# Patient Record
Sex: Male | Born: 1940 | Race: White | Hispanic: No | Marital: Married | Smoking: Former smoker
Health system: Southern US, Community
[De-identification: ages and names within clinical notes are randomized; demographics above are authoritative.]

## PROBLEM LIST (undated history)

## (undated) DIAGNOSIS — N189 Chronic kidney disease, unspecified: Secondary | ICD-10-CM

## (undated) DIAGNOSIS — M1990 Unspecified osteoarthritis, unspecified site: Secondary | ICD-10-CM

## (undated) DIAGNOSIS — Z8719 Personal history of other diseases of the digestive system: Secondary | ICD-10-CM

## (undated) DIAGNOSIS — I2510 Atherosclerotic heart disease of native coronary artery without angina pectoris: Secondary | ICD-10-CM

## (undated) DIAGNOSIS — I509 Heart failure, unspecified: Secondary | ICD-10-CM

## (undated) DIAGNOSIS — I639 Cerebral infarction, unspecified: Secondary | ICD-10-CM

## (undated) DIAGNOSIS — I4819 Other persistent atrial fibrillation: Secondary | ICD-10-CM

## (undated) DIAGNOSIS — C44201 Unspecified malignant neoplasm of skin of unspecified ear and external auricular canal: Secondary | ICD-10-CM

## (undated) DIAGNOSIS — E785 Hyperlipidemia, unspecified: Secondary | ICD-10-CM

## (undated) DIAGNOSIS — Z9989 Dependence on other enabling machines and devices: Secondary | ICD-10-CM

## (undated) DIAGNOSIS — G4733 Obstructive sleep apnea (adult) (pediatric): Secondary | ICD-10-CM

## (undated) DIAGNOSIS — I10 Essential (primary) hypertension: Secondary | ICD-10-CM

## (undated) DIAGNOSIS — I499 Cardiac arrhythmia, unspecified: Secondary | ICD-10-CM

## (undated) HISTORY — DX: Hyperlipidemia, unspecified: E78.5

## (undated) HISTORY — PX: MOHS SURGERY: SHX181

---

## 1996-03-16 HISTORY — PX: SHOULDER OPEN ROTATOR CUFF REPAIR: SHX2407

## 1998-11-01 ENCOUNTER — Ambulatory Visit (HOSPITAL_COMMUNITY): Admission: RE | Admit: 1998-11-01 | Discharge: 1998-11-01 | Payer: Self-pay | Admitting: Gastroenterology

## 2001-02-10 ENCOUNTER — Emergency Department (HOSPITAL_COMMUNITY): Admission: EM | Admit: 2001-02-10 | Discharge: 2001-02-10 | Payer: Self-pay | Admitting: Emergency Medicine

## 2001-02-21 ENCOUNTER — Emergency Department (HOSPITAL_COMMUNITY): Admission: EM | Admit: 2001-02-21 | Discharge: 2001-02-21 | Payer: Self-pay | Admitting: Emergency Medicine

## 2006-09-21 ENCOUNTER — Inpatient Hospital Stay (HOSPITAL_COMMUNITY): Admission: EM | Admit: 2006-09-21 | Discharge: 2006-09-22 | Payer: Self-pay | Admitting: Emergency Medicine

## 2006-09-21 ENCOUNTER — Ambulatory Visit: Payer: Self-pay | Admitting: *Deleted

## 2006-09-21 ENCOUNTER — Encounter (INDEPENDENT_AMBULATORY_CARE_PROVIDER_SITE_OTHER): Payer: Self-pay | Admitting: *Deleted

## 2010-07-29 NOTE — H&P (Signed)
NAME:  Austin Taylor, Austin Taylor NO.:  [id number]   MEDICAL RECORD NO.:  [id number]          PATIENT TYPE:  INP   LOCATION:  3731                         FACILITY:  [facility]   PHYSICIAN:  Elmore Guise., M.D.DATE OF BIRTH:  14-Apr-1940   DATE OF ADMISSION:  09/21/2006  DATE OF DISCHARGE:                              HISTORY & PHYSICAL   INDICATION FOR ADMISSION:  Chest pain.   HISTORY OF PRESENT ILLNESS:  Austin Taylor is a very pleasant 70 year old  white male, past medical history of dyslipidemia and obesity, who  presents with 24-hour history of off-and-on chest pain.  The patient  states his symptoms started yesterday morning as a sharp pain in his  left side with radiation to his shoulder.  He said his symptoms lasted  approximately 5 minutes and then resolved.  He denies any exertional  component to it.  No cough.  He does state that his symptoms actually  improved if he took a deep breath.  He had no shortness of breath or  nausea.  He stated that throughout the day he had a ache at his left  chest area that would just come and go.  He again reported that if he  would take a deep breath his symptoms would improve.  He had been very  active over the weekend, going to [place] and participating in a  [employer] War reenactment, there walking up to a mile at a time without any  significant problems.  On the trip home is when he first noticed a sharp  and stabbing type of chest pain.  He states he had no fever or cough.  He has had no orthopnea, no PND, no palpitations, no lower extremity  edema.  Otherwise, has been doing very, very good.   REVIEW OF SYSTEMS:  Are otherwise negative.   CURRENT MEDICATIONS:  Lipitor 40 mg daily.   ALLERGIES:  CODEINE.   FAMILY HISTORY:  Positive for heart disease with his father, mother and  brother.   SOCIAL HISTORY:  He is married.  He is a retired IT sales professional.  No  tobacco.  Occasional alcohol use.   EXAMINATION:  He is afebrile.   Temperature is 97.5, blood pressure is  140/70, heart rate 60, saturation 96% on room air.  GENERAL:  He is a very pleasant middle-aged white male, alert and  oriented x4.  No acute distress.  He has no JVD and no bruits.  LUNGS:  Clear.  HEART:  Regular with a normal S1, S2.  No murmurs, gallops or rubs.  ABDOMEN:  Obese, soft, nontender, nondistended.  No rebound or guarding.  EXTREMITIES:  Warm with 2+ pulses and no edema.  NEUROLOGIC:  Cranial nerves are intact.  Strength is 5/5 and equal  bilaterally.   Chest x-ray shows large hiatal hernia, otherwise no acute  cardiopulmonary disease.  ECG shows normal sinus rhythm, rate of 64 per  minute with first-degree AV block.  No significant ST or T-wave changes  noted.  His blood work shows a myoglobin of 104 and 77 and MB of 2.2 and  2.3,  and troponin I of 0.14 and less than 0.05 on the second draw.  His  BUN and creatinine are 11 and 1.0 with a potassium of 3.9.  His white  count is 5.8 with a hemoglobin of 14.4 and platelet count of 258.   IMPRESSION:  1. Chest pain (atypical).  2. Dyslipidemia.  3. Large hiatal hernia.  4. Strong family history of heart disease.   PLAN:  1. The patient be admitted for observation.  We will place on      telemetry monitoring.  He will have serial cardiac enzymes      performed.  I would like to check an echocardiogram to evaluate LV      function and see if he has any pericardial disease.  I do think      this is atypical.  However, with an abnormal first troponin I do      think observation is indicated.  I will continue his heparin drip      and nitroglycerin and start a proton pump inhibitor with Protonix      40 mg p.o. daily.  We will continue aspirin, Lopressor and Lipitor.      If his symptoms are totally resolved and further enzymes are      negative, then outpatient stress test would be acceptable.      Otherwise, if symptoms continue would plan for a cardiac      catheterization in a.m.   I discussed this with him and his family      at length.      Elmore Guise., M.D.  Electronically Signed     TWK/MEDQ  D:  09/21/2006  T:  09/21/2006  Job:  [id number]   cc:   Cassell Clement, M.D.

## 2010-07-29 NOTE — Discharge Summary (Signed)
NAME:  Austin Taylor, Austin Taylor NO.:  [id number]   MEDICAL RECORD NO.:  [id number]          PATIENT TYPE:  INP   LOCATION:  3731                         FACILITY:  [facility]   PHYSICIAN:  Elmore Guise., M.D.DATE OF BIRTH:  12/11/1940   DATE OF ADMISSION:  09/21/2006  DATE OF DISCHARGE:  09/22/2006                               DISCHARGE SUMMARY   DISCHARGE DIAGNOSES:  1. Atypical chest pain.  2. History of dyslipidemia.  3. Family history of heart disease.  4. Gastroesophageal reflux with large hiatal hernia.   HISTORY OF PRESENT ILLNESS:  Mr. Handley is a 70 year old, white male with  a past medical history of hiatal hernia and dyslipidemia who presented  with atypical chest pain.   HOSPITAL COURSE:  The patient was admitted for observation.  His  hospital course was uncomplicated.  He had serial cardiac enzymes  performed which were negative.  His EKG was normal.  He also had an echo  done while he was having chest aching that showed normal LV size and  function with an EF of 60% and no wall motion abnormalities.  He was  noted to have mild diastolic dysfunction.  He had no significant  valvular heart disease.  He has now been up and active with no  significant worsening with his symptoms.  He will be discharged later  today to have further outpatient workup.   DISCHARGE MEDICATIONS:  1. Lipitor 40 mg daily.  2. Multivitamin once daily.  3. Nitroglycerin 0.4 mg p.r.n.  4. Aspirin 325 mg daily.  5. Prilosec 20 mg once daily.  I did discuss anginal symptoms with      him.  His symptoms remained sharp with an ache and had no      significant exertional worsening.  I did discuss should he have any      symptoms that are worse with exertion, he is to give [place] a call.      Otherwise, I will see him tomorrow for the first part of his stress      test.  He may have a light breakfast and then n.p.o. after 9:00      a.m. for a stress test at 12:30.  This was explained to  him and his      wife in detail.      Elmore Guise., M.D.  Electronically Signed     TWK/MEDQ  D:  09/22/2006  T:  09/22/2006  Job:  [id number]   cc:   Molly Maduro A. Nicholos Johns, M.D.

## 2010-12-30 LAB — CBC
HCT: 37.4 — ABNORMAL LOW
HCT: 42.3
Hemoglobin: 12.7 — ABNORMAL LOW
MCHC: 33.8
MCHC: 34
MCV: 90.2
MCV: 90.6
RBC: 4.15 — ABNORMAL LOW
RBC: 4.68
WBC: 5.8

## 2010-12-30 LAB — I-STAT 8, (EC8 V) (CONVERTED LAB)
Acid-base deficit: 1
Chloride: 106
HCT: 46
Hemoglobin: 15.6
Operator id: 279831
Potassium: 3.9
Sodium: 142
TCO2: 27
pH, Ven: 7.342 — ABNORMAL HIGH

## 2010-12-30 LAB — COMPREHENSIVE METABOLIC PANEL
Albumin: 3.1 — ABNORMAL LOW
BUN: 11
CO2: 24
Calcium: 8.4
Chloride: 111
Creatinine, Ser: 0.84
GFR calc non Af Amer: >60
Total Bilirubin: 0.6

## 2010-12-30 LAB — CARDIAC PANEL(CRET KIN+CKTOT+MB+TROPI)
CK, MB: 3.1
Relative Index: 1.6
Relative Index: 1.9
Troponin I: 0.01

## 2010-12-30 LAB — PROTIME-INR
INR: 1
Prothrombin Time: 13.6

## 2010-12-30 LAB — POCT CARDIAC MARKERS
CKMB, poc: 1.5
Operator id: 279831
Troponin i, poc: <0.05
Troponin i, poc: <0.05

## 2010-12-30 LAB — DIFFERENTIAL
Basophils Relative: 1
Eosinophils Absolute: 0.2
Eosinophils Relative: 3
Lymphs Abs: 1.4
Monocytes Absolute: 0.4
Monocytes Relative: 7

## 2010-12-30 LAB — TROPONIN I: Troponin I: 0.01

## 2010-12-30 LAB — LIPID PANEL: VLDL: 11

## 2010-12-30 LAB — POCT I-STAT CREATININE
Creatinine, Ser: 1
Operator id: 279831

## 2010-12-30 LAB — CK TOTAL AND CKMB (NOT AT ARMC)
Relative Index: 1.9
Total CK: 198

## 2013-08-28 ENCOUNTER — Ambulatory Visit (INDEPENDENT_AMBULATORY_CARE_PROVIDER_SITE_OTHER): Payer: Medicare Other | Admitting: Podiatry

## 2013-08-28 ENCOUNTER — Encounter: Payer: Self-pay | Admitting: Podiatry

## 2013-08-28 VITALS — BP 131/75 | HR 66 | Resp 16

## 2013-08-28 DIAGNOSIS — M722 Plantar fascial fibromatosis: Secondary | ICD-10-CM

## 2013-08-28 MED ORDER — TRIAMCINOLONE ACETONIDE 10 MG/ML IJ SUSP
10.0000 mg | Freq: Once | INTRAMUSCULAR | Status: AC
  Administered 2013-08-28: 10 mg

## 2013-08-28 NOTE — Progress Notes (Signed)
Subjective:     Patient ID: Austin Taylor, male   DOB: 02/14/1941, 73 y.o.   MRN: [id number]  HPI patient presents stating that his left heel has started to hurt him quite a bit and into the arch with inflammation noted over the last 3 months   Review of Systems     Objective:   Physical Exam Neurovascular status intact with no other health history changes noted with discomfort in the plantar fascia left at the insertion of the tendon the calcaneus    Assessment:     Plantar fasciitis left heel with inflammation and fluid buildup    Plan:     H&P performed injection performed into the left heel 3 mg Kenalog 5 mg like Marcaine mixture and fascially brace dispensed with instructions on usage. Reappoint one week

## 2013-08-28 NOTE — Patient Instructions (Signed)

## 2013-09-06 ENCOUNTER — Encounter: Payer: Self-pay | Admitting: Podiatry

## 2013-09-06 ENCOUNTER — Ambulatory Visit (INDEPENDENT_AMBULATORY_CARE_PROVIDER_SITE_OTHER): Payer: Medicare Other | Admitting: Podiatry

## 2013-09-06 VITALS — BP 121/70 | HR 88 | Resp 16

## 2013-09-06 DIAGNOSIS — M722 Plantar fascial fibromatosis: Secondary | ICD-10-CM

## 2013-09-07 NOTE — Progress Notes (Signed)
Subjective:     Patient ID: Austin Taylor, male   DOB: November 02, 1940, 73 y.o.   MRN: [id number]  HPI patient states that the left heel is improving but still sore in the arch   Review of Systems     Objective:   Physical Exam Neurovascular status intact with no change in health history and noted to have mild discomfort in the plantar arch with the heel feeling very good upon palpation with no pain by patient    Assessment:     Improving plantar fasciitis with mild arch instability    Plan:     Discussed continued physical therapy and supportive shoe and possibility that we may need to do other treatments for this but at this time we're going to let ago and see how it does

## 2013-12-14 DIAGNOSIS — I639 Cerebral infarction, unspecified: Secondary | ICD-10-CM

## 2013-12-14 HISTORY — DX: Cerebral infarction, unspecified: I63.9

## 2013-12-27 ENCOUNTER — Emergency Department (HOSPITAL_COMMUNITY): Payer: Medicare Other

## 2013-12-27 ENCOUNTER — Encounter (HOSPITAL_COMMUNITY): Payer: Self-pay | Admitting: Emergency Medicine

## 2013-12-27 ENCOUNTER — Inpatient Hospital Stay (HOSPITAL_COMMUNITY): Payer: Medicare Other

## 2013-12-27 ENCOUNTER — Inpatient Hospital Stay (HOSPITAL_COMMUNITY)
Admission: EM | Admit: 2013-12-27 | Discharge: 2013-12-30 | DRG: 065 | Disposition: A | Discharge status: Home / Self Care | Payer: Medicare Other | Attending: Internal Medicine | Admitting: Internal Medicine

## 2013-12-27 ENCOUNTER — Observation Stay (HOSPITAL_COMMUNITY): Payer: Medicare Other

## 2013-12-27 DIAGNOSIS — Z5181 Encounter for therapeutic drug level monitoring: Secondary | ICD-10-CM

## 2013-12-27 DIAGNOSIS — I63341 Cerebral infarction due to thrombosis of right cerebellar artery: Secondary | ICD-10-CM | POA: Diagnosis present

## 2013-12-27 DIAGNOSIS — Z79899 Other long term (current) drug therapy: Secondary | ICD-10-CM

## 2013-12-27 DIAGNOSIS — Z7982 Long term (current) use of aspirin: Secondary | ICD-10-CM | POA: Diagnosis not present

## 2013-12-27 DIAGNOSIS — R609 Edema, unspecified: Secondary | ICD-10-CM | POA: Diagnosis present

## 2013-12-27 DIAGNOSIS — N182 Chronic kidney disease, stage 2 (mild): Secondary | ICD-10-CM

## 2013-12-27 DIAGNOSIS — Z7901 Long term (current) use of anticoagulants: Secondary | ICD-10-CM

## 2013-12-27 DIAGNOSIS — I5189 Other ill-defined heart diseases: Secondary | ICD-10-CM

## 2013-12-27 DIAGNOSIS — E669 Obesity, unspecified: Secondary | ICD-10-CM | POA: Diagnosis present

## 2013-12-27 DIAGNOSIS — K219 Gastro-esophageal reflux disease without esophagitis: Secondary | ICD-10-CM | POA: Diagnosis present

## 2013-12-27 DIAGNOSIS — I484 Atypical atrial flutter: Secondary | ICD-10-CM

## 2013-12-27 DIAGNOSIS — R42 Dizziness and giddiness: Secondary | ICD-10-CM | POA: Diagnosis not present

## 2013-12-27 DIAGNOSIS — I639 Cerebral infarction, unspecified: Secondary | ICD-10-CM

## 2013-12-27 DIAGNOSIS — I129 Hypertensive chronic kidney disease with stage 1 through stage 4 chronic kidney disease, or unspecified chronic kidney disease: Secondary | ICD-10-CM | POA: Diagnosis present

## 2013-12-27 DIAGNOSIS — Z87891 Personal history of nicotine dependence: Secondary | ICD-10-CM

## 2013-12-27 DIAGNOSIS — I6330 Cerebral infarction due to thrombosis of unspecified cerebral artery: Secondary | ICD-10-CM

## 2013-12-27 DIAGNOSIS — I4892 Unspecified atrial flutter: Secondary | ICD-10-CM

## 2013-12-27 DIAGNOSIS — I10 Essential (primary) hypertension: Secondary | ICD-10-CM

## 2013-12-27 DIAGNOSIS — E785 Hyperlipidemia, unspecified: Secondary | ICD-10-CM

## 2013-12-27 DIAGNOSIS — Z6830 Body mass index (BMI) 30.0-30.9, adult: Secondary | ICD-10-CM | POA: Diagnosis not present

## 2013-12-27 LAB — PRO B NATRIURETIC PEPTIDE: PRO B NATRI PEPTIDE: 274.9 pg/mL — AB (ref 0–125)

## 2013-12-27 LAB — BASIC METABOLIC PANEL
Anion gap: 10 (ref 5–15)
BUN: 13 mg/dL (ref 6–23)
CHLORIDE: 105 meq/L (ref 96–112)
CO2: 25 mEq/L (ref 19–32)
Calcium: 8.6 mg/dL (ref 8.4–10.5)
Creatinine, Ser: 0.96 mg/dL (ref 0.50–1.35)
GFR calc Af Amer: >90 mL/min (ref >90)
GFR calc non Af Amer: 80 mL/min — ABNORMAL LOW (ref >90)
Glucose, Bld: 106 mg/dL — ABNORMAL HIGH (ref 70–99)
POTASSIUM: 4.2 meq/L (ref 3.7–5.3)
Sodium: 140 mEq/L (ref 137–147)

## 2013-12-27 LAB — RAPID URINE DRUG SCREEN, HOSP PERFORMED
Amphetamines: NOT DETECTED
Barbiturates: NOT DETECTED
Benzodiazepines: NOT DETECTED
Cocaine: NOT DETECTED
Opiates: NOT DETECTED
TETRAHYDROCANNABINOL: NOT DETECTED

## 2013-12-27 LAB — CBC
HCT: 41.4 % (ref 39.0–52.0)
Hemoglobin: 14.2 g/dL (ref 13.0–17.0)
MCH: 30.5 pg (ref 26.0–34.0)
MCHC: 34.3 g/dL (ref 30.0–36.0)
MCV: 89 fL (ref 78.0–100.0)
Platelets: 204 10*3/uL (ref 150–400)
RBC: 4.65 MIL/uL (ref 4.22–5.81)
RDW: 13.5 % (ref 11.5–15.5)
WBC: 6.9 10*3/uL (ref 4.0–10.5)

## 2013-12-27 LAB — CBG MONITORING, ED: Glucose-Capillary: 108 mg/dL — ABNORMAL HIGH (ref 70–99)

## 2013-12-27 LAB — TROPONIN I: Troponin I: <0.3 ng/mL (ref <0.30)

## 2013-12-27 MED ORDER — ASPIRIN 300 MG RE SUPP: 300.0000 mg | Freq: Every day | RECTAL | Status: DC

## 2013-12-27 MED ORDER — ASPIRIN 325 MG PO TABS
325.0000 mg | ORAL_TABLET | Freq: Every day | ORAL | Status: DC
  Administered 2013-12-28: 325 mg via ORAL
  Filled 2013-12-27: qty 1

## 2013-12-27 MED ORDER — SODIUM CHLORIDE 0.9 % IJ SOLN: 3.0000 mL | INTRAMUSCULAR | Status: DC | PRN

## 2013-12-27 MED ORDER — SODIUM CHLORIDE 0.9 % IV SOLN
250.0000 mL | INTRAVENOUS | Status: DC | PRN
  Administered 2013-12-29: 250 mL via INTRAVENOUS

## 2013-12-27 MED ORDER — STROKE: EARLY STAGES OF RECOVERY BOOK
Freq: Once | Status: AC
  Administered 2013-12-27: 22:00:00
  Filled 2013-12-27: qty 1

## 2013-12-27 MED ORDER — SENNOSIDES-DOCUSATE SODIUM 8.6-50 MG PO TABS
1.0000 | ORAL_TABLET | Freq: Every evening | ORAL | Status: DC | PRN
  Administered 2013-12-29: 1 via ORAL
  Filled 2013-12-27: qty 1

## 2013-12-27 MED ORDER — OXYCODONE HCL 5 MG PO TABS
5.0000 mg | ORAL_TABLET | ORAL | Status: DC | PRN
  Administered 2013-12-27 – 2013-12-28 (×2): 5 mg via ORAL
  Filled 2013-12-27 (×2): qty 1

## 2013-12-27 MED ORDER — SODIUM CHLORIDE 0.9 % IJ SOLN
3.0000 mL | Freq: Two times a day (BID) | INTRAMUSCULAR | Status: DC
  Administered 2013-12-28 – 2013-12-30 (×4): 3 mL via INTRAVENOUS

## 2013-12-27 MED ORDER — ACETAMINOPHEN 325 MG PO TABS
650.0000 mg | ORAL_TABLET | ORAL | Status: DC | PRN
  Administered 2013-12-29 (×2): 650 mg via ORAL
  Filled 2013-12-27 (×2): qty 2

## 2013-12-27 MED ORDER — ACETAMINOPHEN 650 MG RE SUPP: 650.0000 mg | RECTAL | Status: DC | PRN

## 2013-12-27 NOTE — Progress Notes (Signed)
Pt arrived to unit room 4N20 alert, verbal oriented x4  with no complaints voiced.  Family at bedside. He denies pain or discomfort.  No noted distress.  Passed swallow screening on Heart Healthy diet. CCMD called pt on cardiac monitoring at this time.  Pt stable, safety measures in place and reviewed with pt and family.  Call bell within reach.  Called attending Md who stated  he would be in to review pt orders in an hour.  No new verbal orders received for  pt at this time.  Will continue to monitor.

## 2013-12-27 NOTE — Consult Note (Signed)
Referring Physician: Dr. Maryland Pink    Chief Complaint: unsteadiness  HPI: Austin Taylor is an 73 y.o. male pmh of HLD and IDA 2/2 hiatal hernia p/w unsteadiness. The patient is accompanied by wife, son, brother and sister in law whom all help provide the history. Pt felt normal when he went to bed 2330 but had complained of some nausea. He woke up at his normal time 0600 but still had some nausea and felt unsteady on his feet. He denied any numbness/tingling, weakness, HA, blurry vision, CP, or SOB. He took all his medications that included an ASA 81 mg on 12/26/13. The patient goes for a daily 2 mile walk with his brother and started to drive and felt he didn't have full control of his feet and still "not normal" and therefore decided to return home. He said he was able to transfer out of the car without assistance and didn't fall but still felt unsteady "like I needed to hold on to someone/something for stability." His wife then saw him and noticed that his speech was slow or slightly slurred she then called her son when the decision was made to come to ED. Pt has never had any similar episodes in the past and has no personal hx of MI, TIA, or stroke. He was a former smoker ~1ppd and quit in 1973 and only occasionally drinks EtOH. Pt has extensive family hx of father CABG and CAE in his 35s and mother with CHF. During this interview pt feels he is nearly back to his baseline.   Time last known well: 12/26/13 @ 2330 tPA Given: No: outside tpa window  Past Medical History  Diagnosis Date  . Hyperlipidemia     History reviewed. No pertinent past surgical history.  Social History:  reports that he has quit smoking. He does not have any smokeless tobacco history on file. He reports that he drinks alcohol. He reports that he does not use illicit drugs.  Allergies:  Allergies  Allergen Reactions  . Codeine     Nausea     Medications: Lasix, FeSO4 (MWF), ASA 81mg , simvastatin  ROS: complete 14  pt ROS completed and pertinent listed in HPI  Physical Examination: Blood pressure 142/87, pulse 55, temperature 97.6 F (36.4 C), temperature source Oral, resp. rate 17, height 6' (1.829 m), weight 230 lb (104.327 kg), SpO2 97.00%.  General: resting in bed, NAD HEENT: PERRL, EOMI, no scleral icterus, MMM Cardiac: RRR, no rubs, murmurs or gallops, no carotid bruits Pulm: clear to auscultation bilaterally, moving normal volumes of air Abd: soft, nontender, nondistended, BS present Ext: warm and well perfused, no pedal edema Neuro:  Mental Status: Alert, oriented, thought content appropriate.  Speech fluent without evidence of aphasia.  Able to follow 3 step commands without difficulty. Cranial Nerves: II: Discs flat bilaterally; Visual fields grossly normal, pupils equal, round, reactive to light and accommodation III,IV, VI: ptosis not present, extra-ocular motions intact bilaterally V,VII: smile symmetric, facial light touch sensation normal bilaterally VIII: hearing normal bilaterally IX,X: gag reflex present XI: bilateral shoulder shrug XII: midline tongue extension without atrophy or fasciculations  Motor: Right : Upper extremity   5/5    Left:     Upper extremity   5/5  Lower extremity   5/5     Lower extremity   5/5 Tone and bulk:normal tone throughout; no atrophy noted  Sensory: Pinprick and light touch intact throughout, bilaterally Deep Tendon Reflexes:  Right: Upper Extremity   Left: Upper extremity  biceps (C-5 to C-6) 2/4   biceps (C-5 to C-6) 2/4 tricep (C7) 2/4    triceps (C7) 2/4 Brachioradialis (C6) 2/4  Brachioradialis (C6) 2/4  Lower Extremity Lower Extremity  quadriceps (L-2 to L-4) 2/4   quadriceps (L-2 to L-4) 2/4 Achilles (S1) 2/4   Achilles (S1) 2/4  Plantars: Right: downgoing   Left: downgoing Cerebellar: normal finger-to-nose,  normal heel-to-shin test, negative romberg, normal rapid alternating movements  Gait: normal   Laboratory Studies:   Basic Metabolic Panel:  Recent Labs Lab 12/27/13 0825  NA 140  K 4.2  CL 105  CO2 25  GLUCOSE 106*  BUN 13  CREATININE 0.96  CALCIUM 8.6   CBC:  Recent Labs Lab 12/27/13 0825  WBC 6.9  HGB 14.2  HCT 41.4  MCV 89.0  PLT 204   CBG:  Recent Labs Lab 12/27/13 0841  GLUCAP 108*   Lipid Panel:    Component Value Date/Time   CHOL  Value: 96        ATP III CLASSIFICATION:  <200     mg/dL   Desirable  200-239  mg/dL   Borderline High  >=240    mg/dL   High 09/21/2006 1139   TRIG 55 09/21/2006 1139   HDL 33* 09/21/2006 1139   CHOLHDL 2.9 09/21/2006 1139   VLDL 11 09/21/2006 1139   LDLCALC  Value: 52        Total Cholesterol/HDL:CHD Risk Coronary Heart Disease Risk Table                     Men   Women  1/2 Average Risk   3.4   3.3 09/21/2006 1139   Other results: EKG: there are no previous tracings available for comparison, normal sinus rhythm, prolonged PR (271).  Imaging: Ct Head Wo Contrast  12/27/2013   CLINICAL DATA:  Unsteady gait.  Dizziness.  EXAM: CT HEAD WITHOUT CONTRAST  TECHNIQUE: Contiguous axial images were obtained from the base of the skull through the vertex without intravenous contrast.  COMPARISON:  None.  FINDINGS: No acute cortical infarct, hemorrhage, or mass lesion is present. The ventricles are of normal size. No significant extra-axial fluid collection is evident. The paranasal sinuses and mastoid air cells are clear. The osseous skull is intact.  IMPRESSION: Negative CT of the head.   Electronically Signed   By: Lawrence Santiago M.D.   On: 12/27/2013 09:00    Assessment: 73 y.o. male pmh of HLD and IDA p/w unsteadiness concern for TIA vs stroke. Pt has had resolution of symptoms at the time of this evaluation.  Stroke Risk Factors - family history and hyperlipidemia  Plan: 1. HgbA1c, fasting lipid panel 2. MRI, MRA  of the brain without contrast 3. PT consult, OT consult, Speech consult 4. Echocardiogram 5. Prophylactic therapy-Antiplatelet med: Aspirin  - dose 325mg   6. Telemetry monitoring 7. Frequent neuro checks  Clinton Gallant, MD  IM PGY-3 Pgr: [phone] 12/27/2013, 10:20 AM   I have seen and evaluated the patient. I have reviewed the above note and agree with the above.    73 yo M with unsteadiness present on awakening. He has a non-focal exam at this point, but does have cerebellar infarct on MRI.   He does not smoke anymore. Father had a stroke.   He is awake, alert, oriented to person, year, place, month.  No papilledema. CN II - XII intact. No ataxia, no focal weakness. DTR 2+ and symmetric. No numbness. Negative romberg.  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 He will need a stroke workup including posterior circulation evaluation.   Recommendations as above with the addition of MRA neck with contrast.   Roland Rack, MD [facility] [phone]  If 7pm- 7am, please page neurology on call as listed in [facility].

## 2013-12-27 NOTE — Progress Notes (Signed)
Attempting to page Maryland Pink or [place] via operator because patient does not have any medications listed. Will continue to monitor closely and accordingly.

## 2013-12-27 NOTE — ED Notes (Signed)
EMS - Pt coming from home with c/o of dizziness upon awaking this morning.  Pt LKN was 11:30pm on 12/26/2013.  Pt states when he woke up he felt really wobble but was proceeding with his day, got in the car and went about 1 mile up the road and turned around due to feeling really dizzy.  Pt states he "never had the room spinning feeling", just felt like he needed to hold on to something to prevent self from falling.  BP 160/80, pulse of 56 and CBG 112.

## 2013-12-27 NOTE — H&P (Signed)
History and Physical  Austin Taylor PPJ:[id number] DOB: 10-28-1940 DOA: 12/27/2013  Referring physician: Evelina Bucy, ER physician PCP: Vena Austria, MD   Chief Complaint: Dizziness  HPI: Austin Taylor is a 73 y.o. male  With past medical history of hyperlipidemia but otherwise quite healthy was in his usual state of health when he woke up in this morning feeling quite nauseated and a little bit dizzy. while walking to the bathroom. He denied any headache, vision changes or shortness of breath. He planned to meet with his brother, but still did not feel right and felt like his legs are not under his control. He felt unsteady. His wife felt like his speech was a little bit slurred, but this resolved by the time he was in the emergency room He called his son who is an EMT who advised him to call the paramedics and patient was brought into the emergency room. In the emergency room, CT scan of the head was unremarkable, but MRI confirmed cerebellar acute infarct. Patient seen by neurology. Hospitalists were called for further evaluation and admission. On arrival to the floor, patient's unsteadiness resolved. He does not complain of a mild headache. He has been taking all of his medicines including daily aspirin 81 mg by mouth daily.   Review of Systems:  Patient seen after arrival to floor. Pt complains of mild headache.  Pt denies any vision changes, dysphagia, chest pain, palpitations, shortness of breath, wheeze, cough, abdominal pain, hematuria, dysuria, constipation, diarrhea, focal extremity numbness or weakness or pain. Review of systems is otherwise negative..  Review of systems are otherwise negative  Past Medical History  Diagnosis Date  . Hyperlipidemia    acid reflux with hiatal hernia  History reviewed. No pertinent past surgical history. Social History:  reports that he has quit smoking. He does not have any smokeless tobacco history on file. He reports that he  drinks alcohol. He reports that he does not use illicit drugs. Patient lives at home & is quite active, he able to participate in activities of daily living with out assistance  Allergies  Allergen Reactions  . Codeine     Nausea      family history: Dad with heart disease, carotid artery stenosis  Prior to Admission medications   Medication Sig Start Date End Date Taking? Authorizing Provider  aspirin 81 MG tablet Take 81 mg by mouth daily.   Yes Historical Provider, MD  ferrous sulfate 325 (65 FE) MG tablet Take 325 mg by mouth daily with breakfast.   Yes Historical Provider, MD  furosemide (LASIX) 20 MG tablet Take 20-40 mg by mouth daily as needed (for sweling).   Yes Historical Provider, MD  simvastatin (ZOCOR) 40 MG tablet Take 40 mg by mouth daily.   Yes Historical Provider, MD    Physical Exam: BP 159/81  Pulse 60  Temp(Src) 98 F (36.7 C) (Oral)  Resp 20  Ht 6' (1.829 m)  Wt 104.327 kg (230 lb)  BMI 31.19 kg/m2  SpO2 98%  General:  Alert and oriented x3, no acute distress Eyes: Sclera nonicteric, extraocular movements are intact ENT: Normocephalic, atraumatic, mucous members are moist Neck: No carotid bruits, no JVD Cardiovascular: Regular rate and rhythm, S1-S2 Respiratory: Clear to auscultation bilaterally Abdomen: Soft, nontender, nondistended, positive bowel sounds Skin: No skin breaks, tears or lesions Musculoskeletal: No clubbing or cyanosis or edema, strength is symmetric for grip, flexion and extension upper and lower Psychiatric: Patient is appropriate, no evidence of psychoses Neurologic:  No focal deficits. Finger to nose is normal. No dysmetria. Patient has steady gait. No abnormalities. Downgoing toes.           Labs on Admission:  Basic Metabolic Panel:  Recent Labs Lab 12/27/13 0825  NA 140  K 4.2  CL 105  CO2 25  GLUCOSE 106*  BUN 13  CREATININE 0.96  CALCIUM 8.6   Liver Function Tests: No results found for this basename: AST, ALT,  ALKPHOS, BILITOT, PROT, ALBUMIN,  in the last 168 hours No results found for this basename: LIPASE, AMYLASE,  in the last 168 hours No results found for this basename: AMMONIA,  in the last 168 hours CBC:  Recent Labs Lab 12/27/13 0825  WBC 6.9  HGB 14.2  HCT 41.4  MCV 89.0  PLT 204   Cardiac Enzymes:  Recent Labs Lab 12/27/13 0825  TROPONINI <0.30    BNP (last 3 results)  Recent Labs  12/27/13 0825  PROBNP 274.9*   CBG:  Recent Labs Lab 12/27/13 0841  GLUCAP 108*    Radiological Exams on Admission: Ct Head Wo Contrast  12/27/2013     IMPRESSION: Negative CT of the head.   Electronically Signed   By: Lawrence Santiago M.D.   On: 12/27/2013 09:00   Mr Virgel Paling ZO Contrast Mr Brain Wo Contrast  12/27/2013    IMPRESSION: Two areas of acute infarction within the right cerebellum, possibly of slightly different age. No evidence of significant swelling, hemorrhage or mass effect. No other acute infarction.  Mild chronic small-vessel ischemia within the cerebral hemispheric white matter.  No posterior circulation abnormality identified at MR angiography.   Electronically Signed   By: Nelson Chimes M.D.   On: 12/27/2013 11:32    EKG: Independently reviewed. Sinus rhythm with mild prolonged PR interval  Assessment/Plan Present on Admission:  . Cerebral thrombosis with cerebellar infarction: Started on aspirin. Stroke workup in progress including MRA of neck, carotid Dopplers and 2-D echocardiogram plus lipid panel and A1c. PT and OT to see. On aspirin 325 daily  . Hyperlipidemia: Continue statin. Check fasting lipid panel.  Marland Kitchen GERD (gastroesophageal reflux disease) . CKD (chronic kidney disease) stage 2, GFR 60-89 ml/min: Minimal, at baseline. Obesity: Patient meets criteria of BMI greater than 30  . ? Diastolic dysfunction: Interestingly, patient had an echocardiogram done in 2008 which does not formally give a diagnosis of diastolic dysfunction, but does, comment on an  abnormal filling pattern. Echocardiogram ordered  Consultants: Neurology  Code Status: Full code  Family Communication: Wife at the bedside   Disposition Plan: Here for several days well. Up to determine. PT and OT to determine disposition.  Time spent: 35 minutes  [facility] Pager [phone]

## 2013-12-27 NOTE — ED Notes (Signed)
Dr. Mingo Amber at [facility] with neuro checks.

## 2013-12-27 NOTE — ED Notes (Signed)
Patient transported to MRI 

## 2013-12-27 NOTE — ED Provider Notes (Signed)
CSN: [id number]     Arrival date & time 12/27/13  0808 History   First MD Initiated Contact with Patient 12/27/13 [phone]     Chief Complaint  Patient presents with  . Dizziness     (Consider location/radiation/quality/duration/timing/severity/associated sxs/prior Treatment) Patient is a 73 y.o. male presenting with dizziness. The history is provided by the patient.  Dizziness Quality:  Imbalance Severity:  Mild Onset quality:  Sudden Timing:  Constant Progression:  Unchanged Chronicity:  New Context: physical activity   Relieved by:  Nothing Worsened by:  Nothing tried Associated symptoms: nausea   Associated symptoms: no chest pain, no headaches, no shortness of breath, no vision changes and no vomiting   Associated symptoms comment:  Wife reported some speech difficulty earlier this morning that has since cleared up   Past Medical History  Diagnosis Date  . Hyperlipidemia    History reviewed. No pertinent past surgical history. No family history on file. History  Substance Use Topics  . Smoking status: Former Research scientist (life sciences)  . Smokeless tobacco: Not on file  . Alcohol Use: Yes     Comment: occassional    Review of Systems  Constitutional: Negative for fever.  Respiratory: Negative for cough and shortness of breath.   Cardiovascular: Negative for chest pain and leg swelling.  Gastrointestinal: Positive for nausea. Negative for vomiting and abdominal pain.  Neurological: Positive for dizziness. Negative for headaches.  All other systems reviewed and are negative.     Allergies  Codeine  Home Medications   Prior to Admission medications   Medication Sig Start Date End Date Taking? Authorizing Provider  aspirin 81 MG tablet Take 81 mg by mouth daily.    Historical Provider, MD  ferrous sulfate 325 (65 FE) MG tablet Take 325 mg by mouth daily with breakfast.    Historical Provider, MD  simvastatin (ZOCOR) 40 MG tablet Take 40 mg by mouth daily.    Historical Provider,  MD   BP 155/84  Pulse 56  Temp(Src) 97.6 F (36.4 C) (Oral)  Resp 16  Ht 6' (1.829 m)  Wt 230 lb (104.327 kg)  BMI 31.19 kg/m2  SpO2 96% Physical Exam  Nursing note and vitals reviewed. Constitutional: He is oriented to person, place, and time. He appears well-developed and well-nourished. No distress.  HENT:  Head: Normocephalic and atraumatic.  Mouth/Throat: No oropharyngeal exudate.  Eyes: EOM are normal. Pupils are equal, round, and reactive to light.  Neck: Normal range of motion. Neck supple.  Cardiovascular: Normal rate and regular rhythm.  Exam reveals no friction rub.   No murmur heard. Pulmonary/Chest: Effort normal and breath sounds normal. No respiratory distress. He has no wheezes. He has no rales.  Abdominal: He exhibits no distension. There is no tenderness. There is no rebound.  Musculoskeletal: Normal range of motion. He exhibits no edema.  Neurological: He is alert and oriented to person, place, and time. No cranial nerve deficit. He exhibits normal muscle tone. Coordination normal. GCS eye subscore is 4. GCS verbal subscore is 5. GCS motor subscore is 6.  Positive Romberg  Skin: He is not diaphoretic.    ED Course  Procedures (including critical care time) Labs Review Labs Reviewed  CBC  BASIC METABOLIC PANEL  CBG MONITORING, ED    Imaging Review Ct Head Wo Contrast  12/27/2013   CLINICAL DATA:  Unsteady gait.  Dizziness.  EXAM: CT HEAD WITHOUT CONTRAST  TECHNIQUE: Contiguous axial images were obtained from the base of the skull through the vertex  without intravenous contrast.  COMPARISON:  None.  FINDINGS: No acute cortical infarct, hemorrhage, or mass lesion is present. The ventricles are of normal size. No significant extra-axial fluid collection is evident. The paranasal sinuses and mastoid air cells are clear. The osseous skull is intact.  IMPRESSION: Negative CT of the head.   Electronically Signed   By: Lawrence Santiago M.D.   On: 12/27/2013 09:00    Mr Jodene Nam Head Wo Contrast  12/27/2013   CLINICAL DATA:  Dizziness.  Unsteady gait.  Symptoms of acute onset.  EXAM: MRI HEAD WITHOUT CONTRAST  MRA HEAD WITHOUT CONTRAST  TECHNIQUE: Multiplanar, multiecho pulse sequences of the brain and surrounding structures were obtained without intravenous contrast. Angiographic images of the head were obtained using MRA technique without contrast.  COMPARISON:  Head CT same day  FINDINGS: MRI HEAD FINDINGS  There are patchy areas of acute infarction affecting the right cerebellar hemisphere. These could possibly be of differing age. No involvement of the brainstem or left cerebellum. No old insults are evident. The cerebral hemispheres show minimal small vessel change of the deep white matter. No cortical or large vessel territory infarction. No mass lesion, hemorrhage, hydrocephalus or extra-axial collection. No pituitary mass. No inflammatory sinus disease. No skull or skullbase lesion.  MRA HEAD FINDINGS  Both internal carotid arteries are widely patent into the brain. The anterior and middle cerebral vessels are patent without proximal stenosis, aneurysm or vascular malformation.  Both vertebral arteries are widely patent to the basilar, approximately equal in size. No basilar stenosis. Both posterior inferior cerebellar arteries show flow. Both superior cerebellar arteries show flow. Both posterior cerebral arteries are patent, with the right having a fetal origin from the anterior circulation.  IMPRESSION: Two areas of acute infarction within the right cerebellum, possibly of slightly different age. No evidence of significant swelling, hemorrhage or mass effect. No other acute infarction.  Mild chronic small-vessel ischemia within the cerebral hemispheric white matter.  No posterior circulation abnormality identified at MR angiography.   Electronically Signed   By: Nelson Chimes M.D.   On: 12/27/2013 11:32   Mr Brain Wo Contrast  12/27/2013   CLINICAL DATA:   Dizziness.  Unsteady gait.  Symptoms of acute onset.  EXAM: MRI HEAD WITHOUT CONTRAST  MRA HEAD WITHOUT CONTRAST  TECHNIQUE: Multiplanar, multiecho pulse sequences of the brain and surrounding structures were obtained without intravenous contrast. Angiographic images of the head were obtained using MRA technique without contrast.  COMPARISON:  Head CT same day  FINDINGS: MRI HEAD FINDINGS  There are patchy areas of acute infarction affecting the right cerebellar hemisphere. These could possibly be of differing age. No involvement of the brainstem or left cerebellum. No old insults are evident. The cerebral hemispheres show minimal small vessel change of the deep white matter. No cortical or large vessel territory infarction. No mass lesion, hemorrhage, hydrocephalus or extra-axial collection. No pituitary mass. No inflammatory sinus disease. No skull or skullbase lesion.  MRA HEAD FINDINGS  Both internal carotid arteries are widely patent into the brain. The anterior and middle cerebral vessels are patent without proximal stenosis, aneurysm or vascular malformation.  Both vertebral arteries are widely patent to the basilar, approximately equal in size. No basilar stenosis. Both posterior inferior cerebellar arteries show flow. Both superior cerebellar arteries show flow. Both posterior cerebral arteries are patent, with the right having a fetal origin from the anterior circulation.  IMPRESSION: Two areas of acute infarction within the right cerebellum, possibly of slightly different  age. No evidence of significant swelling, hemorrhage or mass effect. No other acute infarction.  Mild chronic small-vessel ischemia within the cerebral hemispheric white matter.  No posterior circulation abnormality identified at MR angiography.   Electronically Signed   By: Nelson Chimes M.D.   On: 12/27/2013 11:32     EKG Interpretation   Date/Time:  Wednesday December 27 2013 08:16:34 EDT Ventricular Rate:  57 PR Interval:   271 QRS Duration: 88 QT Interval:  475 QTC Calculation: 462 R Axis:   10 Text Interpretation:  Sinus rhythm Prolonged PR interval Probable  anteroseptal infarct, old Sinus bradycardia, no prior for comparison  Confirmed by [facility]  MD, Tome ([id number]) on 12/27/2013 8:29:35 AM      MDM   Final diagnoses:  Cerebellar stroke  Dizziness    65M here with dizziness. Feels off-balance, woke up with this feeling, went to bed last night, was normal at that time. No falls, no spinning dizziness, no CP or SOB. Here with stable vitals, normal orthostatics. Positive Romberg on Neuro exam, had no problems with gait. Normal strength and sensation. Wife reported some difficulty with speech earlier. With wake up symptoms, no last known normal, does not meet criteria for Code Stroke.  Will start with Head CT, labs. EKG ok.  CT head ok. MR ordered. Dr. Leonel Ramsay with Neuro will evaluate. Plan for admission. MR confirms cerebellar stroke. I have reviewed all labs and imaging and considered them in my medical decision making.   Evelina Bucy, MD 12/27/13 1311

## 2013-12-28 ENCOUNTER — Inpatient Hospital Stay (HOSPITAL_COMMUNITY): Payer: Medicare Other

## 2013-12-28 ENCOUNTER — Other Ambulatory Visit: Payer: Self-pay | Admitting: Neurology

## 2013-12-28 ENCOUNTER — Encounter (HOSPITAL_COMMUNITY): Payer: Self-pay | Admitting: Nurse Practitioner

## 2013-12-28 DIAGNOSIS — N182 Chronic kidney disease, stage 2 (mild): Secondary | ICD-10-CM

## 2013-12-28 DIAGNOSIS — I6330 Cerebral infarction due to thrombosis of unspecified cerebral artery: Secondary | ICD-10-CM

## 2013-12-28 DIAGNOSIS — I059 Rheumatic mitral valve disease, unspecified: Secondary | ICD-10-CM

## 2013-12-28 DIAGNOSIS — E785 Hyperlipidemia, unspecified: Secondary | ICD-10-CM

## 2013-12-28 LAB — LIPID PANEL
Cholesterol: 128 mg/dL (ref 0–200)
HDL: 38 mg/dL — ABNORMAL LOW (ref >39)
LDL Cholesterol: 68 mg/dL (ref 0–99)
Total CHOL/HDL Ratio: 3.4 RATIO
Triglycerides: 109 mg/dL (ref <150)
VLDL: 22 mg/dL (ref 0–40)

## 2013-12-28 LAB — HEMOGLOBIN A1C
Hgb A1c MFr Bld: 6.3 % — ABNORMAL HIGH (ref <5.7)
Mean Plasma Glucose: 134 mg/dL — ABNORMAL HIGH (ref <117)

## 2013-12-28 LAB — VITAMIN B12: Vitamin B-12: 579 pg/mL (ref 211–911)

## 2013-12-28 MED ORDER — GADOBENATE DIMEGLUMINE 529 MG/ML IV SOLN
20.0000 mL | Freq: Once | INTRAVENOUS | Status: AC | PRN
Start: 2013-12-28 — End: 2013-12-28
  Administered 2013-12-28: 20 mL via INTRAVENOUS

## 2013-12-28 MED ORDER — SIMVASTATIN 40 MG PO TABS
40.0000 mg | ORAL_TABLET | Freq: Every day | ORAL | Status: DC
  Administered 2013-12-28: 40 mg via ORAL
  Filled 2013-12-28: qty 1

## 2013-12-28 MED ORDER — CLOPIDOGREL BISULFATE 75 MG PO TABS
75.0000 mg | ORAL_TABLET | Freq: Every day | ORAL | Status: DC
  Administered 2013-12-28 – 2013-12-29 (×2): 75 mg via ORAL
  Filled 2013-12-28 (×2): qty 1

## 2013-12-28 NOTE — Progress Notes (Signed)
PT Cancellation Note  Patient Details Name: CLEVEN JANSMA MRN: [id number] DOB: [birth year]   Cancelled Treatment:    Reason Eval Not Completed: Patient unavailable (with another clinician). Will attempt later today, although appears per OT evaluation that pt is at his baseline.   Kendyl Festa 12/28/2013, 1:50 PM Pager [phone]

## 2013-12-28 NOTE — Progress Notes (Signed)
  Echocardiogram 2D Echocardiogram has been performed.  Austin Taylor 12/28/2013, 1:13 PM

## 2013-12-28 NOTE — Progress Notes (Signed)
Occupational Therapy Evaluation Patient Details Name: BRANCH PACITTI MRN: [id number] DOB: 06/23/1940 Today's Date: 12/28/2013    History of Present Illness 73 yo who presented with balance deficits. MRI + 2 small areas of acute infarct R cerebellum   Clinical Impression   PTA, pt independent with all ADL and mobility. Pt feels he is close to baseline. Pt does not demonstrate any significant balance or coordination deficits affecting ADL and mobility for ADL. Pt safe to D/C home with S of wife when medically stable. Educated on warning signs/symptoms of CVA using FAST. OT signing off. Thanks,    Follow Up Recommendations  No OT follow up;Supervision - Intermittent    Equipment Recommendations  None recommended by OT    Recommendations for Other Services       Precautions / Restrictions Precautions Precautions: None      Mobility Bed Mobility Overal bed mobility: Independent                Transfers Overall transfer level: Independent                    Balance Overall balance assessment: Modified Independent                               Standardized Balance Assessment Standardized Balance Assessment : Dynamic Gait Index   Dynamic Gait Index Level Surface: Normal Change in Gait Speed: Normal Gait with Horizontal Head Turns: Normal Gait with Vertical Head Turns: Normal Gait and Pivot Turn: Normal Step Over Obstacle: Normal Step Around Obstacles: Normal Steps:  (not assessed) - defer to PT      ADL Overall ADL's : At baseline                                       General ADL Comments: Educated on recommendation to use built in shower seat until he feels "100%". discussed home safety. Educated on warning signs/symptoms of CVA     Vision Eye Alignment: Within Functional Limits Alignment/Gaze Preference: Within Defined Limits Ocular Range of Motion: Within Functional Limits Tracking/Visual Pursuits: Able to track  stimulus in all quads without difficulty Saccades: Within functional limits Convergence: Within functional limits         Perception     Praxis Praxis Praxis tested?: Within functional limits    Pertinent Vitals/Pain Pain Assessment: No/denies pain     Hand Dominance Right   Extremity/Trunk Assessment Upper Extremity Assessment Upper Extremity Assessment: Overall WFL for tasks assessed  coordination [facility] Lower Extremity Assessment Lower Extremity Assessment: Defer to PT evaluation   Cervical / Trunk Assessment Cervical / Trunk Assessment: Normal   Communication Communication Communication: No difficulties   Cognition Arousal/Alertness: Awake/alert Behavior During Therapy: WFL for tasks assessed/performed Overall Cognitive Status: Within Functional Limits for tasks assessed                     General Comments       Exercises       Shoulder Instructions      Home Living Family/patient expects to be discharged to:: Private residence Living Arrangements: Spouse/significant other Available Help at Discharge: Family;Available 24 hours/day Type of Home: House Home Access: Level entry     Home Layout: One level     Bathroom Shower/Tub: Tub/shower unit;Walk-in shower   Bathroom Toilet: Standard Bathroom Accessibility:  Yes How Accessible: Accessible via walker Home Equipment: Shower seat - built in;Cane - single point;Walker - 2 wheels          Prior Functioning/Environment Level of Independence: Independent        Comments: very active. Walked daily    OT Diagnosis:     OT Problem List:     OT Treatment/Interventions:      OT Goals(Current goals can be found in the care plan section) Acute Rehab OT Goals Patient Stated Goal: to go home and resume activity OT Goal Formulation:  (eval only)  OT Frequency:     Barriers to D/C:            Co-evaluation              End of Session Equipment Utilized During Treatment: Gait  belt Nurse Communication: Mobility status  Activity Tolerance: Patient tolerated treatment well Patient left: in bed;with call bell/phone within reach   Time: 1140-1201 OT Time Calculation (min): 21 min Charges:  OT General Charges $OT Visit: 1 Procedure OT Evaluation $Initial OT Evaluation Tier I: 1 Procedure OT Treatments $Self Care/Home Management : 8-22 mins G-Codes:    Cathrine Krizan,HILLARY 27-Jan-2014, 1:40 PM   [facility], OTR/L  [phone] 01/27/14

## 2013-12-28 NOTE — Progress Notes (Signed)
CARE MANAGEMENT NOTE 12/28/2013  Patient:  Austin Taylor, Austin Taylor   Account Number:  [id number]  Date Initiated:  12/28/2013  Documentation initiated by:  Olga Coaster  Subjective/Objective Assessment:   ADMITTED FOR STROKE WORKUP     Action/Plan:   CM FOLLOWING FOR DCP   Anticipated DC Date:  12/31/2013   Anticipated DC Plan:  AWAITING FOR PT/OT EVALS FOR DISPOSITION NEEDS     DC Planning Services  CM consult         Status of service:  In process, will continue to follow Medicare Important Message given?   (If response is "NO", the following Medicare IM given date fields will be blank)  Per UR Regulation:  Reviewed for med. necessity/level of care/duration of stay  Comments:  10/15/2015Mindi Slicker RN,BSN,MHA [phone]

## 2013-12-28 NOTE — Progress Notes (Addendum)
[facility] PROGRESS NOTE  Austin Taylor PNT:[id number] DOB: 1940/05/23 DOA: 12/27/2013 PCP: Vena Austria, MD  Assessment/Plan: Acute cerebellum stroke:  now on plavix for secondary stroke prevention, fail aspirin.  Will start lasix 10-16. Continue with statins. LDL at 68. A 1 c pending.  ECHO and doppler pending.   HTN; permissive HTN;  Resume lasix on 10-16.   GERD (gastroesophageal reflux disease)  CKD (chronic kidney disease) stage 2, GFR 60-89 ml/min: Minimal, at baseline.  Code Status: full code.  Family Communication: care discussed with patient and wife who was at bedside.  Disposition Plan: remain inpatient.    Consultants:  neurology  Procedures:  ECHO; pending.   Doppler; pending.   Antibiotics: None  HPI/Subjective: Feeling better. Not unsteady.   Objective: Filed Vitals:   12/28/13 0847  BP: 149/81  Pulse: 97  Temp: 98.4 F (36.9 C)  Resp: 14   No intake or output data in the 24 hours ending 12/28/13 0946 Filed Weights   12/27/13 0826  Weight: 104.327 kg (230 lb)    Exam:   General:  Alert in no distress.   Cardiovascular: S 1, S 2 RRR  Respiratory: CTA  Abdomen: BS present, soft, nt  Musculoskeletal: no edema.   Neuro; non focal.   Data Reviewed: Basic Metabolic Panel:  Recent Labs Lab 12/27/13 0825  NA 140  K 4.2  CL 105  CO2 25  GLUCOSE 106*  BUN 13  CREATININE 0.96  CALCIUM 8.6   Liver Function Tests: No results found for this basename: AST, ALT, ALKPHOS, BILITOT, PROT, ALBUMIN,  in the last 168 hours No results found for this basename: LIPASE, AMYLASE,  in the last 168 hours No results found for this basename: AMMONIA,  in the last 168 hours CBC:  Recent Labs Lab 12/27/13 0825  WBC 6.9  HGB 14.2  HCT 41.4  MCV 89.0  PLT 204   Cardiac Enzymes:  Recent Labs Lab 12/27/13 0825  TROPONINI <0.30   BNP (last 3 results)  Recent Labs  12/27/13 0825  PROBNP 274.9*    CBG:  Recent Labs Lab 12/27/13 0841  GLUCAP 108*    No results found for this or any previous visit (from the past 240 hour(s)).   Studies: Dg Chest 2 View  12/27/2013   CLINICAL DATA:  Stroke  EXAM: CHEST  2 VIEW  COMPARISON:  09/21/2006  FINDINGS: There is no focal parenchymal opacity, pleural effusion, or pneumothorax. The heart and mediastinal contours are unremarkable.  There is a large hiatal hernia.  The osseous structures are unremarkable.  IMPRESSION: No active cardiopulmonary disease.  Large hiatal hernia.   Electronically Signed   By: Kathreen Devoid   On: 12/27/2013 21:48   Ct Head Wo Contrast  12/27/2013   CLINICAL DATA:  Unsteady gait.  Dizziness.  EXAM: CT HEAD WITHOUT CONTRAST  TECHNIQUE: Contiguous axial images were obtained from the base of the skull through the vertex without intravenous contrast.  COMPARISON:  None.  FINDINGS: No acute cortical infarct, hemorrhage, or mass lesion is present. The ventricles are of normal size. No significant extra-axial fluid collection is evident. The paranasal sinuses and mastoid air cells are clear. The osseous skull is intact.  IMPRESSION: Negative CT of the head.   Electronically Signed   By: Lawrence Santiago M.D.   On: 12/27/2013 09:00   Mr Jodene Nam Head Wo Contrast  12/27/2013   CLINICAL DATA:  Dizziness.  Unsteady gait.  Symptoms of acute onset.  EXAM: MRI  HEAD WITHOUT CONTRAST  MRA HEAD WITHOUT CONTRAST  TECHNIQUE: Multiplanar, multiecho pulse sequences of the brain and surrounding structures were obtained without intravenous contrast. Angiographic images of the head were obtained using MRA technique without contrast.  COMPARISON:  Head CT same day  FINDINGS: MRI HEAD FINDINGS  There are patchy areas of acute infarction affecting the right cerebellar hemisphere. These could possibly be of differing age. No involvement of the brainstem or left cerebellum. No old insults are evident. The cerebral hemispheres show minimal small vessel change  of the deep white matter. No cortical or large vessel territory infarction. No mass lesion, hemorrhage, hydrocephalus or extra-axial collection. No pituitary mass. No inflammatory sinus disease. No skull or skullbase lesion.  MRA HEAD FINDINGS  Both internal carotid arteries are widely patent into the brain. The anterior and middle cerebral vessels are patent without proximal stenosis, aneurysm or vascular malformation.  Both vertebral arteries are widely patent to the basilar, approximately equal in size. No basilar stenosis. Both posterior inferior cerebellar arteries show flow. Both superior cerebellar arteries show flow. Both posterior cerebral arteries are patent, with the right having a fetal origin from the anterior circulation.  IMPRESSION: Two areas of acute infarction within the right cerebellum, possibly of slightly different age. No evidence of significant swelling, hemorrhage or mass effect. No other acute infarction.  Mild chronic small-vessel ischemia within the cerebral hemispheric white matter.  No posterior circulation abnormality identified at MR angiography.   Electronically Signed   By: Nelson Chimes M.D.   On: 12/27/2013 11:32   Mr Brain Wo Contrast  12/27/2013   CLINICAL DATA:  Dizziness.  Unsteady gait.  Symptoms of acute onset.  EXAM: MRI HEAD WITHOUT CONTRAST  MRA HEAD WITHOUT CONTRAST  TECHNIQUE: Multiplanar, multiecho pulse sequences of the brain and surrounding structures were obtained without intravenous contrast. Angiographic images of the head were obtained using MRA technique without contrast.  COMPARISON:  Head CT same day  FINDINGS: MRI HEAD FINDINGS  There are patchy areas of acute infarction affecting the right cerebellar hemisphere. These could possibly be of differing age. No involvement of the brainstem or left cerebellum. No old insults are evident. The cerebral hemispheres show minimal small vessel change of the deep white matter. No cortical or large vessel territory  infarction. No mass lesion, hemorrhage, hydrocephalus or extra-axial collection. No pituitary mass. No inflammatory sinus disease. No skull or skullbase lesion.  MRA HEAD FINDINGS  Both internal carotid arteries are widely patent into the brain. The anterior and middle cerebral vessels are patent without proximal stenosis, aneurysm or vascular malformation.  Both vertebral arteries are widely patent to the basilar, approximately equal in size. No basilar stenosis. Both posterior inferior cerebellar arteries show flow. Both superior cerebellar arteries show flow. Both posterior cerebral arteries are patent, with the right having a fetal origin from the anterior circulation.  IMPRESSION: Two areas of acute infarction within the right cerebellum, possibly of slightly different age. No evidence of significant swelling, hemorrhage or mass effect. No other acute infarction.  Mild chronic small-vessel ischemia within the cerebral hemispheric white matter.  No posterior circulation abnormality identified at MR angiography.   Electronically Signed   By: Nelson Chimes M.D.   On: 12/27/2013 11:32    Scheduled Meds: . aspirin  300 mg Rectal Daily   Or  . aspirin  325 mg Oral Daily  . sodium chloride  3 mL Intravenous Q12H   Continuous Infusions:   Principal Problem:   Cerebral thrombosis with  cerebral infarction Active Problems:   Dizziness   Hyperlipidemia   GERD (gastroesophageal reflux disease)   CKD (chronic kidney disease) stage 2, GFR 60-89 ml/min   Cerebellar infarct   Obesity (BMI 30-39.9)    Time spent: 35 minutes.     Niel Hummer A  [facility] Pager [phone]. If 7PM-7AM, please contact night-coverage at [email or web address], password [facility] 12/28/2013, 9:46 AM  LOS: 1 day

## 2013-12-28 NOTE — Progress Notes (Signed)
STROKE TEAM PROGRESS NOTE   HISTORY Austin Taylor is an 73 y.o. male pmh of HLD and IDA 2/2 hiatal hernia p/w unsteadiness. The patient is accompanied by wife, son, brother and sister in law whom all help provide the history. Pt felt normal when he went to bed 2330 but had complained of some nausea. He woke up at his normal time 0600 but still had some nausea and felt unsteady on his feet. He denied any numbness/tingling, weakness, HA, blurry vision, CP, or SOB. He took all his medications that included an ASA 81 mg on 12/26/13. The patient goes for a daily 2 mile walk with his brother and started to drive and felt he didn't have full control of his feet and still "not normal" and therefore decided to return home. He said he was able to transfer out of the car without assistance and didn't fall but still felt unsteady "like I needed to hold on to someone/something for stability." His wife then saw him and noticed that his speech was slow or slightly slurred she then called her son when the decision was made to come to ED. Pt has never had any similar episodes in the past and has no personal hx of MI, TIA, or stroke. He was a former smoker ~1ppd and quit in 1973 and only occasionally drinks EtOH. Pt has extensive family hx of father CABG and CAE in his 78s and mother with CHF. During this interview pt feels he is nearly back to his baseline. last known well 12/26/13 @ 2330. Patient was not administered TPA secondary to delay in arrival. He was admitted for further evaluation and treatment.  SUBJECTIVE (INTERVAL HISTORY) His wife is at the bedside.  Overall he feels his condition is stable. Stated that the unsteadiness and dizziness have been resolved.   OBJECTIVE Temp:  [97.5 F (36.4 C)-98.8 F (37.1 C)] 98 F (36.7 C) (10/15 1022) Pulse Rate:  [55-97] 57 (10/15 1022) Cardiac Rhythm:  [-] Heart block (10/15 0900) Resp:  [13-20] 18 (10/15 1022) BP: (125-159)/(62-84) 146/83 mmHg (10/15 1022) SpO2:   [94 %-99 %] 94 % (10/15 1022)   Recent Labs Lab 12/27/13 0841  GLUCAP 108*    Recent Labs Lab 12/27/13 0825  NA 140  K 4.2  CL 105  CO2 25  GLUCOSE 106*  BUN 13  CREATININE 0.96  CALCIUM 8.6   No results found for this basename: AST, ALT, ALKPHOS, BILITOT, PROT, ALBUMIN,  in the last 168 hours  Recent Labs Lab 12/27/13 0825  WBC 6.9  HGB 14.2  HCT 41.4  MCV 89.0  PLT 204    Recent Labs Lab 12/27/13 0825  TROPONINI <0.30   No results found for this basename: LABPROT, INR,  in the last 72 hours No results found for this basename: COLORURINE, APPERANCEUR, LABSPEC, PHURINE, GLUCOSEU, HGBUR, BILIRUBINUR, KETONESUR, PROTEINUR, UROBILINOGEN, NITRITE, LEUKOCYTESUR,  in the last 72 hours     Component Value Date/Time   CHOL 128 12/28/2013 0603   TRIG 109 12/28/2013 0603   HDL 38* 12/28/2013 0603   CHOLHDL 3.4 12/28/2013 0603   VLDL 22 12/28/2013 0603   LDLCALC 68 12/28/2013 0603   Lab Results  Component Value Date   HGBA1C 6.3* 12/28/2013      Component Value Date/Time   LABOPIA NONE DETECTED 12/27/2013 1023   COCAINSCRNUR NONE DETECTED 12/27/2013 1023   LABBENZ NONE DETECTED 12/27/2013 1023   AMPHETMU NONE DETECTED 12/27/2013 [place] DETECTED 12/27/2013 1023  LABBARB NONE DETECTED 12/27/2013 1023    No results found for this basename: ETH,  in the last 168 hours  Dg Chest 2 View 12/27/2013   No active cardiopulmonary disease.  Large hiatal hernia.     Ct Head Wo Contrast 12/27/2013   Negative CT of the head.     Mr Brain Wo Contrast 12/27/2013    Two areas of acute infarction within the right cerebellum, possibly of slightly different age. No evidence of significant swelling, hemorrhage or mass effect. No other acute infarction.  Mild chronic small-vessel ischemia within the cerebral hemispheric white matter.    Mr Jodene Nam Head Wo Contrast 12/27/2013   No posterior circulation abnormality identified at MR angiography.     MRA neck  1.  Suboptimal visualization of the proximal subclavian arteries and  vertebral artery origins, but normal bilateral vertebral arteries  from the V2 segments to the vertebrobasilar junction.  2. No carotid stenosis in the neck. Tortuous cervical right ICA.  Carotid Doppler  No evidence of hemodynamically significant internal carotid artery stenosis. Vertebral artery flow is antegrade.   2D Echocardiogram  - Normal biventricular size and systolic function. Pseudonormal pattern of diastolic dysfunction with normal filling pressures. No significant valvular abnormality. EF 60-65%  PHYSICAL EXAM  Temp:  [97.5 F (36.4 C)-98.6 F (37 C)] 98.4 F (36.9 C) (10/15 2150) Pulse Rate:  [57-97] 75 (10/15 2150) Resp:  [14-20] 19 (10/15 2150) BP: (121-166)/(66-96) 121/75 mmHg (10/15 2150) SpO2:  [94 %-98 %] 96 % (10/15 2150)  General - Well nourished, well developed, in no apparent distress.  Ophthalmologic - Sharp disc margins OU.  Cardiovascular - Regular rate and rhythm with no murmur.  Mental Status -  Level of arousal and orientation to time, place, and person were intact. Language including expression, naming, repetition, comprehension, reading, and writing was assessed and found intact. Attention span and concentration were normal. Recent and remote memory were intact. Fund of Knowledge was assessed and was intact.  Cranial Nerves II - XII - II - Visual field intact OU. III, IV, VI - Extraocular movements intact. V - Facial sensation intact bilaterally. VII - Facial movement intact bilaterally. VIII - Hearing & vestibular intact bilaterally. X - Palate elevates symmetrically. XI - Chin turning & shoulder shrug intact bilaterally. XII - Tongue protrusion intact.  Motor Strength - The patient's strength was normal in all extremities and pronator drift was absent.  Bulk was normal and fasciculations were absent.   Motor Tone - Muscle tone was assessed at the neck and appendages and  was normal.  Reflexes - The patient's reflexes were normal in all extremities and he had no pathological reflexes.  Sensory - Light touch, temperature/pinprick, vibration and proprioception, and Romberg testing were assessed and were normal.    Coordination - The patient had normal movements in the hands and feet with no ataxia or dysmetria.  Tremor was absent.  Gait and Station - The patient's transfers, posture, gait, station, and turns were observed as normal.   ASSESSMENT/PLAN Austin Taylor is a 73 y.o. male with history of hyperlipidemia and GERD from [facility] presenting with unsteady gait. He did not receive IV t-PA due to delay in arrival.   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  MRI  2 small R cerebellar infarcts  MRA head unremarkable  MRA neck unremarkable   Carotid Doppler  No significant stenosis   2D Echo  unremarkable  HgbA1c 6.3  SCDs for VTE prophylaxis  Cardiac thin liquids  Bedrest, OOB with assistance  aspirin 81 mg orally every day prior to admission, now on aspirin 325 mg orally every day. Will change aspirin to plavix for secondary stroke prevention  Ongoing aggressive risk factor management  Therapy recommendations:  No therapy needs  Disposition:  Return home  Hyperlipidemia  Home meds:  zocor 40 mg daily, resumed in hospital  LDL 68, at the goal of <70  Continue statin at discharge  Other Stroke Risk Factors Advanced age Former smoker, quit in 1976 ETOH use   Obesity, Body mass index is 31.19 kg/(m^2).   Other Active Problems  LE edema, takes lasix almost daily  GERD  Hospital day # 1  [facility] BIBY, MSN, RN, ANVP-BC, ANP-BC, [facility] Pager: [phone] 12/28/2013 10:53 AM   I, the attending vascular neurologist, have personally obtained a history, examined the patient, evaluated laboratory data, individually viewed imaging studies,  and formulated the assessment and plan of care.  I have made any additions or clarifications directly to the above note and agree with the findings and plan as currently documented.   Neurology will sign off. Please call with questions. Pt will follow up with Dr. Erlinda Hong at [facility] in about 2 months. Thanks for the consult.  Rosalin Hawking, MD PhD Stroke Neurology 12/28/2013 10:44 PM   To contact Stroke Continuity provider, please refer to [email or web address] After hours, contact General Neurology

## 2013-12-28 NOTE — Evaluation (Signed)
Physical Therapy Evaluation Patient Details Name: Austin Taylor MRN: [id number] DOB: 05/29/1940 Today's Date: 12/28/2013   History of Present Illness  73 yo who presented with balance deficits. MRI + 2 small areas of acute infarct R cerebellum PMHx- non-contributory  Clinical Impression  Patient evaluated by Physical Therapy with no further acute PT needs identified. All education has been completed and the patient has no further questions.  PT is signing off. Thank you for this referral.     Follow Up Recommendations No PT follow up    Equipment Recommendations  None recommended by PT    Recommendations for Other Services       Precautions / Restrictions Precautions Precautions: None Restrictions Weight Bearing Restrictions: No      Mobility  Bed Mobility Overal bed mobility: Independent                Transfers Overall transfer level: Independent Equipment used: None                Ambulation/Gait Ambulation/Gait assistance: Independent Ambulation Distance (Feet): 250 Feet Assistive device: None Gait Pattern/deviations: WFL(Within Functional Limits) Gait velocity: able to significantly vary up and down Gait velocity interpretation: at or above normal speed for age/gender    Stairs Stairs: Yes Stairs assistance: Independent Stair Management: No rails;Alternating pattern;Forwards Number of Stairs: 10    Wheelchair Mobility    Modified Rankin (Stroke Patients Only) Modified Rankin (Stroke Patients Only) Pre-Morbid Rankin Score: No symptoms Modified Rankin: No symptoms     Balance Overall balance assessment: Independent                               Standardized Balance Assessment Standardized Balance Assessment : Dynamic Gait Index Berg Balance Test Sit to Stand: Able to stand without using hands and stabilize independently Standing Unsupported: Able to stand safely 2 minutes Sitting with Back Unsupported but Feet  Supported on Floor or Stool: Able to sit safely and securely 2 minutes Stand to Sit: Sits safely with minimal use of hands Transfers: Able to transfer safely, minor use of hands Turn 360 Degrees: Able to turn 360 degrees safely in 4 seconds or less Standing Unsupported, Alternately Place Feet on Step/Stool: Able to stand independently and safely and complete 8 steps in 20 seconds Dynamic Gait Index Level Surface: Normal Change in Gait Speed: Normal Gait with Horizontal Head Turns: Normal Gait with Vertical Head Turns: Normal Gait and Pivot Turn: Normal Step Over Obstacle: Normal Step Around Obstacles: Normal Steps: Normal       Pertinent Vitals/Pain Pain Assessment: No/denies pain    Home Living Family/patient expects to be discharged to:: Private residence Living Arrangements: Spouse/significant other Available Help at Discharge: Family;Available 24 hours/day Type of Home: House Home Access: Stairs to enter Entrance Stairs-Rails: None Entrance Stairs-Number of Steps: 2 Home Layout: One level Home Equipment: Shower seat - built in;Cane - single point;Walker - 2 wheels      Prior Function Level of Independence: Independent         Comments: very active. Walked daily     Hand Dominance   Dominant Hand: Right    Extremity/Trunk Assessment   Upper Extremity Assessment: Defer to OT evaluation           Lower Extremity Assessment: Overall WFL for tasks assessed (no coordination deficits noted with testing)      Cervical / Trunk Assessment: Normal  Communication   Communication: No difficulties  Cognition Arousal/Alertness: Awake/alert Behavior During Therapy: WFL for tasks assessed/performed Overall Cognitive Status: Within Functional Limits for tasks assessed                      General Comments      Exercises        Assessment/Plan    PT Assessment Patent does not need any further PT services  PT Diagnosis Difficulty walking   PT  Problem List    PT Treatment Interventions     PT Goals (Current goals can be found in the Care Plan section) Acute Rehab PT Goals Patient Stated Goal: to go home and resume activity PT Goal Formulation: All assessment and education complete, DC therapy    Frequency     Barriers to discharge        Co-evaluation               End of Session Equipment Utilized During Treatment: Gait belt Activity Tolerance: Patient tolerated treatment well Patient left: in chair;with call bell/phone within reach;with family/visitor present           Time: 1356-1405 PT Time Calculation (min): 9 min   Charges:   PT Evaluation $Initial PT Evaluation Tier I: 1 Procedure     PT G Codes:          Austin Taylor Dec 31, 2013, 2:12 PM Pager [phone]

## 2013-12-28 NOTE — Progress Notes (Signed)
VASCULAR LAB PRELIMINARY  PRELIMINARY  PRELIMINARY  PRELIMINARY  Carotid duplex completed.    Preliminary report:  Bilateral:  1-39% ICA stenosis.  Vertebral artery flow is antegrade.    Justis Dupas, RVS 12/28/2013, 1:05 PM

## 2013-12-29 ENCOUNTER — Other Ambulatory Visit: Payer: Self-pay

## 2013-12-29 DIAGNOSIS — I4892 Unspecified atrial flutter: Secondary | ICD-10-CM

## 2013-12-29 DIAGNOSIS — Z7901 Long term (current) use of anticoagulants: Secondary | ICD-10-CM

## 2013-12-29 DIAGNOSIS — I10 Essential (primary) hypertension: Secondary | ICD-10-CM

## 2013-12-29 DIAGNOSIS — Z5181 Encounter for therapeutic drug level monitoring: Secondary | ICD-10-CM

## 2013-12-29 DIAGNOSIS — I519 Heart disease, unspecified: Secondary | ICD-10-CM

## 2013-12-29 DIAGNOSIS — I5189 Other ill-defined heart diseases: Secondary | ICD-10-CM

## 2013-12-29 LAB — BASIC METABOLIC PANEL
Anion gap: 13 (ref 5–15)
BUN: 11 mg/dL (ref 6–23)
CO2: 23 meq/L (ref 19–32)
Calcium: 9.1 mg/dL (ref 8.4–10.5)
Chloride: 102 mEq/L (ref 96–112)
Creatinine, Ser: 0.92 mg/dL (ref 0.50–1.35)
GFR calc Af Amer: >90 mL/min (ref >90)
GFR, EST NON AFRICAN AMERICAN: 81 mL/min — AB (ref >90)
GLUCOSE: 145 mg/dL — AB (ref 70–99)
Potassium: 3.9 mEq/L (ref 3.7–5.3)
SODIUM: 138 meq/L (ref 137–147)

## 2013-12-29 LAB — TROPONIN I: Troponin I: <0.3 ng/mL (ref <0.30)

## 2013-12-29 MED ORDER — FUROSEMIDE 20 MG PO TABS
20.0000 mg | ORAL_TABLET | Freq: Every day | ORAL | Status: DC
  Administered 2013-12-29 – 2013-12-30 (×2): 20 mg via ORAL
  Filled 2013-12-29 (×2): qty 1

## 2013-12-29 MED ORDER — FLUTICASONE PROPIONATE 50 MCG/ACT NA SUSP
1.0000 | Freq: Every day | NASAL | Status: DC
  Administered 2013-12-29 – 2013-12-30 (×2): 1 via NASAL
  Filled 2013-12-29: qty 16

## 2013-12-29 MED ORDER — DILTIAZEM LOAD VIA INFUSION
10.0000 mg | Freq: Once | INTRAVENOUS | Status: DC
  Filled 2013-12-29: qty 10

## 2013-12-29 MED ORDER — ATORVASTATIN CALCIUM 20 MG PO TABS
20.0000 mg | ORAL_TABLET | Freq: Every day | ORAL | Status: DC
  Administered 2013-12-29: 20 mg via ORAL
  Filled 2013-12-29 (×2): qty 1

## 2013-12-29 MED ORDER — DILTIAZEM HCL ER 60 MG PO CP12
60.0000 mg | ORAL_CAPSULE | Freq: Two times a day (BID) | ORAL | Status: DC
  Administered 2013-12-29 – 2013-12-30 (×3): 60 mg via ORAL
  Filled 2013-12-29 (×4): qty 1

## 2013-12-29 MED ORDER — APIXABAN 5 MG PO TABS
5.0000 mg | ORAL_TABLET | Freq: Two times a day (BID) | ORAL | Status: DC
  Administered 2013-12-29 – 2013-12-30 (×3): 5 mg via ORAL
  Filled 2013-12-29 (×4): qty 1

## 2013-12-29 MED ORDER — OFF THE BEAT BOOK
Freq: Once | Status: AC
  Administered 2013-12-29: 08:00:00
  Filled 2013-12-29: qty 1

## 2013-12-29 MED ORDER — DILTIAZEM HCL 100 MG IV SOLR
5.0000 mg/h | INTRAVENOUS | Status: DC
Start: 2013-12-29 — End: 2013-12-29
  Filled 2013-12-29: qty 100

## 2013-12-29 NOTE — Consult Note (Signed)
CARDIOLOGY CONSULT NOTE   Patient ID: Austin Taylor MRN: [id number], DOB/AGE: 1940/07/29   Admit date: 12/27/2013 Date of Consult: 12/29/2013  Primary Physician: Vena Austria, MD Primary Cardiologist: None   Reason for consult:  New dg of a-flutter, stroke  Problem List  Past Medical History  Diagnosis Date  . Hyperlipidemia     History reviewed. No pertinent past surgical history.   Allergies  Allergies  Allergen Reactions  . Codeine     Nausea     HPI   Austin Taylor is an 73 y.o. male h/o hyperlipidemia who presented on 10/14 with unsteady gait and slurred speech and was diagnosed with an acute cerebellar stroke.  He took all his medications that included an ASA 81 mg on 12/26/13. The patient went for a daily 2 mile walk with his brother and started to drive and felt he didn't have full control of his feet and still "not normal" and therefore decided to return home. He has no personal hx of MI, TIA, or stroke. He was a former smoker ~1ppd and quit in 1973 and only occasionally drinks EtOH. Pt has extensive family hx of father CABG and CAE in his 41s and mother with CHF. During this interview pt feels he is nearly back to his baseline.   This morning at 3 am he developed atrial flutter with variable block and ventricular rate 80-120 BPM. He denies palpitations but feels fatigue. His wife and son states that he seemed tired and short of breath on exertion in the last month. No LE edema, no orthopnea or PND. No chest pain.    Inpatient Medications  . clopidogrel  75 mg Oral Daily  . furosemide  20 mg Oral Daily  . simvastatin  40 mg Oral q1800  . sodium chloride  3 mL Intravenous Q12H    Family History No family history on file.   Social History History   Social History  . Marital Status: Married    Spouse Name: N/A    Number of Children: N/A  . Years of Education: N/A   Occupational History  . Not on file.   Social History Main Topics    . Smoking status: Former Smoker    Quit date: 08/02/1974  . Smokeless tobacco: Not on file  . Alcohol Use: Yes     Comment: occassional  . Drug Use: No  . Sexual Activity: Not on file   Other Topics Concern  . Not on file   Social History Narrative  . No narrative on file     Review of Systems  General:  No chills, fever, night sweats or weight changes.  Cardiovascular:  No chest pain, dyspnea on exertion, edema, orthopnea, palpitations, paroxysmal nocturnal dyspnea. Dermatological: No rash, lesions/masses Respiratory: No cough, dyspnea Urologic: No hematuria, dysuria Abdominal:   No nausea, vomiting, diarrhea, bright red blood per rectum, melena, or hematemesis Neurologic:  No visual changes, wkns, changes in mental status. All other systems reviewed and are otherwise negative except as noted above.  Physical Exam  Blood pressure 144/90, pulse 82, temperature 98.6 F (37 C), temperature source Oral, resp. rate 18, height 6' (1.829 m), weight 227 lb (102.967 kg), SpO2 96.00%.  General: Pleasant, NAD Psych: Normal affect. Neuro: Alert and oriented X 3. Moves all extremities spontaneously. HEENT: Normal  Neck: Supple without bruits or JVD. Lungs:  Resp regular and unlabored, CTA. Heart: RRR no s3, s4, or murmurs. Abdomen: Soft, non-tender, non-distended, BS + x 4.  Extremities: No clubbing, cyanosis or edema. DP/PT/Radials 2+ and equal bilaterally.  Labs   Recent Labs  12/27/13 0825  TROPONINI <0.30   Lab Results  Component Value Date   WBC 6.9 12/27/2013   HGB 14.2 12/27/2013   HCT 41.4 12/27/2013   MCV 89.0 12/27/2013   PLT 204 12/27/2013    Recent Labs Lab 12/27/13 0825  NA 140  K 4.2  CL 105  CO2 25  BUN 13  CREATININE 0.96  CALCIUM 8.6  GLUCOSE 106*   Lab Results  Component Value Date   CHOL 128 12/28/2013   HDL 38* 12/28/2013   LDLCALC 68 12/28/2013   TRIG 109 12/28/2013   Lab Results  Component Value Date   DDIMER  Value: 0.42         AT THE INHOUSE ESTABLISHED CUTOFF VALUE OF 0.48 ug/mL FEU, THIS ASSAY HAS BEEN DOCUMENTED IN THE LITERATURE TO HAVE 09/21/2006   No components found with this basename: POCBNP,   Radiology/Studies  Dg Chest 2 View  12/27/2013   CLINICAL DATA:  Stroke  EXAM: CHEST  2 VIEW  COMPARISON:  09/21/2006  FINDINGS: There is no focal parenchymal opacity, pleural effusion, or pneumothorax. The heart and mediastinal contours are unremarkable.  There is a large hiatal hernia.  The osseous structures are unremarkable.  IMPRESSION: No active cardiopulmonary disease.  Large hiatal hernia.   Electronically Signed   By: Kathreen Devoid   On: 12/27/2013 21:48   Ct Head Wo Contrast  12/27/2013   CLINICAL DATA:  Unsteady gait.  Dizziness.  EXAM: CT HEAD WITHOUT CONTRAST  TECHNIQUE: Contiguous axial images were obtained from the base of the skull through the vertex without intravenous contrast.  COMPARISON:  None.  FINDINGS: No acute cortical infarct, hemorrhage, or mass lesion is present. The ventricles are of normal size. No significant extra-axial fluid collection is evident. The paranasal sinuses and mastoid air cells are clear. The osseous skull is intact.  IMPRESSION: Negative CT of the head.   Electronically Signed   By: Lawrence Santiago M.D.   On: 12/27/2013 09:00   Mr Virgel Paling Wo Contrast  12/27/2013     IMPRESSION: Two areas of acute infarction within the right cerebellum, possibly of slightly different age. No evidence of significant swelling, hemorrhage or mass effect. No other acute infarction.  Mild chronic small-vessel ischemia within the cerebral hemispheric white matter.  No posterior circulation abnormality identified at MR angiography.     Echocardiogram - 12/28/2013 - Left ventricle: The cavity size was normal. There was mild concentric hypertrophy. Systolic function was normal. The estimated ejection fraction was in the range of 60% to 65%. Wall motion was normal; there were no regional wall  motion abnormalities. Features are consistent with a pseudonormal left ventricular filling pattern, with concomitant abnormal relaxation and increased filling pressure (grade 2 diastolic dysfunction). There was no evidence of elevated ventricular filling pressure by Doppler parameters. - Aortic valve: There was no regurgitation. - Aortic root: The aortic root was normal in size. - Mitral valve: There was mild regurgitation. - Left atrium: The atrium was mildly dilated. - Right ventricle: Systolic function was normal. - Right atrium: The atrium was normal in size. - Tricuspid valve: There was mild regurgitation. - Pulmonary arteries: Systolic pressure was within the normal range. - Inferior vena cava: The vessel was normal in size. - Pericardium, extracardiac: There was no pericardial effusion.  Impressions:  - Normal biventricular size and systolic function. Pseudonormal pattern of diastolic dysfunction  with normal filling pressures. No significant valvular abnormality.  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 New diagnosis of atrial flutter - variable block, we will order 12 lead ECG to determine the type. Rate controlled after iv Cardizem. We will start Cardizem SR 60 mg PO BID (borderline BP with recent stroke). EP consult for possible ablation. He needs anticoagulation with Eliquis vs Xarelto - depending on insurance coverage.  Continuation of Plavix upon neurology.  2. New dg of diastolic dysfunction - grade 2, normal filling pressures and the patient appears euvolemic  3. Hypertension - careful for now with recent stroke, however, moderate LVH on echocardiogram. He will need tight BP control.   Signed, Dorothy Spark, MD, [facility] 12/29/2013, 11:03 AM

## 2013-12-29 NOTE — Progress Notes (Signed)
ANTICOAGULATION CONSULT NOTE - Initial Consult  Pharmacy Consult for Apixaban Indication: Atrial Flutter   Allergies  Allergen Reactions  . Codeine     Nausea     Patient Measurements: Height: 6' (182.9 cm) Weight: 227 lb (102.967 kg) IBW/kg (Calculated) : 77.6  Vital Signs: Temp: 98.6 F (37 C) (10/16 0830) Temp Source: Oral (10/16 0830) BP: 144/90 mmHg (10/16 0830) Pulse Rate: 82 (10/16 0830)  Labs:  Recent Labs  12/27/13 0825 12/29/13 0936  HGB 14.2  --   HCT 41.4  --   PLT 204  --   CREATININE 0.96 0.92  TROPONINI <0.30 <0.30    Estimated Creatinine Clearance: 87.5 ml/min (by C-G formula based on Cr of 0.92).   Medical History: Past Medical History  Diagnosis Date  . Hyperlipidemia     Medications:  Prescriptions prior to admission  Medication Sig Dispense Refill  . aspirin 81 MG tablet Take 81 mg by mouth daily.      . ferrous sulfate 325 (65 FE) MG tablet Take 325 mg by mouth daily with breakfast.      . furosemide (LASIX) 20 MG tablet Take 20-40 mg by mouth daily as needed (for sweling).      . simvastatin (ZOCOR) 40 MG tablet Take 40 mg by mouth daily.        Assessment: 21 YOM  With acute stroke developed an episode of atrial flutter. Now in sinus rhythm. Cardiology starting patient on Eliquis for secondary stroke prevention. Patient was not on any anticoagulation prior to admission. Wt >60 kg. SCr < 1.5. Age < 49. CBC wnl   Goal of Therapy:  Stroke prevention  Monitor platelets by anticoagulation protocol: Yes   Plan:  -Start Apixaban 5 mg twice daily  -Monitor renal fx and s/s of bleeding -Provide patient education -Pharmacy will sign off since patient will not need any further dosage adjustments. Will follow peripherally.   Albertina Parr, PharmD.  Clinical Pharmacist Pager [phone]

## 2013-12-29 NOTE — Care Management Note (Signed)
    Page 1 of 1   12/30/2013     3:06:08 PM CARE MANAGEMENT NOTE 12/30/2013  Patient:  Austin Taylor, Austin Taylor   Account Number:  [id number]  Date Initiated:  12/28/2013  Documentation initiated by:  Olga Coaster  Subjective/Objective Assessment:   ADMITTED FOR STROKE WORKUP     Action/Plan:   CM FOLLOWING FOR DCP   Anticipated DC Date:  12/31/2013   Anticipated DC Plan:  [place]  CM consult  Medication Assistance      Choice offered to / List presented to:             Status of service:  Completed, signed off Medicare Important Message given?  YES (If response is "NO", the following Medicare IM given date fields will be blank) Date Medicare IM given:  12/29/2013 Medicare IM given by:  GRAVES-BIGELOW,BRENDA Date Additional Medicare IM given:   Additional Medicare IM given by:    Discharge Disposition:    Per UR Regulation:  Reviewed for med. necessity/level of care/duration of stay  If discussed at [place] of Stay Meetings, dates discussed:    Comments:  12/30/13 14:00 CM met with pt in room and gave pt a pre-activated free 30 day trial card for eliquis.  Pt verbalizes understanding the 30 day trial will give him enough time to get the medication preauthorized at his PCP followup visit for refills.  No other CM needs were communicated.  Mariane Masters, BSN, [place].  10/15/2015Mindi Slicker RN,BSN,MHA [phone]

## 2013-12-29 NOTE — Progress Notes (Signed)
[facility] PROGRESS NOTE  Austin Taylor WNI:[id number] DOB: Jul 28, 1940 DOA: 12/27/2013 PCP: Vena Austria, MD  Assessment/Plan: Acute cerebellum stroke:  on plavix for secondary stroke prevention, fail aspirin.  Will start lasix 10-16. Continue with statins. LDL at 68. A 1 c 6 ECHO: Diastolic dysfunction grade 2.  Doppler; 1-39% ICA stenosis. Vertebral artery flow is antegrade.  He will probably need eliquis vs coumadin vs xarelto.   A flutter: Develop episode of A flutter. Cardiology consulted. Now on sinus rhythm.  Rate controlled now.   HTN; permissive HTN;  Resume lasix on 10-16.   GERD (gastroesophageal reflux disease)  CKD (chronic kidney disease) stage 2, GFR 60-89 ml/min: Minimal, at baseline.  Code Status: full code.  Family Communication: care discussed with patient and wife who was at bedside.  Disposition Plan: remain inpatient.    Consultants:  neurology  Procedures:  ECHO;   Doppler;  Antibiotics: None  HPI/Subjective: Feeling well, no complaints. Episode from last night notice.   Objective: Filed Vitals:   12/29/13 0830  BP: 144/90  Pulse: 82  Temp: 98.6 F (37 C)  Resp: 18    Intake/Output Summary (Last 24 hours) at 12/29/13 0947 Last data filed at 12/28/13 1815  Gross per 24 hour  Intake    240 ml  Output      0 ml  Net    240 ml   Filed Weights   12/27/13 0826 12/29/13 0400  Weight: 104.327 kg (230 lb) 102.967 kg (227 lb)    Exam:   General:  Alert in no distress.   Cardiovascular: S 1, S 2 RRR  Respiratory: CTA  Abdomen: BS present, soft, nt  Musculoskeletal: no edema.   Neuro; non focal.   Data Reviewed: Basic Metabolic Panel:  Recent Labs Lab 12/27/13 0825  NA 140  K 4.2  CL 105  CO2 25  GLUCOSE 106*  BUN 13  CREATININE 0.96  CALCIUM 8.6   Liver Function Tests: No results found for this basename: AST, ALT, ALKPHOS, BILITOT, PROT, ALBUMIN,  in the last 168 hours No results found  for this basename: LIPASE, AMYLASE,  in the last 168 hours No results found for this basename: AMMONIA,  in the last 168 hours CBC:  Recent Labs Lab 12/27/13 0825  WBC 6.9  HGB 14.2  HCT 41.4  MCV 89.0  PLT 204   Cardiac Enzymes:  Recent Labs Lab 12/27/13 0825  TROPONINI <0.30   BNP (last 3 results)  Recent Labs  12/27/13 0825  PROBNP 274.9*   CBG:  Recent Labs Lab 12/27/13 0841  GLUCAP 108*    No results found for this or any previous visit (from the past 240 hour(s)).   Studies: Dg Chest 2 View  12/27/2013   CLINICAL DATA:  Stroke  EXAM: CHEST  2 VIEW  COMPARISON:  09/21/2006  FINDINGS: There is no focal parenchymal opacity, pleural effusion, or pneumothorax. The heart and mediastinal contours are unremarkable.  There is a large hiatal hernia.  The osseous structures are unremarkable.  IMPRESSION: No active cardiopulmonary disease.  Large hiatal hernia.   Electronically Signed   By: Kathreen Devoid   On: 12/27/2013 21:48   Mr Jodene Nam Head Wo Contrast  12/27/2013   CLINICAL DATA:  Dizziness.  Unsteady gait.  Symptoms of acute onset.  EXAM: MRI HEAD WITHOUT CONTRAST  MRA HEAD WITHOUT CONTRAST  TECHNIQUE: Multiplanar, multiecho pulse sequences of the brain and surrounding structures were obtained without intravenous contrast. Angiographic images of the  head were obtained using MRA technique without contrast.  COMPARISON:  Head CT same day  FINDINGS: MRI HEAD FINDINGS  There are patchy areas of acute infarction affecting the right cerebellar hemisphere. These could possibly be of differing age. No involvement of the brainstem or left cerebellum. No old insults are evident. The cerebral hemispheres show minimal small vessel change of the deep white matter. No cortical or large vessel territory infarction. No mass lesion, hemorrhage, hydrocephalus or extra-axial collection. No pituitary mass. No inflammatory sinus disease. No skull or skullbase lesion.  MRA HEAD FINDINGS  Both  internal carotid arteries are widely patent into the brain. The anterior and middle cerebral vessels are patent without proximal stenosis, aneurysm or vascular malformation.  Both vertebral arteries are widely patent to the basilar, approximately equal in size. No basilar stenosis. Both posterior inferior cerebellar arteries show flow. Both superior cerebellar arteries show flow. Both posterior cerebral arteries are patent, with the right having a fetal origin from the anterior circulation.  IMPRESSION: Two areas of acute infarction within the right cerebellum, possibly of slightly different age. No evidence of significant swelling, hemorrhage or mass effect. No other acute infarction.  Mild chronic small-vessel ischemia within the cerebral hemispheric white matter.  No posterior circulation abnormality identified at MR angiography.   Electronically Signed   By: Nelson Chimes M.D.   On: 12/27/2013 11:32   Mr Angiogram Neck W Wo Contrast  12/28/2013   CLINICAL DATA:  73 year old male with acute right cerebellar stroke presenting as acute nausea and dizziness. Initial encounter.  EXAM: MRA NECK WITHOUT AND WITH CONTRAST  TECHNIQUE: Multiplanar and multiecho pulse sequences of the neck were obtained without and with intravenous contrast. Angiographic images of the neck were obtained using MRA technique without and with intravenous contrast.  CONTRAST:  20 mL MultiHance.  COMPARISON:  Brain and intracranial MRA 12/27/2013.  FINDINGS: Precontrast time-of-flight images. Antegrade flow in both carotid and vertebral arteries in the neck. Intermittently tortuous right vertebral artery.  Post-contrast MRA imaging. Suboptimal contrast bolus timing, and some motion artifact in the chest.  Subsequently the proximal subclavian arteries and vertebral artery origins are not well visualized. However, just beyond their origins both vertebral arteries appear normal throughout the neck and to the vertebrobasilar junction. Both PICA  are patent.  Bovine type arch configuration suspected. Both common carotid artery origins appear patent. No CCA stenosis. Carotid bifurcations are patent without stenosis. Tortuous distal cervical right ICA. No cervical ICA stenosis.  IMPRESSION: 1. Suboptimal visualization of the proximal subclavian arteries and vertebral artery origins, but normal bilateral vertebral arteries from the V2 segments to the vertebrobasilar junction. 2. No carotid stenosis in the neck.  Tortuous cervical right ICA.   Electronically Signed   By: Lars Pinks M.D.   On: 12/28/2013 11:33   Mr Brain Wo Contrast  12/27/2013   CLINICAL DATA:  Dizziness.  Unsteady gait.  Symptoms of acute onset.  EXAM: MRI HEAD WITHOUT CONTRAST  MRA HEAD WITHOUT CONTRAST  TECHNIQUE: Multiplanar, multiecho pulse sequences of the brain and surrounding structures were obtained without intravenous contrast. Angiographic images of the head were obtained using MRA technique without contrast.  COMPARISON:  Head CT same day  FINDINGS: MRI HEAD FINDINGS  There are patchy areas of acute infarction affecting the right cerebellar hemisphere. These could possibly be of differing age. No involvement of the brainstem or left cerebellum. No old insults are evident. The cerebral hemispheres show minimal small vessel change of the deep white matter. No  cortical or large vessel territory infarction. No mass lesion, hemorrhage, hydrocephalus or extra-axial collection. No pituitary mass. No inflammatory sinus disease. No skull or skullbase lesion.  MRA HEAD FINDINGS  Both internal carotid arteries are widely patent into the brain. The anterior and middle cerebral vessels are patent without proximal stenosis, aneurysm or vascular malformation.  Both vertebral arteries are widely patent to the basilar, approximately equal in size. No basilar stenosis. Both posterior inferior cerebellar arteries show flow. Both superior cerebellar arteries show flow. Both posterior cerebral  arteries are patent, with the right having a fetal origin from the anterior circulation.  IMPRESSION: Two areas of acute infarction within the right cerebellum, possibly of slightly different age. No evidence of significant swelling, hemorrhage or mass effect. No other acute infarction.  Mild chronic small-vessel ischemia within the cerebral hemispheric white matter.  No posterior circulation abnormality identified at MR angiography.   Electronically Signed   By: Nelson Chimes M.D.   On: 12/27/2013 11:32    Scheduled Meds: . clopidogrel  75 mg Oral Daily  . off the beat book   Does not apply Once  . simvastatin  40 mg Oral q1800  . sodium chloride  3 mL Intravenous Q12H   Continuous Infusions:   Principal Problem:   Cerebral thrombosis with cerebral infarction Active Problems:   Dizziness   Hyperlipidemia   GERD (gastroesophageal reflux disease)   CKD (chronic kidney disease) stage 2, GFR 60-89 ml/min   Cerebellar infarct   Obesity (BMI 30-39.9)    Time spent: 35 minutes.     Niel Hummer A  [facility] Pager [phone]. If 7PM-7AM, please contact night-coverage at [email or web address], password [facility] 12/29/2013, 9:47 AM  LOS: 2 days

## 2013-12-29 NOTE — Progress Notes (Signed)
Patient converted to normal sinus rhythm, EKG obtained, NP T.Rogue Bussing made aware and came to unit to see EKG. Orders given to hold Cardizem until cardiology consult. Will continue to monitor patient.

## 2013-12-29 NOTE — Progress Notes (Signed)
RN paged this NP secondary to pt having irreg rhythm on tele with HR ranging 80-140 intermittently. Pt asymptomatic. Ordered EKG and RN unable to get it to cross over into EPIC. Therefore, called by colleague, Kathline Magic NP, to go see pt, read EKG, and start any treatment needed. He is in the house at [facility].  [facility], NP [facility]

## 2013-12-29 NOTE — Progress Notes (Addendum)
[facility] progress note. Chief complaint. Tachycardia. History of present illness. This 73 year old male in hospital with an acute cerebellar stroke. Patient currently on aspirin and Plavix. Has been monitored with telemetry and nursing noted the patient to become tachycardic in the 120-140 range. A 12-lead EKG was obtained and this appears to be atrial flutter with a rate in the 90s at that time. Came to see the patient at bedside to further evaluate. He denies any chest pain or increased dyspnea. Physical exam. Vital signs. Temperature 98.8, pulse 92, respiration 19, blood pressure 160/88. O2 sats 95%. General appearance. Well-developed elderly male who is alert and in no distress. Cardiac. Irregular rhythm with rate in the 90s. Lungs. Breath sounds are clear. Abdomen. Soft with positive bowel sounds. Impression/plan. Problem 1. Atrial flutter with intermittent RVR. We'll administer a 10 mg dose of Cardizem can initiate patient on a Cardizem drip. My hope is that we can convert the patient back to normal sinus rhythm. Will defer decision regarding anticoagulation per rounding physician and neurology. Unfortunately I have to transfer the patient to a telemetry unit to initiate a Cardizem drip.  Addendum: With arrival to telemetry unit the patient appeared to be in sinus rhythm. An EKG was obtained and this in fact confirm normal sinus rhythm. It also shows and a first degree AV block which appears to be new. A repeat EKG not completely sure and AV block is actually represented. Patient did not receive either the Cardizem bolus or the Cardizem drip. I've discontinued these orders and will continue to monitor under telemetry. Repeat EKG in 6 hours to reevaluate. Also added troponins every 6 hours for 3 sets given the patient's abnormal rhythms tonight. Defer decisions regarding routine antiarrhythmic and anticoagulation decisions to the rounding physician and neurology. Patient continues to have  no complaints of chest pain.

## 2013-12-29 NOTE — Progress Notes (Signed)
Nutrition Brief Note  Patient identified on the Malnutrition Screening Tool (MST) Report  Wt Readings from Last 15 Encounters:  12/29/13 227 lb (102.967 kg)    Body mass index is 30.78 kg/(m^2). Patient meets criteria for obesity based on current BMI.   Current diet order is heart healthy, patient is consuming approximately 100% of meals at this time. Labs and medications reviewed.   No nutrition interventions warranted at this time. If nutrition issues arise, please consult RD.   Laurette Schimke RD, LDN

## 2013-12-29 NOTE — Progress Notes (Signed)
Paged Austin Najjar NP to notify that patient has new onset of atrial fibrillation. VSS. Patient has irregular heartbeat auscultation. Patient denies history of a. Fib or bloodclots.. Per Austin Taylor orders, EKG taken

## 2013-12-30 DIAGNOSIS — I484 Atypical atrial flutter: Secondary | ICD-10-CM

## 2013-12-30 MED ORDER — APIXABAN 5 MG PO TABS: 5.0000 mg | ORAL_TABLET | Freq: Two times a day (BID) | ORAL | Status: DC

## 2013-12-30 MED ORDER — FUROSEMIDE 20 MG PO TABS: 20.0000 mg | ORAL_TABLET | Freq: Every day | ORAL | Status: DC

## 2013-12-30 MED ORDER — FLUTICASONE PROPIONATE 50 MCG/ACT NA SUSP: 1.0000 | Freq: Every day | NASAL | Status: DC

## 2013-12-30 MED ORDER — DILTIAZEM HCL ER 60 MG PO CP12: 60.0000 mg | ORAL_CAPSULE | Freq: Two times a day (BID) | ORAL | Status: DC

## 2013-12-30 NOTE — Discharge Summary (Signed)
Physician Discharge Summary  Austin Taylor CXK:[id number] DOB: 11/17/1940 DOA: 12/27/2013  PCP: Vena Austria, MD  Admit date: 12/27/2013 Discharge date: 12/30/2013  Time spent: 35 minutes  Recommendations for Outpatient Follow-up:  1. Follow up with cardio for further care of A flutter.  2. Follow up with PCP for other chronic medical problems.   Discharge Diagnoses:    Cerebral thrombosis with cerebral infarction   A flutter.    Dizziness   Hyperlipidemia   GERD (gastroesophageal reflux disease)   CKD (chronic kidney disease) stage 2, GFR 60-89 ml/min   Cerebellar infarct   Obesity (BMI 30-39.9)   Atrial flutter   Essential hypertension   Diastolic dysfunction without heart failure   Anticoagulation management encounter   Discharge Condition: Stable.   Diet recommendation: Heart Healthy  Filed Weights   12/27/13 0826 12/29/13 0400 12/30/13 0400  Weight: 104.327 kg (230 lb) 102.967 kg (227 lb) 102.755 kg (226 lb 8.5 oz)    History of present illness:  Austin Taylor is a 73 y.o. male  With past medical history of hyperlipidemia but otherwise quite healthy was in his usual state of health when he woke up in this morning feeling quite nauseated and a little bit dizzy. while walking to the bathroom. He denied any headache, vision changes or shortness of breath. He planned to meet with his brother, but still did not feel right and felt like his legs are not under his control. He felt unsteady. His wife felt like his speech was a little bit slurred, but this resolved by the time he was in the emergency room He called his son who is an EMT who advised him to call the paramedics and patient was brought into the emergency room. In the emergency room, CT scan of the head was unremarkable, but MRI confirmed cerebellar acute infarct. Patient seen by neurology. Hospitalists were called for further evaluation and admission. On arrival to the floor, patient's unsteadiness  resolved. He does not complain of a mild headache. He has been taking all of his medicines including daily aspirin 81 mg by mouth daily.   Hospital Course:  Acute cerebellum stroke:  Started on eliquis after he converted on A flutter.   started lasix 10-16.  Continue with statins. LDL at 68.  A 1 c 6 work on diet.  ECHO: Diastolic dysfunction grade 2.  Doppler; 1-39% ICA stenosis. Vertebral artery flow is antegrade.    A flutter: Developed  episode of A flutter. Cardiology consulted. Now on sinus rhythm.  Rate controlled now.  Started on eliquis and Cardizem.   HTN; permissive HTN;  Resume lasix on 10-16.  GERD (gastroesophageal reflux disease)   CKD (chronic kidney disease) stage 2, GFR 60-89 ml/min: Minimal, at baseline.   Procedures: ECHO;  Doppler;   Consultations: Neurolgy  Discharge Exam: Filed Vitals:   12/30/13 0900  BP: 122/57  Pulse: 60  Temp: 98.9 F (37.2 C)  Resp: 18    General: Alert in no distress. Cardiovascular: S 1, S 2 RRR Respiratory: CTA  Discharge Instructions You were cared for by a hospitalist during your hospital stay. If you have any questions about your discharge medications or the care you received while you were in the hospital after you are discharged, you can call the unit and asked to speak with the hospitalist on call if the hospitalist that took care of you is not available. Once you are discharged, your primary care physician will handle any further medical issues. Please  note that NO REFILLS for any discharge medications will be authorized once you are discharged, as it is imperative that you return to your primary care physician (or establish a relationship with a primary care physician if you do not have one) for your aftercare needs so that they can reassess your need for medications and monitor your lab values.  Discharge Instructions   Diet - low sodium heart healthy    Complete by:  As directed      Increase activity slowly     Complete by:  As directed           Current Discharge Medication List    START taking these medications   Details  apixaban (ELIQUIS) 5 MG TABS tablet Take 1 tablet (5 mg total) by mouth 2 (two) times daily. Qty: 60 tablet, Refills: 0    diltiazem (CARDIZEM SR) 60 MG 12 hr capsule Take 1 capsule (60 mg total) by mouth every 12 (twelve) hours. Qty: 60 capsule, Refills: 0    fluticasone (FLONASE) 50 MCG/ACT nasal spray Place 1 spray into both nostrils daily. Qty: 1 g, Refills: 1      CONTINUE these medications which have CHANGED   Details  furosemide (LASIX) 20 MG tablet Take 1 tablet (20 mg total) by mouth daily. Qty: 30 tablet, Refills: 0      CONTINUE these medications which have NOT CHANGED   Details  ferrous sulfate 325 (65 FE) MG tablet Take 325 mg by mouth daily with breakfast.    simvastatin (ZOCOR) 40 MG tablet Take 40 mg by mouth daily.      STOP taking these medications     aspirin 81 MG tablet        Allergies  Allergen Reactions  . Codeine     Nausea    Follow-up Information   Follow up with Xu,Jindong, MD. Schedule an appointment as soon as possible for a visit in 2 months. (stroke clinic)    Specialty:  Neurology   Contact information:   [address] [place] [address] [phone]       Call Cristopher Peru, MD.   Specialty:  Cardiology   Contact information:   [address] [address] [phone]       Follow up with Vena Austria, MD In 1 week.   Specialty:  Family Medicine   Contact information:   [place]  [id number] [phone]        The results of significant diagnostics from this hospitalization (including imaging, microbiology, ancillary and laboratory) are listed below for reference.    Significant Diagnostic Studies: Dg Chest 2 View  12/27/2013   CLINICAL DATA:  Stroke  EXAM: CHEST  2 VIEW  COMPARISON:  09/21/2006  FINDINGS:  There is no focal parenchymal opacity, pleural effusion, or pneumothorax. The heart and mediastinal contours are unremarkable.  There is a large hiatal hernia.  The osseous structures are unremarkable.  IMPRESSION: No active cardiopulmonary disease.  Large hiatal hernia.   Electronically Signed   By: Kathreen Devoid   On: 12/27/2013 21:48   Ct Head Wo Contrast  12/27/2013   CLINICAL DATA:  Unsteady gait.  Dizziness.  EXAM: CT HEAD WITHOUT CONTRAST  TECHNIQUE: Contiguous axial images were obtained from the base of the skull through the vertex without intravenous contrast.  COMPARISON:  None.  FINDINGS: No acute cortical infarct, hemorrhage, or mass lesion is present. The ventricles are of normal size.  No significant extra-axial fluid collection is evident. The paranasal sinuses and mastoid air cells are clear. The osseous skull is intact.  IMPRESSION: Negative CT of the head.   Electronically Signed   By: Lawrence Santiago M.D.   On: 12/27/2013 09:00   Mr Jodene Nam Head Wo Contrast  12/27/2013   CLINICAL DATA:  Dizziness.  Unsteady gait.  Symptoms of acute onset.  EXAM: MRI HEAD WITHOUT CONTRAST  MRA HEAD WITHOUT CONTRAST  TECHNIQUE: Multiplanar, multiecho pulse sequences of the brain and surrounding structures were obtained without intravenous contrast. Angiographic images of the head were obtained using MRA technique without contrast.  COMPARISON:  Head CT same day  FINDINGS: MRI HEAD FINDINGS  There are patchy areas of acute infarction affecting the right cerebellar hemisphere. These could possibly be of differing age. No involvement of the brainstem or left cerebellum. No old insults are evident. The cerebral hemispheres show minimal small vessel change of the deep white matter. No cortical or large vessel territory infarction. No mass lesion, hemorrhage, hydrocephalus or extra-axial collection. No pituitary mass. No inflammatory sinus disease. No skull or skullbase lesion.  MRA HEAD FINDINGS  Both internal carotid  arteries are widely patent into the brain. The anterior and middle cerebral vessels are patent without proximal stenosis, aneurysm or vascular malformation.  Both vertebral arteries are widely patent to the basilar, approximately equal in size. No basilar stenosis. Both posterior inferior cerebellar arteries show flow. Both superior cerebellar arteries show flow. Both posterior cerebral arteries are patent, with the right having a fetal origin from the anterior circulation.  IMPRESSION: Two areas of acute infarction within the right cerebellum, possibly of slightly different age. No evidence of significant swelling, hemorrhage or mass effect. No other acute infarction.  Mild chronic small-vessel ischemia within the cerebral hemispheric white matter.  No posterior circulation abnormality identified at MR angiography.   Electronically Signed   By: Nelson Chimes M.D.   On: 12/27/2013 11:32   Mr Angiogram Neck W Wo Contrast  12/28/2013   CLINICAL DATA:  73 year old male with acute right cerebellar stroke presenting as acute nausea and dizziness. Initial encounter.  EXAM: MRA NECK WITHOUT AND WITH CONTRAST  TECHNIQUE: Multiplanar and multiecho pulse sequences of the neck were obtained without and with intravenous contrast. Angiographic images of the neck were obtained using MRA technique without and with intravenous contrast.  CONTRAST:  20 mL MultiHance.  COMPARISON:  Brain and intracranial MRA 12/27/2013.  FINDINGS: Precontrast time-of-flight images. Antegrade flow in both carotid and vertebral arteries in the neck. Intermittently tortuous right vertebral artery.  Post-contrast MRA imaging. Suboptimal contrast bolus timing, and some motion artifact in the chest.  Subsequently the proximal subclavian arteries and vertebral artery origins are not well visualized. However, just beyond their origins both vertebral arteries appear normal throughout the neck and to the vertebrobasilar junction. Both PICA are patent.   Bovine type arch configuration suspected. Both common carotid artery origins appear patent. No CCA stenosis. Carotid bifurcations are patent without stenosis. Tortuous distal cervical right ICA. No cervical ICA stenosis.  IMPRESSION: 1. Suboptimal visualization of the proximal subclavian arteries and vertebral artery origins, but normal bilateral vertebral arteries from the V2 segments to the vertebrobasilar junction. 2. No carotid stenosis in the neck.  Tortuous cervical right ICA.   Electronically Signed   By: Lars Pinks M.D.   On: 12/28/2013 11:33   Mr Brain Wo Contrast  12/27/2013   CLINICAL DATA:  Dizziness.  Unsteady gait.  Symptoms of acute onset.  EXAM: MRI HEAD WITHOUT CONTRAST  MRA HEAD WITHOUT CONTRAST  TECHNIQUE: Multiplanar, multiecho pulse sequences of the brain and surrounding structures were obtained without intravenous contrast. Angiographic images of the head were obtained using MRA technique without contrast.  COMPARISON:  Head CT same day  FINDINGS: MRI HEAD FINDINGS  There are patchy areas of acute infarction affecting the right cerebellar hemisphere. These could possibly be of differing age. No involvement of the brainstem or left cerebellum. No old insults are evident. The cerebral hemispheres show minimal small vessel change of the deep white matter. No cortical or large vessel territory infarction. No mass lesion, hemorrhage, hydrocephalus or extra-axial collection. No pituitary mass. No inflammatory sinus disease. No skull or skullbase lesion.  MRA HEAD FINDINGS  Both internal carotid arteries are widely patent into the brain. The anterior and middle cerebral vessels are patent without proximal stenosis, aneurysm or vascular malformation.  Both vertebral arteries are widely patent to the basilar, approximately equal in size. No basilar stenosis. Both posterior inferior cerebellar arteries show flow. Both superior cerebellar arteries show flow. Both posterior cerebral arteries are patent,  with the right having a fetal origin from the anterior circulation.  IMPRESSION: Two areas of acute infarction within the right cerebellum, possibly of slightly different age. No evidence of significant swelling, hemorrhage or mass effect. No other acute infarction.  Mild chronic small-vessel ischemia within the cerebral hemispheric white matter.  No posterior circulation abnormality identified at MR angiography.   Electronically Signed   By: Nelson Chimes M.D.   On: 12/27/2013 11:32    Microbiology: No results found for this or any previous visit (from the past 240 hour(s)).   Labs: Basic Metabolic Panel:  Recent Labs Lab 12/27/13 0825 12/29/13 0936  NA 140 138  K 4.2 3.9  CL 105 102  CO2 25 23  GLUCOSE 106* 145*  BUN 13 11  CREATININE 0.96 0.92  CALCIUM 8.6 9.1   Liver Function Tests: No results found for this basename: AST, ALT, ALKPHOS, BILITOT, PROT, ALBUMIN,  in the last 168 hours No results found for this basename: LIPASE, AMYLASE,  in the last 168 hours No results found for this basename: AMMONIA,  in the last 168 hours CBC:  Recent Labs Lab 12/27/13 0825  WBC 6.9  HGB 14.2  HCT 41.4  MCV 89.0  PLT 204   Cardiac Enzymes:  Recent Labs Lab 12/27/13 0825 12/29/13 0936 12/29/13 1612  TROPONINI <0.30 <0.30 <0.30   BNP: BNP (last 3 results)  Recent Labs  12/27/13 0825  PROBNP 274.9*   CBG:  Recent Labs Lab 12/27/13 0841  GLUCAP 108*       Signed:  Regalado, Belkys A  [facility] 12/30/2013, 1:09 PM

## 2013-12-30 NOTE — Discharge Instructions (Signed)

## 2013-12-30 NOTE — Progress Notes (Signed)
SUBJECTIVE:  No complaints  OBJECTIVE:   Vitals:   Filed Vitals:   12/29/13 1700 12/29/13 2000 12/30/13 0000 12/30/13 0400  BP: 139/75 123/66 140/77 125/81  Pulse: 72 90 84 80  Temp: 99.4 F (37.4 C) 97.9 F (36.6 C) 98 F (36.7 C) 97.7 F (36.5 C)  TempSrc: Oral Oral Oral Oral  Resp: 18 20 20 20   Height:      Weight:    226 lb 8.5 oz (102.755 kg)  SpO2: 97% 96% 97% 97%   I&O's:  No intake or output data in the 24 hours ending 12/30/13 0916 TELEMETRY: Reviewed telemetry atrial flutter with CVR     PHYSICAL EXAM General: Well developed, well nourished, in no acute distress Head: Eyes PERRLA, No xanthomas.   Normal cephalic and atramatic  Lungs:   Clear bilaterally to auscultation and percussion. Heart:   HRRR S1 S2 Pulses are 2+ & equal. Abdomen: Bowel sounds are positive, abdomen soft and non-tender without masses Extremities:   No clubbing, cyanosis or edema.  DP +1 Neuro: Alert and oriented X 3. Psych:  Good affect, responds appropriately   LABS: Basic Metabolic Panel:  Recent Labs  12/29/13 0936  NA 138  K 3.9  CL 102  CO2 23  GLUCOSE 145*  BUN 11  CREATININE 0.92  CALCIUM 9.1   Liver Function Tests: No results found for this basename: AST, ALT, ALKPHOS, BILITOT, PROT, ALBUMIN,  in the last 72 hours No results found for this basename: LIPASE, AMYLASE,  in the last 72 hours CBC: No results found for this basename: WBC, NEUTROABS, HGB, HCT, MCV, PLT,  in the last 72 hours Cardiac Enzymes:  Recent Labs  12/29/13 0936 12/29/13 1612  TROPONINI <0.30 <0.30   BNP: No components found with this basename: POCBNP,  D-Dimer: No results found for this basename: DDIMER,  in the last 72 hours Hemoglobin A1C:  Recent Labs  12/28/13 0603  HGBA1C 6.3*   Fasting Lipid Panel:  Recent Labs  12/28/13 0603  CHOL 128  HDL 38*  LDLCALC 68  TRIG 109  CHOLHDL 3.4   Thyroid Function Tests: No results found for this basename: TSH, T4TOTAL, FREET3,  T3FREE, THYROIDAB,  in the last 72 hours Anemia Panel:  Recent Labs  12/28/13 0603  VITAMINB12 579   Coag Panel:   Lab Results  Component Value Date   INR 1.0 09/21/2006    RADIOLOGY: Dg Chest 2 View  12/27/2013   CLINICAL DATA:  Stroke  EXAM: CHEST  2 VIEW  COMPARISON:  09/21/2006  FINDINGS: There is no focal parenchymal opacity, pleural effusion, or pneumothorax. The heart and mediastinal contours are unremarkable.  There is a large hiatal hernia.  The osseous structures are unremarkable.  IMPRESSION: No active cardiopulmonary disease.  Large hiatal hernia.   Electronically Signed   By: Kathreen Devoid   On: 12/27/2013 21:48   Ct Head Wo Contrast  12/27/2013   CLINICAL DATA:  Unsteady gait.  Dizziness.  EXAM: CT HEAD WITHOUT CONTRAST  TECHNIQUE: Contiguous axial images were obtained from the base of the skull through the vertex without intravenous contrast.  COMPARISON:  None.  FINDINGS: No acute cortical infarct, hemorrhage, or mass lesion is present. The ventricles are of normal size. No significant extra-axial fluid collection is evident. The paranasal sinuses and mastoid air cells are clear. The osseous skull is intact.  IMPRESSION: Negative CT of the head.   Electronically Signed   By: Bretta Bang.D.  On: 12/27/2013 09:00   Mr Jodene Nam Head Wo Contrast  12/27/2013   CLINICAL DATA:  Dizziness.  Unsteady gait.  Symptoms of acute onset.  EXAM: MRI HEAD WITHOUT CONTRAST  MRA HEAD WITHOUT CONTRAST  TECHNIQUE: Multiplanar, multiecho pulse sequences of the brain and surrounding structures were obtained without intravenous contrast. Angiographic images of the head were obtained using MRA technique without contrast.  COMPARISON:  Head CT same day  FINDINGS: MRI HEAD FINDINGS  There are patchy areas of acute infarction affecting the right cerebellar hemisphere. These could possibly be of differing age. No involvement of the brainstem or left cerebellum. No old insults are evident. The cerebral  hemispheres show minimal small vessel change of the deep white matter. No cortical or large vessel territory infarction. No mass lesion, hemorrhage, hydrocephalus or extra-axial collection. No pituitary mass. No inflammatory sinus disease. No skull or skullbase lesion.  MRA HEAD FINDINGS  Both internal carotid arteries are widely patent into the brain. The anterior and middle cerebral vessels are patent without proximal stenosis, aneurysm or vascular malformation.  Both vertebral arteries are widely patent to the basilar, approximately equal in size. No basilar stenosis. Both posterior inferior cerebellar arteries show flow. Both superior cerebellar arteries show flow. Both posterior cerebral arteries are patent, with the right having a fetal origin from the anterior circulation.  IMPRESSION: Two areas of acute infarction within the right cerebellum, possibly of slightly different age. No evidence of significant swelling, hemorrhage or mass effect. No other acute infarction.  Mild chronic small-vessel ischemia within the cerebral hemispheric white matter.  No posterior circulation abnormality identified at MR angiography.   Electronically Signed   By: Nelson Chimes M.D.   On: 12/27/2013 11:32   Mr Angiogram Neck W Wo Contrast  12/28/2013   CLINICAL DATA:  73 year old male with acute right cerebellar stroke presenting as acute nausea and dizziness. Initial encounter.  EXAM: MRA NECK WITHOUT AND WITH CONTRAST  TECHNIQUE: Multiplanar and multiecho pulse sequences of the neck were obtained without and with intravenous contrast. Angiographic images of the neck were obtained using MRA technique without and with intravenous contrast.  CONTRAST:  20 mL MultiHance.  COMPARISON:  Brain and intracranial MRA 12/27/2013.  FINDINGS: Precontrast time-of-flight images. Antegrade flow in both carotid and vertebral arteries in the neck. Intermittently tortuous right vertebral artery.  Post-contrast MRA imaging. Suboptimal contrast  bolus timing, and some motion artifact in the chest.  Subsequently the proximal subclavian arteries and vertebral artery origins are not well visualized. However, just beyond their origins both vertebral arteries appear normal throughout the neck and to the vertebrobasilar junction. Both PICA are patent.  Bovine type arch configuration suspected. Both common carotid artery origins appear patent. No CCA stenosis. Carotid bifurcations are patent without stenosis. Tortuous distal cervical right ICA. No cervical ICA stenosis.  IMPRESSION: 1. Suboptimal visualization of the proximal subclavian arteries and vertebral artery origins, but normal bilateral vertebral arteries from the V2 segments to the vertebrobasilar junction. 2. No carotid stenosis in the neck.  Tortuous cervical right ICA.   Electronically Signed   By: Lars Pinks M.D.   On: 12/28/2013 11:33   Mr Brain Wo Contrast  12/27/2013   CLINICAL DATA:  Dizziness.  Unsteady gait.  Symptoms of acute onset.  EXAM: MRI HEAD WITHOUT CONTRAST  MRA HEAD WITHOUT CONTRAST  TECHNIQUE: Multiplanar, multiecho pulse sequences of the brain and surrounding structures were obtained without intravenous contrast. Angiographic images of the head were obtained using MRA technique without contrast.  COMPARISON:  Head CT same day  FINDINGS: MRI HEAD FINDINGS  There are patchy areas of acute infarction affecting the right cerebellar hemisphere. These could possibly be of differing age. No involvement of the brainstem or left cerebellum. No old insults are evident. The cerebral hemispheres show minimal small vessel change of the deep white matter. No cortical or large vessel territory infarction. No mass lesion, hemorrhage, hydrocephalus or extra-axial collection. No pituitary mass. No inflammatory sinus disease. No skull or skullbase lesion.  MRA HEAD FINDINGS  Both internal carotid arteries are widely patent into the brain. The anterior and middle cerebral vessels are patent without  proximal stenosis, aneurysm or vascular malformation.  Both vertebral arteries are widely patent to the basilar, approximately equal in size. No basilar stenosis. Both posterior inferior cerebellar arteries show flow. Both superior cerebellar arteries show flow. Both posterior cerebral arteries are patent, with the right having a fetal origin from the anterior circulation.  IMPRESSION: Two areas of acute infarction within the right cerebellum, possibly of slightly different age. No evidence of significant swelling, hemorrhage or mass effect. No other acute infarction.  Mild chronic small-vessel ischemia within the cerebral hemispheric white matter.  No posterior circulation abnormality identified at MR angiography.   Electronically Signed   By: Nelson Chimes M.D.   On: 12/27/2013 11:32   ASSESSMENT AND PLAN  73 year old male with ischemic cerebellar stroke on 12/27/2013  1. New diagnosis of atrial flutter - variable block.  Rate controlled on Cardizem SR 60 mg PO BID (borderline BP with recent stroke).  Appears to be atypical flutter on EKG 10/16 at 0118 Continue Eliquis  Since he is rate controlled would hold off on flutter ablation in setting of recent CVA - also it looks like it is atypical flutter on EKG so may be better candidate for medical therapy.  Will anticoagulate for 4 weeks and followup with EP as outpt 2. New dg of diastolic dysfunction - grade 2, normal filling pressures and the patient appears euvolemic  3. Hypertension - careful for now with recent stroke, however, moderate LVH on echocardiogram. He will need tight BP control.   OK to d/c home from cardiac standpoint.  Please have patient followup with Dr. Cristopher Peru as outpt.  Sueanne Margarita, MD  12/30/2013  9:16 AM

## 2014-01-12 ENCOUNTER — Encounter: Payer: Self-pay | Admitting: *Deleted

## 2014-01-15 ENCOUNTER — Ambulatory Visit (INDEPENDENT_AMBULATORY_CARE_PROVIDER_SITE_OTHER): Payer: Medicare Other | Admitting: Cardiology

## 2014-01-15 ENCOUNTER — Encounter: Payer: Self-pay | Admitting: Cardiology

## 2014-01-15 DIAGNOSIS — R05 Cough: Secondary | ICD-10-CM | POA: Insufficient documentation

## 2014-01-15 DIAGNOSIS — I484 Atypical atrial flutter: Secondary | ICD-10-CM

## 2014-01-15 DIAGNOSIS — I10 Essential (primary) hypertension: Secondary | ICD-10-CM

## 2014-01-15 DIAGNOSIS — I639 Cerebral infarction, unspecified: Secondary | ICD-10-CM

## 2014-01-15 DIAGNOSIS — J019 Acute sinusitis, unspecified: Secondary | ICD-10-CM | POA: Insufficient documentation

## 2014-01-15 DIAGNOSIS — R059 Cough, unspecified: Secondary | ICD-10-CM | POA: Insufficient documentation

## 2014-01-15 DIAGNOSIS — E785 Hyperlipidemia, unspecified: Secondary | ICD-10-CM

## 2014-01-15 MED ORDER — AZITHROMYCIN 250 MG PO TABS: ORAL_TABLET | ORAL | Status: DC

## 2014-01-15 MED ORDER — DILTIAZEM HCL ER 60 MG PO CP12: 60.0000 mg | ORAL_CAPSULE | Freq: Two times a day (BID) | ORAL | Status: DC

## 2014-01-15 NOTE — Progress Notes (Signed)
Patient ID: Austin Taylor, male   DOB: [birth year], 73 y.o.   MRN: [id number]    Patient Name: Austin Taylor Date of Encounter: 01/15/2014  Primary Care Provider:  Vena Austria, MD Primary Cardiologist:  Dorothy Spark  Problem List   Past Medical History  Diagnosis Date  . Hyperlipidemia    No past surgical history on file.  Allergies  Allergies  Allergen Reactions  . Codeine     Nausea     HPI  Austin Taylor is an 73 y.o. male h/o hyperlipidemia who presented on 10/14 with unsteady gait and slurred speech and was diagnosed with an acute cerebellar stroke.  He took all his medications that included an ASA 81 mg on 12/26/13. The patient went for a daily 2 mile walk with his brother and started to drive and felt he didn't have full control of his feet and still "not normal" and therefore decided to return home. He has no personal hx of MI, TIA, or stroke. He was a former smoker ~1ppd and quit in 1973 and only occasionally drinks EtOH. Pt has extensive family hx of father CABG and CAE in his 5s and mother with CHF. During this interview pt feels he is nearly back to his baseline.   This morning at 3 am he developed atrial flutter with variable block and ventricular rate 80-120 BPM. He denies palpitations but feels fatigue. His wife and son states that he seemed tired and short of breath on exertion in the last month. No LE edema, no orthopnea or PND. No chest pain.He was started on Eliquis and PO Cardizem.  The patient is coming for first post hospitalization visit. He has recovered completely from his stroke. He is very compliant with his medicines and denies any bleeding. No melena or bright red stools.he denies any palpitations or syncope. No lower extremity edema, shortness of breath or chest pain. His only complaint is cough with whitish sputum and he feels that he might be related to postnasal drip.  Home Medications  Prior to Admission medications     Medication Sig Start Date End Date Taking? Authorizing Provider  apixaban (ELIQUIS) 5 MG TABS tablet Take 1 tablet (5 mg total) by mouth 2 (two) times daily. 12/30/13  Yes Belkys A Regalado, MD  diltiazem (CARDIZEM SR) 60 MG 12 hr capsule Take 1 capsule (60 mg total) by mouth every 12 (twelve) hours. 12/30/13  Yes Belkys A Regalado, MD  ferrous sulfate 325 (65 FE) MG tablet Take 325 mg by mouth daily with breakfast.   Yes Historical Provider, MD  furosemide (LASIX) 20 MG tablet Take 1 tablet (20 mg total) by mouth daily. 12/30/13  Yes Belkys A Regalado, MD  simvastatin (ZOCOR) 40 MG tablet Take 40 mg by mouth daily.   Yes Historical Provider, MD    Family History  Family History  Problem Relation Age of Onset  . Heart disease Mother   . Heart disease Father   . Heart disease Brother   . Other Father     carotid artey stenosis    Social History  History   Social History  . Marital Status: Married    Spouse Name: N/A    Number of Children: N/A  . Years of Education: N/A   Occupational History  . Firefighter Other    Retired   Social History Main Topics  . Smoking status: Former Smoker    Quit date: 08/02/1974  . Smokeless tobacco: Not on file  .  Alcohol Use: Yes     Comment: occassional  . Drug Use: No  . Sexual Activity: Not on file   Other Topics Concern  . Not on file   Social History Narrative     Review of Systems, as per HPI, otherwise negative General:  No chills, fever, night sweats or weight changes.  Cardiovascular:  No chest pain, dyspnea on exertion, edema, orthopnea, palpitations, paroxysmal nocturnal dyspnea. Dermatological: No rash, lesions/masses Respiratory: No cough, dyspnea Urologic: No hematuria, dysuria Abdominal:   No nausea, vomiting, diarrhea, bright red blood per rectum, melena, or hematemesis Neurologic:  No visual changes, wkns, changes in mental status. All other systems reviewed and are otherwise negative except as noted  above.  Physical Exam Blood pressure 110/70, heart rate 58 General: Pleasant, NAD Psych: Normal affect. Neuro: Alert and oriented X 3. Moves all extremities spontaneously. HEENT: Normal  Neck: Supple without bruits or JVD. Lungs:  Resp regular and unlabored, CTA. Heart: RRR no s3, s4, or murmurs. Abdomen: Soft, non-tender, non-distended, BS + x 4.  Extremities: No clubbing, cyanosis or edema. DP/PT/Radials 2+ and equal bilaterally.  Labs:  No results for input(s): CKTOTAL, CKMB, TROPONINI in the last 72 hours. Lab Results  Component Value Date   WBC 6.9 12/27/2013   HGB 14.2 12/27/2013   HCT 41.4 12/27/2013   MCV 89.0 12/27/2013   PLT 204 12/27/2013    Lab Results  Component Value Date   DDIMER  09/21/2006    0.42        AT THE INHOUSE ESTABLISHED CUTOFF VALUE OF 0.48 ug/mL FEU, THIS ASSAY HAS BEEN DOCUMENTED IN THE LITERATURE TO HAVE   Invalid input(s): POCBNP    Component Value Date/Time   NA 138 12/29/2013 0936   K 3.9 12/29/2013 0936   CL 102 12/29/2013 0936   CO2 23 12/29/2013 0936   GLUCOSE 145* 12/29/2013 0936   BUN 11 12/29/2013 0936   CREATININE 0.92 12/29/2013 0936   CALCIUM 9.1 12/29/2013 0936   PROT 6.0 09/21/2006 1139   ALBUMIN 3.1* 09/21/2006 1139   AST 29 09/21/2006 1139   ALT 37 09/21/2006 1139   ALKPHOS 62 09/21/2006 1139   BILITOT 0.6 09/21/2006 1139   GFRNONAA 81* 12/29/2013 0936   GFRAA >90 12/29/2013 0936   Lab Results  Component Value Date   CHOL 128 12/28/2013   HDL 38* 12/28/2013   LDLCALC 68 12/28/2013   TRIG 109 12/28/2013    Accessory Clinical Findings  Echocardiogram - 12/28/2013 Left ventricle: The cavity size was normal. There was mild concentric hypertrophy. Systolic function was normal. The estimated ejection fraction was in the range of 60% to 65%. Wall motion was normal; there were no regional wall motion abnormalities. Features are consistent with a pseudonormal left ventricular filling pattern, with  concomitant abnormal relaxation and increased filling pressure (grade 2 diastolic dysfunction). There was no evidence of elevated ventricular filling pressure by Doppler parameters. - Aortic valve: There was no regurgitation. - Aortic root: The aortic root was normal in size. - Mitral valve: There was mild regurgitation. - Left atrium: The atrium was mildly dilated. - Right ventricle: Systolic function was normal. - Right atrium: The atrium was normal in size. - Tricuspid valve: There was mild regurgitation. - Pulmonary arteries: Systolic pressure was within the normal range. - Inferior vena cava: The vessel was normal in size. - Pericardium, extracardiac: There was no pericardial effusion.  Impressions:  - Normal biventricular size and systolic function. Pseudonormal pattern of diastolic dysfunction with  normal filling pressures. No significant valvular abnormality.   ECG - sinus bradycardia, first-degree AV block, otherwise normal.  Doppler; 1-39% ICA stenosis. Vertebral artery flow is antegrade.     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 New diagnosis of atrial flutter - variable block.  Rate controlled on Cardizem SR 60 mg PO BID (borderline BP with recent stroke).  Appears to be atypical flutter on EKG 10/16 at 0118 Continue Eliquis, the patient is given free samples in our clinic as he need preauthorization we will call his pharmacy. Since he is rate controlled would hold off on flutter ablation in setting of recent CVA - also it looks like it is atypical flutter on EKG so may be better candidate for medical therapy.  We will schedule an appointment with Dr. Crissie Sickles.  2. New dg of diastolic dysfunction - grade 2, normal filling pressures and the patient appears euvolemic , Continue Lasix 20 mg by mouth as needed.  3. Hypertension - careful for now with recent stroke, however,  moderate LVH on echocardiogram. He will need tight BP control.   4. Acute sinusitis, postnasal drip and cough - we'll start on Z-Pak.  Follow-up with Dr. Crissie Sickles, I will be happy to follow him afterwards. Dr. Crissie Sickles was to discharge him to general cardiology clinic.   Dorothy Spark, MD, [facility] 01/15/2014, 8:18 AM

## 2014-01-15 NOTE — Patient Instructions (Signed)
You have been referred to [facility] APPT WITH DR Lovena Le  DX  A FLUTTER FOLLOW  UP  WITH DR Meda Coffee AS NEEDED Your physician has recommended you make the following change in your medication:  START  Z PAK AS  DIRECTED

## 2014-01-30 ENCOUNTER — Ambulatory Visit (INDEPENDENT_AMBULATORY_CARE_PROVIDER_SITE_OTHER): Payer: Medicare Other | Admitting: Neurology

## 2014-01-30 ENCOUNTER — Encounter: Payer: Self-pay | Admitting: Neurology

## 2014-01-30 VITALS — BP 116/74 | HR 57 | Ht 72.5 in | Wt 235.4 lb

## 2014-01-30 DIAGNOSIS — I63441 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: Secondary | ICD-10-CM

## 2014-01-30 DIAGNOSIS — I484 Atypical atrial flutter: Secondary | ICD-10-CM

## 2014-01-30 DIAGNOSIS — E785 Hyperlipidemia, unspecified: Secondary | ICD-10-CM | POA: Insufficient documentation

## 2014-01-30 NOTE — Progress Notes (Signed)
STROKE NEUROLOGY FOLLOW UP NOTE  NAME: Austin Taylor DOB: 11-May-1940  REASON FOR VISIT: stroke follow up HISTORY FROM: pt and chart  Today we had the pleasure of seeing Austin Taylor in follow-up at our Neurology Clinic. Pt was accompanied by wife.   History Summary 73 yo M with PMH of HLD and hiatal hernia was admitted on 12/27/13 due to walking difficulty and unsteadiness. He is on ASA 81mg  daily prior to admission. CT negative and MRI showed 2 small infarct at right cerebellum. MRA and CUS negative. He was continued on ASA 325mg  and considered to change to plavix. However, he was found to have A. Flutter during hospitalization and cardiology consulted. He was put on cardiazem and eliquis and was discharged after stabilization. His neuro symptoms resolved fully.   Interval History During the interval time, the patient has been doing well. No recurrent stroke symptoms. He has seen Dr. Meda Coffee as outpt for Aflutter and continued on cardiazem and eliquis. He was also referred to see Dr. Lovena Le for ablation evaluation. He takes eliquis and zocor without side effects.      REVIEW OF SYSTEMS: Full 14 system review of systems performed and notable only for those listed below and in HPI above, all others are negative:  Constitutional: N/A  Cardiovascular: N/A  Ear/Nose/Throat: N/A  Skin: N/A  Eyes: N/A  Respiratory: cough  Gastroitestinal: constipation  Genitourinary: N/A Hematology/Lymphatic: N/A  Endocrine: N/A  Musculoskeletal: N/A  Allergy/Immunology: N/A  Neurological: N/A  Psychiatric: N/A  The following represents the patient's updated allergies and side effects list: Allergies  Allergen Reactions  . Codeine     Nausea     Labs since last visit of relevance include the following: Results for orders placed or performed during the hospital encounter of 62/26/33  Basic metabolic panel  (at [facility])  Result Value Ref Range   Sodium 140 137 - 147 mEq/L   Potassium 4.2 3.7 - 5.3 mEq/L   Chloride 105 96 - 112 mEq/L   CO2 25 19 - 32 mEq/L   Glucose, Bld 106 (H) 70 - 99 mg/dL   BUN 13 6 - 23 mg/dL   Creatinine, Ser 0.96 0.50 - 1.35 mg/dL   Calcium 8.6 8.4 - 10.5 mg/dL   GFR calc non Af Amer 80 (L) >90 mL/min   GFR calc Af Amer >90 >90 mL/min   Anion gap 10 5 - 15  CBC  (at [facility])  Result Value Ref Range   WBC 6.9 4.0 - 10.5 K/uL   RBC 4.65 4.22 - 5.81 MIL/uL   Hemoglobin 14.2 13.0 - 17.0 g/dL   HCT 41.4 39.0 - 52.0 %   MCV 89.0 78.0 - 100.0 fL   MCH 30.5 26.0 - 34.0 pg   MCHC 34.3 30.0 - 36.0 g/dL   RDW 13.5 11.5 - 15.5 %   Platelets 204 150 - 400 K/uL  Drug screen panel, emergency  Result Value Ref Range   Opiates NONE DETECTED NONE DETECTED   Cocaine NONE DETECTED NONE DETECTED   Benzodiazepines NONE DETECTED NONE DETECTED   Amphetamines NONE DETECTED NONE DETECTED   Tetrahydrocannabinol NONE DETECTED NONE DETECTED   Barbiturates NONE DETECTED NONE DETECTED  Troponin I  Result Value Ref Range   Troponin I <0.30 <0.30 ng/mL  Pro b natriuretic peptide (BNP)  Result Value Ref Range   Pro B Natriuretic peptide (BNP) 274.9 (H) 0 - 125 pg/mL  Vitamin B12  Result  Value Ref Range   Vitamin B-12 579 211 - 911 pg/mL  Hemoglobin A1c  Result Value Ref Range   Hgb A1c MFr Bld 6.3 (H) <5.7 %   Mean Plasma Glucose 134 (H) <117 mg/dL  Lipid panel  Result Value Ref Range   Cholesterol 128 0 - 200 mg/dL   Triglycerides 109 <150 mg/dL   HDL 38 (L) >39 mg/dL   Total CHOL/HDL Ratio 3.4 RATIO   VLDL 22 0 - 40 mg/dL   LDL Cholesterol 68 0 - 99 mg/dL  Basic metabolic panel  Result Value Ref Range   Sodium 138 137 - 147 mEq/L   Potassium 3.9 3.7 - 5.3 mEq/L   Chloride 102 96 - 112 mEq/L   CO2 23 19 - 32 mEq/L   Glucose, Bld 145 (H) 70 - 99 mg/dL   BUN 11 6 - 23 mg/dL   Creatinine, Ser 0.92 0.50 - 1.35 mg/dL   Calcium 9.1 8.4 - 10.5 mg/dL   GFR calc non Af Amer 81 (L) >90 mL/min   GFR calc Af Amer >90 >90 mL/min    Anion gap 13 5 - 15  Troponin I (q 6hr x 3)  Result Value Ref Range   Troponin I <0.30 <0.30 ng/mL  Troponin I (q 6hr x 3)  Result Value Ref Range   Troponin I <0.30 <0.30 ng/mL  CBG, ED  Result Value Ref Range   Glucose-Capillary 108 (H) 70 - 99 mg/dL    The neurologically relevant items on the patient's problem list were reviewed on today's visit.  Neurologic Examination  A problem focused neurological exam (12 or more points of the single system neurologic examination, vital signs counts as 1 point, cranial nerves count for 8 points) was performed.  Blood pressure 116/74, pulse 57, height 6' 0.5" (1.842 m), weight 235 lb 6.4 oz (106.777 kg).  General - Well nourished, well developed, in no apparent distress.  Ophthalmologic - not able to see through.  Cardiovascular - Regular rate and rhythm with no murmur.  Mental Status -  Level of arousal and orientation to time, place, and person were intact. Language including expression, naming, repetition, comprehension was assessed and found intact. Attention span and concentration were normal. Fund of Knowledge was assessed and was intact, know 4/5 presidents.  Cranial Nerves II - XII - II - Visual field intact OU. III, IV, VI - Extraocular movements intact. V - Facial sensation intact bilaterally. VII - Facial movement intact bilaterally. VIII - Hearing & vestibular intact bilaterally. X - Palate elevates symmetrically. XI - Chin turning & shoulder shrug intact bilaterally. XII - Tongue protrusion intact.  Motor Strength - The patient's strength was normal in all extremities and pronator drift was absent.  Bulk was normal and fasciculations were absent.   Motor Tone - Muscle tone was assessed at the neck and appendages and was normal.  Reflexes - The patient's reflexes were normal in all extremities and he had no pathological reflexes.  Sensory - Light touch, temperature/pinprick, vibration and proprioception, and Romberg  testing were assessed and were normal.    Coordination - The patient had normal movements in the hands and feet with no ataxia or dysmetria.  Tremor was absent.  Gait and Station - The patient's transfers, posture, gait, station, and turns were observed as normal.  Data reviewed: I personally reviewed the images and agree with the radiology interpretations.  Dg Chest 2 View 12/27/2013 No active cardiopulmonary disease. Large hiatal hernia.   Ct  Head Wo Contrast 12/27/2013 Negative CT of the head.   Mr Brain Wo Contrast 12/27/2013 Two areas of acute infarction within the right cerebellum, possibly of slightly different age. No evidence of significant swelling, hemorrhage or mass effect. No other acute infarction. Mild chronic small-vessel ischemia within the cerebral hemispheric white matter.   Mra Head Wo Contrast 12/27/2013 No posterior circulation abnormality identified at MR angiography.   MRA neck  1. Suboptimal visualization of the proximal subclavian arteries and  vertebral artery origins, but normal bilateral vertebral arteries  from the V2 segments to the vertebrobasilar junction.  2. No carotid stenosis in the neck. Tortuous cervical right ICA.  Carotid Doppler No evidence of hemodynamically significant internal carotid artery stenosis. Vertebral artery flow is antegrade.   2D Echocardiogram - Normal biventricular size and systolic function. Pseudonormal pattern of diastolic dysfunction with normal filling pressures. No significant valvular abnormality. EF 60-65%  Component     Latest Ref Rng 12/28/2013  Cholesterol     0 - 200 mg/dL 128  Triglycerides     <150 mg/dL 109  HDL     >39 mg/dL 38 (L)  Total CHOL/HDL Ratio      3.4  VLDL     0 - 40 mg/dL 22  LDL (calc)     0 - 99 mg/dL 68  Hgb A1c MFr Bld     <5.7 % 6.3 (H)  Mean Plasma Glucose     <117 mg/dL 134 (H)  Vitamin B-12     211 - 911 pg/mL 579    Assessment: As you may  recall, he is a 73 y.o. Caucasian male with PMH of HLD and hiatal hernia was admitted 12/27/13 for two small right cerebellar stroke. Initially thought small vessel disease but he was found to have a flutter during admission, put on eliquis. He is on eliquis and simvastatin for stroke prevention now and neuro fully recovered.  Plan:  - continue eliquis and zocor for stroke prevention - check BP at home avoid hypertension to increase bleeding risk - Follow up with your primary care physician for stroke risk factor modification. Recommend maintain blood pressure goal <130/80, diabetes with hemoglobin A1c goal below 6.5% and lipids with LDL cholesterol goal below 70 mg/dL.  - follow up with cardiologists as scheduled - repeat CUS next visit - RTC in 6 months.  No orders of the defined types were placed in this encounter.    Meds ordered this encounter  Medications  . DISCONTD: diltiazem (CARDIZEM) 60 MG tablet    Sig:     Patient Instructions  - continue eliquis and zocor for stroke prevention - check BP at home - Follow up with your primary care physician for stroke risk factor modification. Recommend maintain blood pressure goal <130/80, diabetes with hemoglobin A1c goal below 6.5% and lipids with LDL cholesterol goal below 70 mg/dL.  - follow up with cardiologist as scheduled - repeat CUS next visit - follow up in 6 months.   Rosalin Hawking, MD PhD [facility] [address], [place], [place] [address] [phone]

## 2014-01-30 NOTE — Patient Instructions (Addendum)
-   continue eliquis and zocor for stroke prevention - check BP at home - Follow up with your primary care physician for stroke risk factor modification. Recommend maintain blood pressure goal <130/80, diabetes with hemoglobin A1c goal below 6.5% and lipids with LDL cholesterol goal below 70 mg/dL.  - follow up with cardiologist as scheduled - repeat CUS next visit - follow up in 6 months.

## 2014-02-12 ENCOUNTER — Encounter: Payer: Self-pay | Admitting: Internal Medicine

## 2014-02-12 ENCOUNTER — Ambulatory Visit (INDEPENDENT_AMBULATORY_CARE_PROVIDER_SITE_OTHER): Payer: Medicare Other | Admitting: Internal Medicine

## 2014-02-12 VITALS — BP 110/84 | HR 65 | Ht 72.0 in | Wt 233.0 lb

## 2014-02-12 DIAGNOSIS — I10 Essential (primary) hypertension: Secondary | ICD-10-CM

## 2014-02-12 DIAGNOSIS — I639 Cerebral infarction, unspecified: Secondary | ICD-10-CM

## 2014-02-12 DIAGNOSIS — I484 Atypical atrial flutter: Secondary | ICD-10-CM

## 2014-02-12 DIAGNOSIS — I480 Paroxysmal atrial fibrillation: Secondary | ICD-10-CM

## 2014-02-12 MED ORDER — APIXABAN 5 MG PO TABS: 5.0000 mg | ORAL_TABLET | Freq: Two times a day (BID) | ORAL | Status: DC

## 2014-02-12 NOTE — Assessment & Plan Note (Signed)
His blood pressure is very well controlled. He will continue his current medical therapy. He is encouraged to maintain a low-sodium diet.

## 2014-02-12 NOTE — Assessment & Plan Note (Signed)
The patient has almost had a complete recovery from his stroke. Hopefully, his systemic anticoagulation will prevent a recurrent stroke. His stroke risk is quite high.

## 2014-02-12 NOTE — Progress Notes (Signed)
HPI Austin Taylor is referred today by Dr. Meda Taylor for evaluation of atrial flutter. He is a very pleasant 73 year old man who sustained a stroke several weeks ago, with little if any residual deficit. While he was in the hospital, he was found to have atrial flutter, which resolved spontaneously. Review of his electrocardiogram demonstrates a left atrial flutter, and probable atrial fibrillation. My experience has been that patients who have left atrial flutter aced on the ECG morphology almost always have atrial fibrillation as well. He is fairly asymptomatic. He does not have palpitations. He denies syncope. His wife notes that at times prior to his hospitalization for stroke, he would have episodes where he felt short of breath but no palpitations. He denies peripheral edema. Allergies  Allergen Reactions  . Codeine     Nausea      Current Outpatient Prescriptions  Medication Sig Dispense Refill  . apixaban (ELIQUIS) 5 MG TABS tablet Take 1 tablet (5 mg total) by mouth 2 (two) times daily. 60 tablet 0  . diltiazem (CARDIZEM SR) 60 MG 12 hr capsule Take 1 capsule (60 mg total) by mouth every 12 (twelve) hours. 60 capsule 6  . ferrous sulfate 325 (65 FE) MG tablet Take 325 mg by mouth as directed. 3 TIMES A WEEK    . furosemide (LASIX) 20 MG tablet Take 1 tablet (20 mg total) by mouth daily. 30 tablet 0  . simvastatin (ZOCOR) 40 MG tablet Take 40 mg by mouth daily.     No current facility-administered medications for this visit.     Past Medical History  Diagnosis Date  . Hyperlipidemia     ROS:   All systems reviewed and negative except as noted in the HPI.   Past Surgical History  Procedure Laterality Date  . Rotator cuff repair Left 1998     Family History  Problem Relation Age of Onset  . Heart disease Mother   . Heart disease Father   . Heart disease Brother   . Other Father     carotid artey stenosis     History   Social History  . Marital Status:  Married    Spouse Name: N/A    Number of Children: 2  . 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 Smoking status: Former Smoker    Quit date: 08/02/1974  . Smokeless tobacco: Never Used  . Alcohol Use: Yes     Comment: occassional  . Drug Use: No  . Sexual Activity: Not on file   Other Topics Concern  . Not on file   Social History Narrative   Patient is married with 2 children.   Patient is right handed.   Patient has 14 yrs of education.   Patient does not drink caffeine.     BP 110/84 mmHg  Pulse 65  Ht 6' (1.829 m)  Wt 233 lb (105.688 kg)  BMI 31.59 kg/m2  Physical Exam:  Well appearing 73 year old man, NAD HEENT: Unremarkable Neck:  No JVD, no thyromegally Lymphatics:  No adenopathy Back:  No CVA tenderness Lungs:  Clear with no wheezes, rales, or rhonchi. HEART:  Regular rate rhythm, no murmurs, no rubs, no clicks Abd:  soft, positive bowel sounds, no organomegally, no rebound, no guarding Ext:  2 plus pulses, no edema, no cyanosis, no clubbing Skin:  No rashes no nodules Neuro:  CN II through XII  intact, motor grossly intact  EKG - normal sinus rhythm   Assess/Plan:

## 2014-02-12 NOTE — Assessment & Plan Note (Signed)
Review of the patient's electrocardiogram when he was in the hospital demonstrates left atrial flutter, based on positive flutter waves in lead V1 and negative flutter waves in lead aVL. At times on the ECG, his flutter appears to transition into atrial fibrillation. He is currently asymptomatic. I would recommend watchful waiting. He would not be a candidate for catheter ablation at this point.

## 2014-02-12 NOTE — Assessment & Plan Note (Signed)
Review of the patient's electrocardiogram demonstrates probable atrial fibrillation as well as left atrial flutter. My experience has been that patients who have left atrial flutter, will always have atrial fibrillation. He will continue systemic anticoagulation.

## 2014-02-12 NOTE — Patient Instructions (Signed)
Your physician recommends that you schedule a follow-up appointment as needed with Dr Lovena Le and as scheduled with Dr. Meda Coffee

## 2014-02-15 ENCOUNTER — Telehealth: Payer: Self-pay | Admitting: *Deleted

## 2014-02-15 NOTE — Telephone Encounter (Signed)
[employer] Patient  PA approved for [employer] number [id number] per Sharyn Lull at [place] to Pharmacist Legrand Como at [employer]

## 2014-02-15 NOTE — Telephone Encounter (Signed)
Need prior auth for eliquis

## 2014-03-28 ENCOUNTER — Encounter: Payer: Self-pay | Admitting: Cardiology

## 2014-03-28 ENCOUNTER — Ambulatory Visit (INDEPENDENT_AMBULATORY_CARE_PROVIDER_SITE_OTHER): Payer: Medicare Other | Admitting: Cardiology

## 2014-03-28 VITALS — BP 110/60 | HR 63 | Ht 72.0 in | Wt 233.0 lb

## 2014-03-28 DIAGNOSIS — I480 Paroxysmal atrial fibrillation: Secondary | ICD-10-CM

## 2014-03-28 DIAGNOSIS — I484 Atypical atrial flutter: Secondary | ICD-10-CM

## 2014-03-28 DIAGNOSIS — I10 Essential (primary) hypertension: Secondary | ICD-10-CM

## 2014-03-28 DIAGNOSIS — I5032 Chronic diastolic (congestive) heart failure: Secondary | ICD-10-CM

## 2014-03-28 LAB — COMPREHENSIVE METABOLIC PANEL
ALT: 17 U/L (ref 0–53)
AST: 18 U/L (ref 0–37)
Albumin: 4 g/dL (ref 3.5–5.2)
Alkaline Phosphatase: 75 U/L (ref 39–117)
BUN: 16 mg/dL (ref 6–23)
CO2: 28 mEq/L (ref 19–32)
Calcium: 9.3 mg/dL (ref 8.4–10.5)
Chloride: 106 mEq/L (ref 96–112)
Creatinine, Ser: 1 mg/dL (ref 0.40–1.50)
GFR: 77.4 mL/min (ref >60.00)
Glucose, Bld: 97 mg/dL (ref 70–99)
Potassium: 4.2 mEq/L (ref 3.5–5.1)
Sodium: 139 mEq/L (ref 135–145)
Total Bilirubin: 0.5 mg/dL (ref 0.2–1.2)
Total Protein: 7.4 g/dL (ref 6.0–8.3)

## 2014-03-28 MED ORDER — APIXABAN 5 MG PO TABS: 5.0000 mg | ORAL_TABLET | Freq: Two times a day (BID) | ORAL | Status: DC

## 2014-03-28 MED ORDER — FUROSEMIDE 20 MG PO TABS: 20.0000 mg | ORAL_TABLET | Freq: Every day | ORAL | Status: DC

## 2014-03-28 MED ORDER — SIMVASTATIN 40 MG PO TABS: 40.0000 mg | ORAL_TABLET | Freq: Every day | ORAL | Status: DC

## 2014-03-28 MED ORDER — DILTIAZEM HCL ER 60 MG PO CP12: 60.0000 mg | ORAL_CAPSULE | Freq: Two times a day (BID) | ORAL | Status: DC

## 2014-03-28 NOTE — Progress Notes (Signed)
Patient ID: KALIL WOESSNER, male   DOB: Jun 26, 1940, 74 y.o.   MRN: [id number] Patient ID: MAYFIELD SCHOENE, male   DOB: 06-27-1940, 74 y.o.   MRN: [id number]    Patient Name: LOUKAS ANTONSON Date of Encounter: 03/28/2014  Primary Care Provider:  Vena Austria, MD Primary Cardiologist:  Dorothy Spark  Problem List   Past Medical History  Diagnosis Date  . Hyperlipidemia    Past Surgical History  Procedure Laterality Date  . Rotator cuff repair Left 1998    Allergies  Allergies  Allergen Reactions  . Codeine     Nausea     HPI  Austin Taylor is an 74 y.o. male h/o hyperlipidemia who presented on 10/14 with unsteady gait and slurred speech and was diagnosed with an acute cerebellar stroke.  He took all his medications that included an ASA 81 mg on 12/26/13. The patient went for a daily 2 mile walk with his brother and started to drive and felt he didn't have full control of his feet and still "not normal" and therefore decided to return home. He has no personal hx of MI, TIA, or stroke. He was a former smoker ~1ppd and quit in 1973 and only occasionally drinks EtOH. Pt has extensive family hx of father CABG and CAE in his 51s and mother with CHF. During this interview pt feels he is nearly back to his baseline.   This morning at 3 am he developed atrial flutter with variable block and ventricular rate 80-120 BPM. He denies palpitations but feels fatigue. His wife and son states that he seemed tired and short of breath on exertion in the last month. No LE edema, no orthopnea or PND. No chest pain.He was started on Eliquis and PO Cardizem.  The patient is coming for first post hospitalization visit. He has recovered completely from his stroke. He is very compliant with his medicines and denies any bleeding. No melena or bright red stools.he denies any palpitations or syncope. No lower extremity edema, shortness of breath or chest pain. His only complaint is cough with  whitish sputum and he feels that he might be related to postnasal drip.  03/28/2014 - the patient is coming after 2 months. He is very compliant with his meds. He saw Dr. Lovena Le in his clinic for possible ablation of atrial flutter. Dr. Lovena Le describes that since his father is atypical and switches between flutter and fibrillation he is not a candidate for ablation at this point. He has no bleeding completion patient was. Needed denies any blood or black tarry stools. He feels somewhat more sleepy.  Home Medications  Prior to Admission medications   Medication Sig Start Date End Date Taking? Authorizing Provider  apixaban (ELIQUIS) 5 MG TABS tablet Take 1 tablet (5 mg total) by mouth 2 (two) times daily. 12/30/13  Yes Belkys A Regalado, MD  diltiazem (CARDIZEM SR) 60 MG 12 hr capsule Take 1 capsule (60 mg total) by mouth every 12 (twelve) hours. 12/30/13  Yes Belkys A Regalado, MD  ferrous sulfate 325 (65 FE) MG tablet Take 325 mg by mouth daily with breakfast.   Yes Historical Provider, MD  furosemide (LASIX) 20 MG tablet Take 1 tablet (20 mg total) by mouth daily. 12/30/13  Yes Belkys A Regalado, MD  simvastatin (ZOCOR) 40 MG tablet Take 40 mg by mouth daily.   Yes Historical Provider, MD    Family History  Family History  Problem Relation Age of Onset  .  Heart disease Mother   . Heart disease Father   . Heart disease Brother   . Other Father     carotid artey stenosis    Social History  History   Social History  . Marital Status: Married    Spouse Name: N/A    Number of Children: 2  . 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 Smoking status: Former Smoker    Quit date: 08/02/1974  . Smokeless tobacco: Never Used  . Alcohol Use: Yes     Comment: occassional  . Drug Use: No  . Sexual Activity: Not on file   Other Topics Concern  . Not on file   Social History Narrative   Patient is married with 2  children.   Patient is right handed.   Patient has 14 yrs of education.   Patient does not drink caffeine.     Review of Systems, as per HPI, otherwise negative General:  No chills, fever, night sweats or weight changes.  Cardiovascular:  No chest pain, dyspnea on exertion, edema, orthopnea, palpitations, paroxysmal nocturnal dyspnea. Dermatological: No rash, lesions/masses Respiratory: No cough, dyspnea Urologic: No hematuria, dysuria Abdominal:   No nausea, vomiting, diarrhea, bright red blood per rectum, melena, or hematemesis Neurologic:  No visual changes, wkns, changes in mental status. All other systems reviewed and are otherwise negative except as noted above.  Physical Exam Blood pressure 110/60, heart rate 63 General: Pleasant, NAD Psych: Normal affect. Neuro: Alert and oriented X 3. Moves all extremities spontaneously. HEENT: Normal  Neck: Supple without bruits or JVD. Lungs:  Resp regular and unlabored, CTA. Heart: RRR no s3, s4, or murmurs. Abdomen: Soft, non-tender, non-distended, BS + x 4.  Extremities: No clubbing, cyanosis or edema. DP/PT/Radials 2+ and equal bilaterally.  Labs:  No results for input(s): CKTOTAL, CKMB, TROPONINI in the last 72 hours. Lab Results  Component Value Date   WBC 6.9 12/27/2013   HGB 14.2 12/27/2013   HCT 41.4 12/27/2013   MCV 89.0 12/27/2013   PLT 204 12/27/2013    Lab Results  Component Value Date   DDIMER  09/21/2006    0.42        AT THE INHOUSE ESTABLISHED CUTOFF VALUE OF 0.48 ug/mL FEU, THIS ASSAY HAS BEEN DOCUMENTED IN THE LITERATURE TO HAVE   Invalid input(s): POCBNP    Component Value Date/Time   NA 138 12/29/2013 0936   K 3.9 12/29/2013 0936   CL 102 12/29/2013 0936   CO2 23 12/29/2013 0936   GLUCOSE 145* 12/29/2013 0936   BUN 11 12/29/2013 0936   CREATININE 0.92 12/29/2013 0936   CALCIUM 9.1 12/29/2013 0936   PROT 6.0 09/21/2006 1139   ALBUMIN 3.1* 09/21/2006 1139   AST 29 09/21/2006 1139   ALT 37  09/21/2006 1139   ALKPHOS 62 09/21/2006 1139   BILITOT 0.6 09/21/2006 1139   GFRNONAA 81* 12/29/2013 0936   GFRAA >90 12/29/2013 0936   Lab Results  Component Value Date   CHOL 128 12/28/2013   HDL 38* 12/28/2013   LDLCALC 68 12/28/2013   TRIG 109 12/28/2013    Accessory Clinical Findings  Echocardiogram - 12/28/2013 Left ventricle: The cavity size was normal. There was mild concentric hypertrophy. Systolic function was normal. The estimated ejection fraction was in the range of 60% to 65%. Wall motion was normal; there were no regional wall motion abnormalities. Features are consistent with  a pseudonormal left ventricular filling pattern, with concomitant abnormal relaxation and increased filling pressure (grade 2 diastolic dysfunction). There was no evidence of elevated ventricular filling pressure by Doppler parameters. - Aortic valve: There was no regurgitation. - Aortic root: The aortic root was normal in size. - Mitral valve: There was mild regurgitation. - Left atrium: The atrium was mildly dilated. - Right ventricle: Systolic function was normal. - Right atrium: The atrium was normal in size. - Tricuspid valve: There was mild regurgitation. - Pulmonary arteries: Systolic pressure was within the normal range. - Inferior vena cava: The vessel was normal in size. - Pericardium, extracardiac: There was no pericardial effusion.  Impressions:  - Normal biventricular size and systolic function. Pseudonormal pattern of diastolic dysfunction with normal filling pressures. No significant valvular abnormality.   ECG - sinus bradycardia, first-degree AV block, otherwise normal.  Doppler; 1-39% ICA stenosis. Vertebral artery flow is antegrade.     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 New diagnosis of atrial flutter - variable block.  Rate controlled on Cardizem SR 60 mg  PO BID (borderline BP with recent stroke).  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 Lovena Le for apical consultation for possible ablation that didn't find him an appropriate candidate for ablation at this time as he goes between flutter and fibrillation. Continue Eliquis.  2. New dg of diastolic dysfunction - grade 2, normal filling pressures and the patient appears euvolemic , Continue Lasix 20 mg by mouth daily. Check CMP today..  3. Hypertension - careful for now with recent stroke, however, moderate LVH on echocardiogram. He will need tight BP control.   Follow up in 1 year.   Dorothy Spark, MD, [facility] 03/28/2014, 8:54 AM

## 2014-03-28 NOTE — Patient Instructions (Addendum)
Your physician recommends that you continue on your current medications as directed. Please refer to the Current Medication list given to you today.   WE REFILLED ALL YOUR CARDIAC MEDICATIONS   Your physician recommends that you return for lab work in: [place] ([place])    Your physician wants you to follow-up in: [place] will receive a reminder letter in the mail two months in advance. If you don't receive a letter, please call our office to schedule the follow-up appointment.

## 2014-07-02 ENCOUNTER — Telehealth: Payer: Self-pay | Admitting: *Deleted

## 2014-07-02 NOTE — Telephone Encounter (Signed)
Was able to speak with his wife and get his follow up appt rescheduled. She thanked me

## 2014-07-25 ENCOUNTER — Other Ambulatory Visit: Payer: Self-pay | Admitting: Cardiology

## 2014-07-30 ENCOUNTER — Other Ambulatory Visit: Payer: Self-pay | Admitting: Cardiology

## 2014-07-31 ENCOUNTER — Ambulatory Visit: Payer: Medicare Other | Admitting: Neurology

## 2014-09-26 ENCOUNTER — Encounter: Payer: Self-pay | Admitting: Neurology

## 2014-09-26 ENCOUNTER — Ambulatory Visit (INDEPENDENT_AMBULATORY_CARE_PROVIDER_SITE_OTHER): Payer: Medicare Other | Admitting: Neurology

## 2014-09-26 VITALS — BP 120/77 | HR 63 | Ht 72.0 in | Wt 240.6 lb

## 2014-09-26 DIAGNOSIS — I480 Paroxysmal atrial fibrillation: Secondary | ICD-10-CM

## 2014-09-26 DIAGNOSIS — E785 Hyperlipidemia, unspecified: Secondary | ICD-10-CM

## 2014-09-26 DIAGNOSIS — I63441 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: Secondary | ICD-10-CM

## 2014-09-26 DIAGNOSIS — I484 Atypical atrial flutter: Secondary | ICD-10-CM

## 2014-09-26 NOTE — Progress Notes (Signed)
STROKE NEUROLOGY FOLLOW UP NOTE  NAME: Austin Taylor DOB: 1940-07-28  REASON FOR VISIT: stroke follow up HISTORY FROM: pt and chart  Today we had the pleasure of seeing Austin Taylor in follow-up at our Neurology Clinic. Pt was accompanied by wife.   History Summary 74 yo M with PMH of HLD and hiatal hernia was admitted on 12/27/13 due to walking difficulty and unsteadiness. He is on ASA 74m daily prior to admission. CT negative and MRI showed 2 small infarct at right cerebellum. MRA and CUS negative. He was continued on ASA 3274mand considered to change to plavix. However, he was found to have A. Flutter during hospitalization and cardiology consulted. He was put on cardiazem and eliquis and was discharged after stabilization. His neuro symptoms resolved fully.   01/30/14 follow up - the patient has been doing well. No recurrent stroke symptoms. He has seen Dr. NeMeda Coffees outpt for Aflutter and continued on cardiazem and eliquis. He was also referred to see Dr. TaLovena Leor ablation evaluation. He takes eliquis and zocor without side effects.  Interval History During the interval time, pt has been doing well. Continued on eliquis. Followed up with cardiology for afib and aflutter and not candidate for ablation. No recurrent stroke and BP today 120/77. Will see PCP tomorrow.  REVIEW OF SYSTEMS: Full 14 system review of systems performed and notable only for those listed below and in HPI above, all others are negative:  Constitutional: N/A  Cardiovascular: N/A  Ear/Nose/Throat: N/A  Skin: N/A  Eyes: N/A  Respiratory:  Gastroitestinal:  Genitourinary: N/A Hematology/Lymphatic: N/A  Endocrine: N/A  Musculoskeletal: aching muscles, muscle cramps  Allergy/Immunology: N/A  Neurological: N/A  Psychiatric: N/A  The following represents the patient's updated allergies and side effects list: Allergies  Allergen Reactions  . Codeine     Nausea     Labs since last visit of  relevance include the following: Results for orders placed or performed in visit on 03/28/14  Comp Met (CMET)  Result Value Ref Range   Sodium 139 135 - 145 mEq/L   Potassium 4.2 3.5 - 5.1 mEq/L   Chloride 106 96 - 112 mEq/L   CO2 28 19 - 32 mEq/L   Glucose, Bld 97 70 - 99 mg/dL   BUN 16 6 - 23 mg/dL   Creatinine, Ser 1.00 0.40 - 1.50 mg/dL   Total Bilirubin 0.5 0.2 - 1.2 mg/dL   Alkaline Phosphatase 75 39 - 117 U/L   AST 18 0 - 37 U/L   ALT 17 0 - 53 U/L   Total Protein 7.4 6.0 - 8.3 g/dL   Albumin 4.0 3.5 - 5.2 g/dL   Calcium 9.3 8.4 - 10.5 mg/dL   GFR 77.40 >60.00 mL/min    The neurologically relevant items on the patient's problem list were reviewed on today's visit.  Neurologic Examination  A problem focused neurological exam (12 or more points of the single system neurologic examination, vital signs counts as 1 point, cranial nerves count for 8 points) was performed.  Blood pressure 120/77, pulse 63, height 6' (1.829 m), weight 240 lb 9.6 oz (109.135 kg).  General - Well nourished, well developed, in no apparent distress.  Ophthalmologic - not able to see through.  Cardiovascular - Regular rate and rhythm with no murmur.  Mental Status -  Level of arousal and orientation to time, place, and person were intact. Language including expression, naming, repetition, comprehension was assessed and found intact. Attention span  and concentration were normal. Fund of Knowledge was assessed and was intact, know 4/5 presidents.  Cranial Nerves II - XII - II - Visual field intact OU. III, IV, VI - Extraocular movements intact. V - Facial sensation intact bilaterally. VII - Facial movement intact bilaterally. VIII - Hearing & vestibular intact bilaterally. X - Palate elevates symmetrically. XI - Chin turning & shoulder shrug intact bilaterally. XII - Tongue protrusion intact.  Motor Strength - The patient's strength was normal in all extremities and pronator drift was  absent.  Bulk was normal and fasciculations were absent.   Motor Tone - Muscle tone was assessed at the neck and appendages and was normal.  Reflexes - The patient's reflexes were normal in all extremities and he had no pathological reflexes.  Sensory - Light touch, temperature/pinprick, vibration and proprioception, and Romberg testing were assessed and were normal.    Coordination - The patient had normal movements in the hands and feet with no ataxia or dysmetria.  Tremor was absent.  Gait and Station - The patient's transfers, posture, gait, station, and turns were observed as normal.  Data reviewed: I personally reviewed the images and agree with the radiology interpretations.  Dg Chest 2 View 12/27/2013 No active cardiopulmonary disease. Large hiatal hernia.   Ct Head Wo Contrast 12/27/2013 Negative CT of the head.   Mr Brain Wo Contrast 12/27/2013 Two areas of acute infarction within the right cerebellum, possibly of slightly different age. No evidence of significant swelling, hemorrhage or mass effect. No other acute infarction. Mild chronic small-vessel ischemia within the cerebral hemispheric white matter.   Mra Head Wo Contrast 12/27/2013 No posterior circulation abnormality identified at MR angiography.   MRA neck  1. Suboptimal visualization of the proximal subclavian arteries and  vertebral artery origins, but normal bilateral vertebral arteries  from the V2 segments to the vertebrobasilar junction.  2. No carotid stenosis in the neck. Tortuous cervical right ICA.  Carotid Doppler No evidence of hemodynamically significant internal carotid artery stenosis. Vertebral artery flow is antegrade.   2D Echocardiogram - Normal biventricular size and systolic function. Pseudonormal pattern of diastolic dysfunction with normal filling pressures. No significant valvular abnormality. EF 60-65%  Component     Latest Ref Rng 12/28/2013  Cholesterol      0 - 200 mg/dL 128  Triglycerides     <150 mg/dL 109  HDL     >39 mg/dL 38 (L)  Total CHOL/HDL Ratio      3.4  VLDL     0 - 40 mg/dL 22  LDL (calc)     0 - 99 mg/dL 68  Hgb A1c MFr Bld     <5.7 % 6.3 (H)  Mean Plasma Glucose     <117 mg/dL 134 (H)  Vitamin B-12     211 - 911 pg/mL 579    Assessment: As you may recall, he is a 74 y.o. Caucasian male with PMH of HLD and hiatal hernia was admitted 12/27/13 for two small right cerebellar stroke. Initially thought small vessel disease but he was found to have a flutter during admission, put on eliquis. He is on eliquis and simvastatin for stroke prevention now and neuro fully recovered. Follow up with cardiology for afib and aflutter, not candidate for ablation.   Plan:  - continue eliquis and zocor for stroke prevention - check BP at home  - Follow up with your primary care physician for stroke risk factor modification. Recommend maintain blood pressure goal <130/80, diabetes  with hemoglobin A1c goal below 6.5% and lipids with LDL cholesterol goal below 70 mg/dL.  - follow up with cardiology - consider to repeat CUS next visit - RTC in one year.  No orders of the defined types were placed in this encounter.    No orders of the defined types were placed in this encounter.    Patient Instructions  - continue eliquis and zocor for stroke prevention - check BP at home - Follow up with your primary care physician for stroke risk factor modification. Recommend maintain blood pressure goal <130/80, diabetes with hemoglobin A1c goal below 6.5% and lipids with LDL cholesterol goal below 70 mg/dL.  - follow up with cardiology - follow up in one year.    Rosalin Hawking, MD PhD [facility] [address], [place], [place] [address] [phone]

## 2014-09-26 NOTE — Patient Instructions (Signed)
-   continue eliquis and zocor for stroke prevention - check BP at home - Follow up with your primary care physician for stroke risk factor modification. Recommend maintain blood pressure goal <130/80, diabetes with hemoglobin A1c goal below 6.5% and lipids with LDL cholesterol goal below 70 mg/dL.  - follow up with cardiology - follow up in one year.

## 2014-10-03 ENCOUNTER — Other Ambulatory Visit: Payer: Self-pay | Admitting: Family Medicine

## 2014-10-03 DIAGNOSIS — Z87891 Personal history of nicotine dependence: Secondary | ICD-10-CM

## 2014-10-15 ENCOUNTER — Ambulatory Visit
Admission: RE | Admit: 2014-10-15 | Discharge: 2014-10-15 | Disposition: A | Discharge status: Home / Self Care | Payer: Medicare Other | Source: Ambulatory Visit | Attending: Family Medicine | Admitting: Family Medicine

## 2014-10-15 DIAGNOSIS — Z87891 Personal history of nicotine dependence: Secondary | ICD-10-CM

## 2014-10-31 ENCOUNTER — Ambulatory Visit: Payer: Medicare Other

## 2015-01-23 ENCOUNTER — Telehealth: Payer: Self-pay | Admitting: Cardiology

## 2015-01-23 NOTE — Telephone Encounter (Signed)
Pt calling requesting samples of Eliquis 5 mg tablet. I informed the pt the we did not have any samples of Eliquis 5 mg tablets at this time and if he would like to call later in the week to see if we get some samples of this medication in. I also gave him the medicare low-income subsidy program number [phone]. I advised the pt that if he has any other problems, questions or concerns to call our office. Pt verbalized understanding.

## 2015-02-01 ENCOUNTER — Telehealth: Payer: Self-pay | Admitting: Cardiology

## 2015-02-01 NOTE — Telephone Encounter (Signed)
Pt calling requesting samples of Eliquis 5 mg tablet. I informed the pt that we did not have any samples of Eliquis 5 mg tablets and that he could call back next Tuesday afternoon to see if we have gotten samples in. I advised the pt that if he has any other problems, questions or concerns to call the office. Pt verbalized understanding.

## 2015-03-11 ENCOUNTER — Other Ambulatory Visit: Payer: Self-pay | Admitting: Cardiology

## 2015-03-28 ENCOUNTER — Encounter: Payer: Self-pay | Admitting: Cardiology

## 2015-03-28 ENCOUNTER — Ambulatory Visit (INDEPENDENT_AMBULATORY_CARE_PROVIDER_SITE_OTHER): Payer: Medicare Other | Admitting: Cardiology

## 2015-03-28 VITALS — BP 126/62 | HR 65 | Ht 72.0 in | Wt 245.0 lb

## 2015-03-28 DIAGNOSIS — I10 Essential (primary) hypertension: Secondary | ICD-10-CM

## 2015-03-28 DIAGNOSIS — I5033 Acute on chronic diastolic (congestive) heart failure: Secondary | ICD-10-CM

## 2015-03-28 DIAGNOSIS — I480 Paroxysmal atrial fibrillation: Secondary | ICD-10-CM | POA: Diagnosis not present

## 2015-03-28 DIAGNOSIS — I4892 Unspecified atrial flutter: Secondary | ICD-10-CM

## 2015-03-28 DIAGNOSIS — E785 Hyperlipidemia, unspecified: Secondary | ICD-10-CM | POA: Diagnosis not present

## 2015-03-28 DIAGNOSIS — I5032 Chronic diastolic (congestive) heart failure: Secondary | ICD-10-CM | POA: Insufficient documentation

## 2015-03-28 MED ORDER — SIMVASTATIN 40 MG PO TABS: 40.0000 mg | ORAL_TABLET | Freq: Every day | ORAL | Status: DC

## 2015-03-28 MED ORDER — APIXABAN 5 MG PO TABS: 5.0000 mg | ORAL_TABLET | Freq: Two times a day (BID) | ORAL | Status: DC

## 2015-03-28 MED ORDER — FUROSEMIDE 40 MG PO TABS: 40.0000 mg | ORAL_TABLET | Freq: Every day | ORAL | Status: DC

## 2015-03-28 MED ORDER — DILTIAZEM HCL 60 MG PO TABS: 60.0000 mg | ORAL_TABLET | Freq: Two times a day (BID) | ORAL | Status: DC

## 2015-03-28 NOTE — Patient Instructions (Signed)
Medication Instructions:   INCREASE YOUR LASIX TO 40 MG ONCE DAILY   Labwork:  IN AROUND 2 MONTHS, PRIOR TO YOUR 2 MONTH FOLLOW-UP APPOINTMENT WITH DR NELSON---WILL CHECK--CMET AND LIPIDS---PLEASE COME FASTING TO THIS LAB APPOINTMENT    Follow-Up:  2 MONTHS WITH DR NELSON---PLEASE HAVE YOUR LABS DONE PRIOR TO THIS OFFICE VISIT      If you need a refill on your cardiac medications before your next appointment, please call your pharmacy.

## 2015-03-28 NOTE — Progress Notes (Signed)
Patient ID: Austin Taylor, male   DOB: 1940/11/26, 75 y.o.   MRN: [id number]    Patient Name: Austin Taylor Date of Encounter: 03/28/2015  Primary Care Provider:  Vena Austria, MD Primary Cardiologist:  Dorothy Spark  Problem List   Past Medical History  Diagnosis Date  . Hyperlipidemia    Past Surgical History  Procedure Laterality Date  . Rotator cuff repair Left 1998   Allergies  Allergies  Allergen Reactions  . Codeine     Nausea    Chief complain: DOE, LE edema  HPI  Austin Taylor is an 75 y.o. male h/o hyperlipidemia who presented on 10/14 with unsteady gait and slurred speech and was diagnosed with an acute cerebellar stroke.  He took all his medications that included an ASA 81 mg on 12/26/13. The patient went for a daily 2 mile walk with his brother and started to drive and felt he didn't have full control of his feet and still "not normal" and therefore decided to return home. He has no personal hx of MI, TIA, or stroke. He was a former smoker ~1ppd and quit in 1973 and only occasionally drinks EtOH. Pt has extensive family hx of father CABG and CAE in his 16s and mother with CHF. During this interview pt feels he is nearly back to his baseline.   This morning at 3 am he developed atrial flutter with variable block and ventricular rate 80-120 BPM. He denies palpitations but feels fatigue. His wife and son states that he seemed tired and short of breath on exertion in the last month. No LE edema, no orthopnea or PND. No chest pain.He was started on Eliquis and PO Cardizem.  The patient is coming for first post hospitalization visit. He has recovered completely from his stroke. He is very compliant with his medicines and denies any bleeding. No melena or bright red stools.he denies any palpitations or syncope. No lower extremity edema, shortness of breath or chest pain. His only complaint is cough with whitish sputum and he feels that he might be  related to postnasal drip.  03/28/2015 - the patient is coming after 1 year. He denies chest pain or palpitations. He hasn't been exercising the entire winter and gained some weight. Mild LE edema B/L, no orthopnea or PND. He has no bleeding completion patient was. Needed denies any blood or black tarry stools.   Home Medications  Prior to Admission medications   Medication Sig Start Date End Date Taking? Authorizing Provider  apixaban (ELIQUIS) 5 MG TABS tablet Take 1 tablet (5 mg total) by mouth 2 (two) times daily. 12/30/13  Yes Belkys A Regalado, MD  diltiazem (CARDIZEM SR) 60 MG 12 hr capsule Take 1 capsule (60 mg total) by mouth every 12 (twelve) hours. 12/30/13  Yes Belkys A Regalado, MD  ferrous sulfate 325 (65 FE) MG tablet Take 325 mg by mouth daily with breakfast.   Yes Historical Provider, MD  furosemide (LASIX) 20 MG tablet Take 1 tablet (20 mg total) by mouth daily. 12/30/13  Yes Belkys A Regalado, MD  simvastatin (ZOCOR) 40 MG tablet Take 40 mg by mouth daily.   Yes Historical Provider, MD    Family History  Family History  Problem Relation Age of Onset  . Heart disease Mother   . Heart disease Father   . Heart disease Brother   . Other Father     carotid artey stenosis    Social History  Social History  Social History  . Marital Status: Married    Spouse Name: N/A  . Number of Children: 2  . 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 Smoking status: Former Smoker    Quit date: 08/02/1974  . Smokeless tobacco: Never Used  . Alcohol Use: Yes     Comment: occassional  . Drug Use: No  . Sexual Activity: Not on file   Other Topics Concern  . Not on file   Social History Narrative   Patient is married with 2 children.   Patient is right handed.   Patient has 14 yrs of education.   Patient does not drink caffeine.     Review of Systems, as per HPI, otherwise negative General:  No  chills, fever, night sweats or weight changes.  Cardiovascular:  No chest pain, dyspnea on exertion, edema, orthopnea, palpitations, paroxysmal nocturnal dyspnea. Dermatological: No rash, lesions/masses Respiratory: No cough, dyspnea Urologic: No hematuria, dysuria Abdominal:   No nausea, vomiting, diarrhea, bright red blood per rectum, melena, or hematemesis Neurologic:  No visual changes, wkns, changes in mental status. All other systems reviewed and are otherwise negative except as noted above.  Physical Exam Blood pressure 110/60, heart rate 63 General: Pleasant, NAD Psych: Normal affect. Neuro: Alert and oriented X 3. Moves all extremities spontaneously. HEENT: Normal  Neck: Supple without bruits or JVD. Lungs:  Resp regular and unlabored, minimal crackles at both basis. Heart: iRRR no s3, s4, or murmurs. Abdomen: Soft, non-tender, non-distended, BS + x 4.  Extremities: No clubbing, cyanosis, mils B/L edema. DP/PT/Radials 2+ and equal bilaterally.  Labs:  No results for input(s): CKTOTAL, CKMB, TROPONINI in the last 72 hours. Lab Results  Component Value Date   WBC 6.9 12/27/2013   HGB 14.2 12/27/2013   HCT 41.4 12/27/2013   MCV 89.0 12/27/2013   PLT 204 12/27/2013    Lab Results  Component Value Date   DDIMER  09/21/2006    0.42        AT THE INHOUSE ESTABLISHED CUTOFF VALUE OF 0.48 ug/mL FEU, THIS ASSAY HAS BEEN DOCUMENTED IN THE LITERATURE TO HAVE   Invalid input(s): POCBNP    Component Value Date/Time   NA 139 03/28/2014 0919   K 4.2 03/28/2014 0919   CL 106 03/28/2014 0919   CO2 28 03/28/2014 0919   GLUCOSE 97 03/28/2014 0919   BUN 16 03/28/2014 0919   CREATININE 1.00 03/28/2014 0919   CALCIUM 9.3 03/28/2014 0919   PROT 7.4 03/28/2014 0919   ALBUMIN 4.0 03/28/2014 0919   AST 18 03/28/2014 0919   ALT 17 03/28/2014 0919   ALKPHOS 75 03/28/2014 0919   BILITOT 0.5 03/28/2014 0919   GFRNONAA 81* 12/29/2013 0936   GFRAA >90 12/29/2013 0936   Lab  Results  Component Value Date   CHOL 128 12/28/2013   HDL 38* 12/28/2013   LDLCALC 68 12/28/2013   TRIG 109 12/28/2013    Accessory Clinical Findings  Echocardiogram - 12/28/2013 Left ventricle: The cavity size was normal. There was mild concentric hypertrophy. Systolic function was normal. The estimated ejection fraction was in the range of 60% to 65%. Wall motion was normal; there were no regional wall motion abnormalities. Features are consistent with a pseudonormal left ventricular filling pattern, with concomitant abnormal relaxation and increased filling pressure (grade 2 diastolic dysfunction). There was no evidence of elevated ventricular filling pressure by Doppler parameters. - Aortic  valve: There was no regurgitation. - Aortic root: The aortic root was normal in size. - Mitral valve: There was mild regurgitation. - Left atrium: The atrium was mildly dilated. - Right ventricle: Systolic function was normal. - Right atrium: The atrium was normal in size. - Tricuspid valve: There was mild regurgitation. - Pulmonary arteries: Systolic pressure was within the normal range. - Inferior vena cava: The vessel was normal in size. - Pericardium, extracardiac: There was no pericardial effusion.  Impressions:  - Normal biventricular size and systolic function. Pseudonormal pattern of diastolic dysfunction with normal filling pressures. No significant valvular abnormality.  ECG - Atrial fibrillation with rate 65 BPM< nonspecific ST-T wave abnormalities., previously sinus bradycardia, first-degree AV block, otherwise normal.  Doppler; 1-39% ICA stenosis. Vertebral artery flow is antegrade.     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 Paroxysmal atrial fibrillation - the patient is already on Eliquis and rate controlled, we will continue the same regimen with diltiazem.   2.   Acute on chronic diastolic CHF - possibly sec to atrial fibrillation, we will increase lasix to 40 mg po daily, follow up in 2 months, if still in acute CHF, we will consider a DCCV, the patient has mildly dilated left atrium on echocardiogram in 2015.   3. H/o Paroxysmal atrial flutter - variable block.  Rate controlled on Cardizem SR 60 mg PO BID (borderline BP with recent stroke).  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 Lovena Le for apical consultation for possible ablation that didn't find him an appropriate candidate for ablation at this time as he goes between flutter and fibrillation. Continue Eliquis.  4. Hypertension - careful for now with recent stroke, however, moderate LVH on echocardiogram. He will need tight BP control.   Follow up in 2 months with CMP and lipids prior to the appointment. Dorothy Spark, MD, [facility] 03/28/2015, 9:21 AM

## 2015-05-16 ENCOUNTER — Other Ambulatory Visit: Payer: Self-pay | Admitting: Cardiology

## 2015-05-17 ENCOUNTER — Other Ambulatory Visit: Payer: Self-pay | Admitting: Cardiology

## 2015-05-28 ENCOUNTER — Other Ambulatory Visit (INDEPENDENT_AMBULATORY_CARE_PROVIDER_SITE_OTHER): Payer: Medicare Other | Admitting: *Deleted

## 2015-05-28 DIAGNOSIS — I5033 Acute on chronic diastolic (congestive) heart failure: Secondary | ICD-10-CM | POA: Diagnosis not present

## 2015-05-28 DIAGNOSIS — I480 Paroxysmal atrial fibrillation: Secondary | ICD-10-CM

## 2015-05-28 DIAGNOSIS — E785 Hyperlipidemia, unspecified: Secondary | ICD-10-CM

## 2015-05-28 LAB — COMPREHENSIVE METABOLIC PANEL
ALT: 19 U/L (ref 9–46)
AST: 13 U/L (ref 10–35)
Albumin: 3.9 g/dL (ref 3.6–5.1)
Alkaline Phosphatase: 68 U/L (ref 40–115)
BUN: 20 mg/dL (ref 7–25)
CO2: 27 mmol/L (ref 20–31)
Calcium: 8.9 mg/dL (ref 8.6–10.3)
Chloride: 102 mmol/L (ref 98–110)
Creat: 0.96 mg/dL (ref 0.70–1.18)
Glucose, Bld: 90 mg/dL (ref 65–99)
Potassium: 4.1 mmol/L (ref 3.5–5.3)
Sodium: 138 mmol/L (ref 135–146)
Total Bilirubin: 0.6 mg/dL (ref 0.2–1.2)
Total Protein: 6.8 g/dL (ref 6.1–8.1)

## 2015-05-28 LAB — LIPID PANEL
Cholesterol: 140 mg/dL (ref 125–200)
HDL: 44 mg/dL (ref >=40)
LDL Cholesterol: 77 mg/dL (ref <130)
Total CHOL/HDL Ratio: 3.2 Ratio (ref <=5.0)
Triglycerides: 95 mg/dL (ref <150)
VLDL: 19 mg/dL (ref <30)

## 2015-05-28 NOTE — Addendum Note (Signed)
Addended by: Eulis Foster on: 05/28/2015 07:54 AM   Modules accepted: Orders

## 2015-06-03 ENCOUNTER — Encounter: Payer: Self-pay | Admitting: Cardiology

## 2015-06-03 ENCOUNTER — Ambulatory Visit (INDEPENDENT_AMBULATORY_CARE_PROVIDER_SITE_OTHER): Payer: Medicare Other | Admitting: Cardiology

## 2015-06-03 VITALS — BP 120/62 | HR 68 | Ht 72.0 in | Wt 236.0 lb

## 2015-06-03 DIAGNOSIS — I110 Hypertensive heart disease with heart failure: Secondary | ICD-10-CM | POA: Diagnosis not present

## 2015-06-03 DIAGNOSIS — I5033 Acute on chronic diastolic (congestive) heart failure: Secondary | ICD-10-CM

## 2015-06-03 DIAGNOSIS — I4819 Other persistent atrial fibrillation: Secondary | ICD-10-CM

## 2015-06-03 DIAGNOSIS — I481 Persistent atrial fibrillation: Secondary | ICD-10-CM

## 2015-06-03 DIAGNOSIS — I4892 Unspecified atrial flutter: Secondary | ICD-10-CM | POA: Diagnosis not present

## 2015-06-03 NOTE — Patient Instructions (Signed)
Medication Instructions:   Your physician recommends that you continue on your current medications as directed. Please refer to the Current Medication list given to you today.     Follow-Up:  3 MONTHS WITH DR NELSON       If you need a refill on your cardiac medications before your next appointment, please call your pharmacy.   

## 2015-06-03 NOTE — Progress Notes (Signed)
Patient ID: TAIT LEGARDA, male   DOB: 10-15-1940, 76 y.o.   MRN: [id number]     Patient Name: Austin Taylor Date of Encounter: 06/03/2015  Primary Care Provider:  Vena Austria, MD Primary Cardiologist:  Dorothy Spark  Problem List   Past Medical History  Diagnosis Date  . Hyperlipidemia    Past Surgical History  Procedure Laterality Date  . Rotator cuff repair Left 1998   Allergies  Allergies  Allergen Reactions  . Codeine     Nausea    Chief complain: DOE, LE edema  HPI  UNIQUE DARTY is an 75 y.o. male h/o hyperlipidemia who presented on 10/14 with unsteady gait and slurred speech and was diagnosed with an acute cerebellar stroke.  He took all his medications that included an ASA 81 mg on 12/26/13. The patient went for a daily 2 mile walk with his brother and started to drive and felt he didn't have full control of his feet and still "not normal" and therefore decided to return home. He has no personal hx of MI, TIA, or stroke. He was a former smoker ~1ppd and quit in 1973 and only occasionally drinks EtOH. Pt has extensive family hx of father CABG and CAE in his 46s and mother with CHF. During this interview pt feels he is nearly back to his baseline.   This morning at 3 am he developed atrial flutter with variable block and ventricular rate 80-120 BPM. He denies palpitations but feels fatigue. His wife and son states that he seemed tired and short of breath on exertion in the last month. No LE edema, no orthopnea or PND. No chest pain.He was started on Eliquis and PO Cardizem.  The patient is coming for first post hospitalization visit. He has recovered completely from his stroke. He is very compliant with his medicines and denies any bleeding. No melena or bright red stools.he denies any palpitations or syncope. No lower extremity edema, shortness of breath or chest pain. His only complaint is cough with whitish sputum and he feels that he might be  related to postnasal drip.  06/03/2015 - the patient is coming after  2 months,  At the last visit he was found to be fluid overloaded and his Lasix was increased to 40 mg daily, today he is -9 pounds. He denies any lower extremity edema, orthopnea, paroxysmal nocturnal dyspnea, however he states that ever since stroke he stopped exercising, he used to walk 2 miles a day. Right now he is limited by leg pain and fatigue. He gets short of breath with moderate to severe exertion. He is able to accomplish housework with some breaks.  He denies any palpitations or syncope and denies any bleeding problems with Eliquis.  Home Medications  Prior to Admission medications   Medication Sig Start Date End Date Taking? Authorizing Provider  apixaban (ELIQUIS) 5 MG TABS tablet Take 1 tablet (5 mg total) by mouth 2 (two) times daily. 12/30/13  Yes Belkys A Regalado, MD  diltiazem (CARDIZEM SR) 60 MG 12 hr capsule Take 1 capsule (60 mg total) by mouth every 12 (twelve) hours. 12/30/13  Yes Belkys A Regalado, MD  ferrous sulfate 325 (65 FE) MG tablet Take 325 mg by mouth daily with breakfast.   Yes Historical Provider, MD  furosemide (LASIX) 20 MG tablet Take 1 tablet (20 mg total) by mouth daily. 12/30/13  Yes Belkys A Regalado, MD  simvastatin (ZOCOR) 40 MG tablet Take 40 mg by mouth daily.  Yes Historical Provider, MD    Family History  Family History  Problem Relation Age of Onset  . Heart disease Mother   . Heart disease Father   . Heart disease Brother   . Other Father     carotid artey stenosis    Social History  Social History   Social History  . Marital Status: Married    Spouse Name: N/A  . Number of Children: 2  . 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 Smoking status: Former Smoker    Quit date: 08/02/1974  . Smokeless tobacco: Never Used  . Alcohol Use: Yes     Comment: occassional  . Drug Use: No  .  Sexual Activity: Not on file   Other Topics Concern  . Not on file   Social History Narrative   Patient is married with 2 children.   Patient is right handed.   Patient has 14 yrs of education.   Patient does not drink caffeine.     Review of Systems, as per HPI, otherwise negative General:  No chills, fever, night sweats or weight changes.  Cardiovascular:  No chest pain, dyspnea on exertion, edema, orthopnea, palpitations, paroxysmal nocturnal dyspnea. Dermatological: No rash, lesions/masses Respiratory: No cough, dyspnea Urologic: No hematuria, dysuria Abdominal:   No nausea, vomiting, diarrhea, bright red blood per rectum, melena, or hematemesis Neurologic:  No visual changes, wkns, changes in mental status. All other systems reviewed and are otherwise negative except as noted above.  Physical Exam Blood pressure 110/60, heart rate 63 General: Pleasant, NAD Psych: Normal affect. Neuro: Alert and oriented X 3. Moves all extremities spontaneously. HEENT: Normal  Neck: Supple without bruits or JVD. Lungs:  Resp regular and unlabored, minimal crackles at both basis. Heart: iRRR no s3, s4, or murmurs. Abdomen: Soft, non-tender, non-distended, BS + x 4.  Extremities: No clubbing, cyanosis, mils B/L edema. DP/PT/Radials 2+ and equal bilaterally.  Labs:  No results for input(s): CKTOTAL, CKMB, TROPONINI in the last 72 hours. Lab Results  Component Value Date   WBC 6.9 12/27/2013   HGB 14.2 12/27/2013   HCT 41.4 12/27/2013   MCV 89.0 12/27/2013   PLT 204 12/27/2013    Lab Results  Component Value Date   DDIMER  09/21/2006    0.42        AT THE INHOUSE ESTABLISHED CUTOFF VALUE OF 0.48 ug/mL FEU, THIS ASSAY HAS BEEN DOCUMENTED IN THE LITERATURE TO HAVE   Invalid input(s): POCBNP    Component Value Date/Time   NA 138 05/28/2015 0754   K 4.1 05/28/2015 0754   CL 102 05/28/2015 0754   CO2 27 05/28/2015 0754   GLUCOSE 90 05/28/2015 0754   BUN 20 05/28/2015 0754    CREATININE 0.96 05/28/2015 0754   CREATININE 1.00 03/28/2014 0919   CALCIUM 8.9 05/28/2015 0754   PROT 6.8 05/28/2015 0754   ALBUMIN 3.9 05/28/2015 0754   AST 13 05/28/2015 0754   ALT 19 05/28/2015 0754   ALKPHOS 68 05/28/2015 0754   BILITOT 0.6 05/28/2015 0754   GFRNONAA 81* 12/29/2013 0936   GFRAA >90 12/29/2013 0936   Lab Results  Component Value Date   CHOL 140 05/28/2015   HDL 44 05/28/2015   LDLCALC 77 05/28/2015   TRIG 95 05/28/2015    Accessory Clinical Findings  Echocardiogram - 12/28/2013 Left ventricle: The cavity size was normal. There was mild concentric hypertrophy.  Systolic function was normal. The estimated ejection fraction was in the range of 60% to 65%. Wall motion was normal; there were no regional wall motion abnormalities. Features are consistent with a pseudonormal left ventricular filling pattern, with concomitant abnormal relaxation and increased filling pressure (grade 2 diastolic dysfunction). There was no evidence of elevated ventricular filling pressure by Doppler parameters. - Aortic valve: There was no regurgitation. - Aortic root: The aortic root was normal in size. - Mitral valve: There was mild regurgitation. - Left atrium: The atrium was mildly dilated. - Right ventricle: Systolic function was normal. - Right atrium: The atrium was normal in size. - Tricuspid valve: There was mild regurgitation. - Pulmonary arteries: Systolic pressure was within the normal range. - Inferior vena cava: The vessel was normal in size. - Pericardium, extracardiac: There was no pericardial effusion.  Impressions:  - Normal biventricular size and systolic function. Pseudonormal pattern of diastolic dysfunction with normal filling pressures. No significant valvular abnormality.  ECG - Atrial fibrillation with rate 65 BPM< nonspecific ST-T wave abnormalities., previously sinus bradycardia, first-degree AV block, otherwise  normal.  Doppler; 1-39% ICA stenosis. Vertebral artery flow is antegrade.     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 Paroxysmal atrial fibrillation - the patient is already on Eliquis and rate controlled, we will continue the same regimen with diltiazem.   The patient has baseline dyspnea on exertion, he is encouraged to start exercising and walking again, he is currently rate controlled and has no problems with Eliquis.   His long-term goal will be rate control as he seemed to tolerate it well, if he is dyspnea on exertion doesn't improve in the future we might consider cardioversion.  2.  Acute on chronic diastolic CHF - possibly sec to atrial fibrillation,  He has lost 9 pounds with decreased lasix of 40 mg po daily,  He is electrolytes and creatinine was normal to go. We'll continue the same regimen.  3. H/o Paroxysmal atrial flutter - variable block.  Rate controlled on Cardizem SR 60 mg PO BID (borderline BP with recent stroke).  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 Lovena Le for apical consultation for possible ablation that didn't find him an appropriate candidate for ablation at this time as he goes between flutter and fibrillation. Continue Eliquis.  4. Hypertension -  Well-controlled.  Follow up in  3 months.   Dorothy Spark, MD, [facility] 06/03/2015, 9:40 AM

## 2015-06-04 ENCOUNTER — Ambulatory Visit: Payer: Medicare Other | Admitting: Cardiology

## 2015-09-04 ENCOUNTER — Ambulatory Visit (INDEPENDENT_AMBULATORY_CARE_PROVIDER_SITE_OTHER): Payer: Medicare Other | Admitting: Cardiology

## 2015-09-04 ENCOUNTER — Encounter: Payer: Self-pay | Admitting: Cardiology

## 2015-09-04 ENCOUNTER — Encounter: Payer: Self-pay | Admitting: *Deleted

## 2015-09-04 VITALS — BP 112/60 | HR 73 | Ht 72.0 in | Wt 240.0 lb

## 2015-09-04 DIAGNOSIS — I481 Persistent atrial fibrillation: Secondary | ICD-10-CM

## 2015-09-04 DIAGNOSIS — I480 Paroxysmal atrial fibrillation: Secondary | ICD-10-CM | POA: Diagnosis not present

## 2015-09-04 DIAGNOSIS — I5033 Acute on chronic diastolic (congestive) heart failure: Secondary | ICD-10-CM

## 2015-09-04 DIAGNOSIS — I110 Hypertensive heart disease with heart failure: Secondary | ICD-10-CM

## 2015-09-04 DIAGNOSIS — E785 Hyperlipidemia, unspecified: Secondary | ICD-10-CM | POA: Diagnosis not present

## 2015-09-04 DIAGNOSIS — Z01812 Encounter for preprocedural laboratory examination: Secondary | ICD-10-CM | POA: Insufficient documentation

## 2015-09-04 DIAGNOSIS — I4819 Other persistent atrial fibrillation: Secondary | ICD-10-CM

## 2015-09-04 LAB — BASIC METABOLIC PANEL
BUN: 17 mg/dL (ref 7–25)
CO2: 25 mmol/L (ref 20–31)
Calcium: 8.8 mg/dL (ref 8.6–10.3)
Chloride: 103 mmol/L (ref 98–110)
Creat: 1.06 mg/dL (ref 0.70–1.18)
Glucose, Bld: 100 mg/dL — ABNORMAL HIGH (ref 65–99)
Potassium: 4.2 mmol/L (ref 3.5–5.3)
Sodium: 141 mmol/L (ref 135–146)

## 2015-09-04 NOTE — Patient Instructions (Signed)
Medication Instructions:   Your physician recommends that you continue on your current medications as directed. Please refer to the Current Medication list given to you today.   Labwork:  TODAY FOR PRE-PROCEDURE LAB EXAM FOR YOUR SCHEDULED CARDIOVERSION SET FOR 09/09/15.  [place] A BMET.    Testing/Procedures:  Your physician has requested that you have a Cardioversion.  Electrical Cardioversion uses a jolt of electricity to your heart either through paddles or wired patches attached to your chest. This is a controlled, usually prescheduled, procedure. This procedure is done at the hospital and you are not awake during the procedure. You usually go home the day of the procedure. Please see the instruction sheet given to you today for more information.  YOUR CARDIOVERSION IS SCHEDULED FOR Monday 09/09/15 AT 8 AM WITH DR ROSS AT [facility].  YOU MUST ARRIVE AT [place] (SHORT STAY) AT 6:30 AM ON THE DAY OF YOUR SCHEDULED CARDIOVERSION FOR 09/09/15.      Follow-Up:  DR Meda Coffee WOULD LIKE FOR YOU TO BE SEEN EITHER ON 09/25/15 AT 9:30 AM OR ON 09/30/15 AT 9:30 AM--THIS IS OKAYED BY DR NELSON IF THIS IS A DOUBLE BOOK.       If you need a refill on your cardiac medications before your next appointment, please call your pharmacy.

## 2015-09-04 NOTE — Progress Notes (Signed)
Patient ID: Austin Taylor, male   DOB: [birth year], 75 y.o.   MRN: [id number]    Patient Name: Austin Taylor Date of Encounter: 09/04/2015  Primary Care Provider:  Vena Austria, MD Primary Cardiologist:  Ena Dawley  Problem List   Past Medical History  Diagnosis Date  . Hyperlipidemia    Past Surgical History  Procedure Laterality Date  . Rotator cuff repair Left 1998   Allergies  Allergies  Allergen Reactions  . Codeine     Nausea    Chief complain: DOE, LE edema  HPI  Austin Taylor is an 75 y.o. male h/o hyperlipidemia who presented on 10/14 with unsteady gait and slurred speech and was diagnosed with an acute cerebellar stroke.  He took all his medications that included an ASA 81 mg on 12/26/13. The patient went for a daily 2 mile walk with his brother and started to drive and felt he didn't have full control of his feet and still "not normal" and therefore decided to return home. He has no personal hx of MI, TIA, or stroke. He was a former smoker ~1ppd and quit in 1973 and only occasionally drinks EtOH. Pt has extensive family hx of father CABG and CAE in his 64s and mother with CHF. During this interview pt feels he is nearly back to his baseline.   This morning at 3 am he developed atrial flutter with variable block and ventricular rate 80-120 BPM. He denies palpitations but feels fatigue. His wife and son states that he seemed tired and short of breath on exertion in the last month. No LE edema, no orthopnea or PND. No chest pain.He was started on Eliquis and PO Cardizem.  The patient is coming for first post hospitalization visit. He has recovered completely from his stroke. He is very compliant with his medicines and denies any bleeding. No melena or bright red stools.he denies any palpitations or syncope. No lower extremity edema, shortness of breath or chest pain. His only complaint is cough with whitish sputum and he feels that he might be related  to postnasal drip.  06/03/2015 - the patient is coming after  2 months,  At the last visit he was found to be fluid overloaded and his Lasix was increased to 40 mg daily, today he is -9 pounds. He denies any lower extremity edema, orthopnea, paroxysmal nocturnal dyspnea, however he states that ever since stroke he stopped exercising, he used to walk 2 miles a day. Right now he is limited by leg pain and fatigue. He gets short of breath with moderate to severe exertion. He is able to accomplish housework with some breaks.  He denies any palpitations or syncope and denies any bleeding problems with Eliquis.  09/04/2015, the patient is coming after 3 months, he states that he feels okay while at rest but with minimal physical activity he gets overly tired and short of breath. He denies any orthopnea or paroxysmal nocturnal dyspnea. He has been taking higher dose of Lasix 40 mg daily however hasn't lost any weight. He continues to walk. He states that year ago when he was not in atrial fibrillation he felt like he had significantly more energy. He has no side effects from his medicines and no bleeding with Eliquis.  Home Medications  Prior to Admission medications   Medication Sig Start Date End Date Taking? Authorizing Provider  apixaban (ELIQUIS) 5 MG TABS tablet Take 1 tablet (5 mg total) by mouth 2 (two) times daily. 12/30/13  Yes Belkys A Regalado, MD  diltiazem (CARDIZEM SR) 60 MG 12 hr capsule Take 1 capsule (60 mg total) by mouth every 12 (twelve) hours. 12/30/13  Yes Belkys A Regalado, MD  ferrous sulfate 325 (65 FE) MG tablet Take 325 mg by mouth daily with breakfast.   Yes Historical Provider, MD  furosemide (LASIX) 20 MG tablet Take 1 tablet (20 mg total) by mouth daily. 12/30/13  Yes Belkys A Regalado, MD  simvastatin (ZOCOR) 40 MG tablet Take 40 mg by mouth daily.   Yes Historical Provider, MD    Family History  Family History  Problem Relation Age of Onset  . Heart disease Mother   .  Heart disease Father   . Heart disease Brother   . Other Father     carotid artey stenosis    Social History  Social History   Social History  . Marital Status: Married    Spouse Name: N/A  . Number of Children: 2  . 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 Smoking status: Former Smoker    Quit date: 08/02/1974  . Smokeless tobacco: Never Used  . Alcohol Use: Yes     Comment: occassional  . Drug Use: No  . Sexual Activity: Not on file   Other Topics Concern  . Not on file   Social History Narrative   Patient is married with 2 children.   Patient is right handed.   Patient has 14 yrs of education.   Patient does not drink caffeine.     Review of Systems, as per HPI, otherwise negative General:  No chills, fever, night sweats or weight changes.  Cardiovascular:  No chest pain, dyspnea on exertion, edema, orthopnea, palpitations, paroxysmal nocturnal dyspnea. Dermatological: No rash, lesions/masses Respiratory: No cough, dyspnea Urologic: No hematuria, dysuria Abdominal:   No nausea, vomiting, diarrhea, bright red blood per rectum, melena, or hematemesis Neurologic:  No visual changes, wkns, changes in mental status. All other systems reviewed and are otherwise negative except as noted above.  Physical Exam Blood pressure 110/60, heart rate 63 General: Pleasant, NAD Psych: Normal affect. Neuro: Alert and oriented X 3. Moves all extremities spontaneously. HEENT: Normal  Neck: Supple without bruits or JVD. Lungs:  Resp regular and unlabored, minimal crackles at both basis. Heart: iRRR no s3, s4, or murmurs. Abdomen: Soft, non-tender, non-distended, BS + x 4.  Extremities: No clubbing, cyanosis, mils B/L edema. DP/PT/Radials 2+ and equal bilaterally.  Labs:  No results for input(s): CKTOTAL, CKMB, TROPONINI in the last 72 hours. Lab Results  Component Value Date   WBC 6.9 12/27/2013   HGB  14.2 12/27/2013   HCT 41.4 12/27/2013   MCV 89.0 12/27/2013   PLT 204 12/27/2013    Lab Results  Component Value Date   DDIMER  09/21/2006    0.42        AT THE INHOUSE ESTABLISHED CUTOFF VALUE OF 0.48 ug/mL FEU, THIS ASSAY HAS BEEN DOCUMENTED IN THE LITERATURE TO HAVE   Invalid input(s): POCBNP    Component Value Date/Time   NA 138 05/28/2015 0754   K 4.1 05/28/2015 0754   CL 102 05/28/2015 0754   CO2 27 05/28/2015 0754   GLUCOSE 90 05/28/2015 0754   BUN 20 05/28/2015 0754   CREATININE 0.96 05/28/2015 0754   CREATININE 1.00 03/28/2014 0919   CALCIUM 8.9 05/28/2015 0754   PROT 6.8 05/28/2015 0754  ALBUMIN 3.9 05/28/2015 0754   AST 13 05/28/2015 0754   ALT 19 05/28/2015 0754   ALKPHOS 68 05/28/2015 0754   BILITOT 0.6 05/28/2015 0754   GFRNONAA 81* 12/29/2013 0936   GFRAA >90 12/29/2013 0936   Lab Results  Component Value Date   CHOL 140 05/28/2015   HDL 44 05/28/2015   LDLCALC 77 05/28/2015   TRIG 95 05/28/2015    Accessory Clinical Findings  Echocardiogram - 12/28/2013 Left ventricle: The cavity size was normal. There was mild concentric hypertrophy. Systolic function was normal. The estimated ejection fraction was in the range of 60% to 65%. Wall motion was normal; there were no regional wall motion abnormalities. Features are consistent with a pseudonormal left ventricular filling pattern, with concomitant abnormal relaxation and increased filling pressure (grade 2 diastolic dysfunction). There was no evidence of elevated ventricular filling pressure by Doppler parameters. - Aortic valve: There was no regurgitation. - Aortic root: The aortic root was normal in size. - Mitral valve: There was mild regurgitation. - Left atrium: The atrium was mildly dilated. - Right ventricle: Systolic function was normal. - Right atrium: The atrium was normal in size. - Tricuspid valve: There was mild regurgitation. - Pulmonary arteries: Systolic  pressure was within the normal range. - Inferior vena cava: The vessel was normal in size. - Pericardium, extracardiac: There was no pericardial effusion.  Impressions:  - Normal biventricular size and systolic function. Pseudonormal pattern of diastolic dysfunction with normal filling pressures. No significant valvular abnormality.  ECG - Atrial fibrillation with rate 65 BPM< nonspecific ST-T wave abnormalities., previously sinus bradycardia, first-degree AV block, otherwise normal.  Doppler; 1-39% ICA stenosis. Vertebral artery flow is antegrade.     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 Paroxysmal atrial fibrillation - the patient is already on Eliquis and rate controlled, However with grade 2 diastolic dysfunction he's not tolerating atrial fibrillation very well. He has never had a cardioversion performed, we will give it a try and reevaluate him in few weeks to see if symptoms have improved. Also if he is able to hold sinus rhythm for at least few weeks we will refer him again to the A. fib clinic for possible ablation.  2.  Acute on chronic diastolic CHF - possibly sec to atrial fibrillation,  he appears euvolemic we'll continue the same dose of Lasix.   3. H/o Paroxysmal atrial flutter - variable block.  Rate controlled on Cardizem SR 60 mg PO BID (borderline BP with recent stroke).  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 Lovena Le for apical consultation for possible ablation that didn't find him an appropriate candidate for ablation at this time as he goes between flutter and fibrillation. We will reconsider if he's able to hold sinus rhythm after cardioversion. Continue Eliquis.  4. Hypertension -  Well-controlled.  Follow up in  3 -4 weeks.   Ena Dawley, MD, [facility] 09/04/2015, 8:48 AM

## 2015-09-09 ENCOUNTER — Encounter (HOSPITAL_COMMUNITY): Payer: Self-pay | Admitting: *Deleted

## 2015-09-09 ENCOUNTER — Other Ambulatory Visit: Payer: Self-pay

## 2015-09-09 ENCOUNTER — Ambulatory Visit (HOSPITAL_COMMUNITY): Payer: Medicare Other | Admitting: Anesthesiology

## 2015-09-09 ENCOUNTER — Encounter (HOSPITAL_COMMUNITY)
Admission: RE | Disposition: A | Discharge status: Home / Self Care | Payer: Self-pay | Source: Ambulatory Visit | Attending: Internal Medicine

## 2015-09-09 ENCOUNTER — Ambulatory Visit (HOSPITAL_COMMUNITY)
Admission: RE | Admit: 2015-09-09 | Discharge: 2015-09-09 | Disposition: A | Discharge status: Home / Self Care | Payer: Medicare Other | Source: Ambulatory Visit | Attending: Internal Medicine | Admitting: Internal Medicine

## 2015-09-09 DIAGNOSIS — I4892 Unspecified atrial flutter: Secondary | ICD-10-CM | POA: Diagnosis not present

## 2015-09-09 DIAGNOSIS — I4819 Other persistent atrial fibrillation: Secondary | ICD-10-CM | POA: Insufficient documentation

## 2015-09-09 DIAGNOSIS — I5032 Chronic diastolic (congestive) heart failure: Secondary | ICD-10-CM | POA: Insufficient documentation

## 2015-09-09 DIAGNOSIS — I4891 Unspecified atrial fibrillation: Secondary | ICD-10-CM | POA: Diagnosis present

## 2015-09-09 DIAGNOSIS — Z7901 Long term (current) use of anticoagulants: Secondary | ICD-10-CM | POA: Insufficient documentation

## 2015-09-09 DIAGNOSIS — I110 Hypertensive heart disease with heart failure: Secondary | ICD-10-CM | POA: Insufficient documentation

## 2015-09-09 DIAGNOSIS — Z8673 Personal history of transient ischemic attack (TIA), and cerebral infarction without residual deficits: Secondary | ICD-10-CM | POA: Insufficient documentation

## 2015-09-09 DIAGNOSIS — I481 Persistent atrial fibrillation: Secondary | ICD-10-CM

## 2015-09-09 DIAGNOSIS — Z87891 Personal history of nicotine dependence: Secondary | ICD-10-CM | POA: Insufficient documentation

## 2015-09-09 DIAGNOSIS — I480 Paroxysmal atrial fibrillation: Secondary | ICD-10-CM | POA: Diagnosis not present

## 2015-09-09 DIAGNOSIS — Z79899 Other long term (current) drug therapy: Secondary | ICD-10-CM | POA: Diagnosis not present

## 2015-09-09 HISTORY — PX: CARDIOVERSION: SHX1299

## 2015-09-09 SURGERY — CARDIOVERSION
Anesthesia: General

## 2015-09-09 MED ORDER — PROPOFOL 10 MG/ML IV BOLUS
INTRAVENOUS | Status: DC | PRN
  Administered 2015-09-09: 100 mg via INTRAVENOUS

## 2015-09-09 MED ORDER — SODIUM CHLORIDE 0.9 % IV SOLN
INTRAVENOUS | Status: DC
  Administered 2015-09-09 (×2): via INTRAVENOUS

## 2015-09-09 MED ORDER — LIDOCAINE HCL (CARDIAC) 20 MG/ML IV SOLN
INTRAVENOUS | Status: DC | PRN
  Administered 2015-09-09: 50 mg via INTRATRACHEAL

## 2015-09-09 NOTE — Anesthesia Postprocedure Evaluation (Signed)
Anesthesia Post Note  Patient: RITHVIK WALWORTH  Procedure(s) Performed: Procedure(s) (LRB): CARDIOVERSION (N/A)  Patient location during evaluation: PACU Anesthesia Type: General Level of consciousness: awake and alert Pain management: pain level controlled Vital Signs Assessment: post-procedure vital signs reviewed and stable Respiratory status: spontaneous breathing, nonlabored ventilation, respiratory function stable and patient connected to nasal cannula oxygen Cardiovascular status: blood pressure returned to baseline and stable Postop Assessment: no signs of nausea or vomiting Anesthetic complications: no    Last Vitals:  Filed Vitals:   09/09/15 0850 09/09/15 0900  BP: 131/105 132/78  Pulse: 60 58  Temp:    Resp: 18 22    Last Pain: There were no vitals filed for this visit.               [place]

## 2015-09-09 NOTE — Discharge Instructions (Signed)
Electrical Cardioversion, Care After °Refer to this sheet in the next few weeks. These instructions provide you with information on caring for yourself after your procedure. Your health care provider may also give you more specific instructions. Your treatment has been planned according to current medical practices, but problems sometimes occur. Call your health care provider if you have any problems or questions after your procedure. °WHAT TO EXPECT AFTER THE PROCEDURE °After your procedure, it is typical to have the following sensations: °· Some redness on the skin where the shocks were delivered. If this is tender, a sunburn lotion or hydrocortisone cream may help. °· Possible return of an abnormal heart rhythm within hours or days after the procedure. °HOME CARE INSTRUCTIONS °· Take medicines only as directed by your health care provider. Be sure you understand how and when to take your medicine. °· Learn how to feel your pulse and check it often. °· Limit your activity for 48 hours after the procedure or as directed by your health care provider. °· Avoid or minimize caffeine and other stimulants as directed by your health care provider. °SEEK MEDICAL CARE IF: °· You feel like your heart is beating too fast or your pulse is not regular. °· You have any questions about your medicines. °· You have bleeding that will not stop. °SEEK IMMEDIATE MEDICAL CARE IF: °· You are dizzy or feel faint. °· It is hard to breathe or you feel short of breath. °· There is a change in discomfort in your chest. °· Your speech is slurred or you have trouble moving an arm or leg on one side of your body. °· You get a serious muscle cramp that does not go away. °· Your fingers or toes turn cold or blue. °  °This information is not intended to replace advice given to you by your health care provider. Make sure you discuss any questions you have with your health care provider. °  °Document Released: 12/21/2012 Document Revised: 03/23/2014  Document Reviewed: 12/21/2012 °Elsevier Interactive Patient Education ©2016 [employer] ° °

## 2015-09-09 NOTE — Op Note (Signed)
Pt anesthetized by anesthesia with Propofol  With pads in AP position pt cardioverted to SR with 150 J synchronized biphasic energy Procedure is without complication 12 lead EKG pending.

## 2015-09-09 NOTE — Interval H&P Note (Signed)
History and Physical Interval Note:  09/09/2015 8:11 AM  Austin Taylor  has presented today for surgery, with the diagnosis of AFIB  The various methods of treatment have been discussed with the patient and family. After consideration of risks, benefits and other options for treatment, the patient has consented to  Procedure(s): CARDIOVERSION (N/A) as a surgical intervention .  The patient's history has been reviewed, patient examined, no change in status, stable for surgery.  I have reviewed the patient's chart and labs.  Questions were answered to the patient's satisfaction.     Dorris Carnes

## 2015-09-09 NOTE — H&P (View-Only) (Signed)
Patient ID: Austin Taylor, male   DOB: [birth year], 75 y.o.   MRN: [id number]    Patient Name: Austin Taylor Date of Encounter: 09/04/2015  Primary Care Provider:  Vena Austria, MD Primary Cardiologist:  Ena Dawley  Problem List   Past Medical History  Diagnosis Date  . Hyperlipidemia    Past Surgical History  Procedure Laterality Date  . Rotator cuff repair Left 1998   Allergies  Allergies  Allergen Reactions  . Codeine     Nausea    Chief complain: DOE, LE edema  HPI  Austin Taylor is an 75 y.o. male h/o hyperlipidemia who presented on 10/14 with unsteady gait and slurred speech and was diagnosed with an acute cerebellar stroke.  He took all his medications that included an ASA 81 mg on 12/26/13. The patient went for a daily 2 mile walk with his brother and started to drive and felt he didn't have full control of his feet and still "not normal" and therefore decided to return home. He has no personal hx of MI, TIA, or stroke. He was a former smoker ~1ppd and quit in 1973 and only occasionally drinks EtOH. Pt has extensive family hx of father CABG and CAE in his 64s and mother with CHF. During this interview pt feels he is nearly back to his baseline.   This morning at 3 am he developed atrial flutter with variable block and ventricular rate 80-120 BPM. He denies palpitations but feels fatigue. His wife and son states that he seemed tired and short of breath on exertion in the last month. No LE edema, no orthopnea or PND. No chest pain.He was started on Eliquis and PO Cardizem.  The patient is coming for first post hospitalization visit. He has recovered completely from his stroke. He is very compliant with his medicines and denies any bleeding. No melena or bright red stools.he denies any palpitations or syncope. No lower extremity edema, shortness of breath or chest pain. His only complaint is cough with whitish sputum and he feels that he might be related  to postnasal drip.  06/03/2015 - the patient is coming after  2 months,  At the last visit he was found to be fluid overloaded and his Lasix was increased to 40 mg daily, today he is -9 pounds. He denies any lower extremity edema, orthopnea, paroxysmal nocturnal dyspnea, however he states that ever since stroke he stopped exercising, he used to walk 2 miles a day. Right now he is limited by leg pain and fatigue. He gets short of breath with moderate to severe exertion. He is able to accomplish housework with some breaks.  He denies any palpitations or syncope and denies any bleeding problems with Eliquis.  09/04/2015, the patient is coming after 3 months, he states that he feels okay while at rest but with minimal physical activity he gets overly tired and short of breath. He denies any orthopnea or paroxysmal nocturnal dyspnea. He has been taking higher dose of Lasix 40 mg daily however hasn't lost any weight. He continues to walk. He states that year ago when he was not in atrial fibrillation he felt like he had significantly more energy. He has no side effects from his medicines and no bleeding with Eliquis.  Home Medications  Prior to Admission medications   Medication Sig Start Date End Date Taking? Authorizing Provider  apixaban (ELIQUIS) 5 MG TABS tablet Take 1 tablet (5 mg total) by mouth 2 (two) times daily. 12/30/13  Yes Belkys A Regalado, MD  diltiazem (CARDIZEM SR) 60 MG 12 hr capsule Take 1 capsule (60 mg total) by mouth every 12 (twelve) hours. 12/30/13  Yes Belkys A Regalado, MD  ferrous sulfate 325 (65 FE) MG tablet Take 325 mg by mouth daily with breakfast.   Yes Historical Provider, MD  furosemide (LASIX) 20 MG tablet Take 1 tablet (20 mg total) by mouth daily. 12/30/13  Yes Belkys A Regalado, MD  simvastatin (ZOCOR) 40 MG tablet Take 40 mg by mouth daily.   Yes Historical Provider, MD    Family History  Family History  Problem Relation Age of Onset  . Heart disease Mother   .  Heart disease Father   . Heart disease Brother   . Other Father     carotid artey stenosis    Social History  Social History   Social History  . Marital Status: Married    Spouse Name: N/A  . Number of Children: 2  . 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 Smoking status: Former Smoker    Quit date: 08/02/1974  . Smokeless tobacco: Never Used  . Alcohol Use: Yes     Comment: occassional  . Drug Use: No  . Sexual Activity: Not on file   Other Topics Concern  . Not on file   Social History Narrative   Patient is married with 2 children.   Patient is right handed.   Patient has 14 yrs of education.   Patient does not drink caffeine.     Review of Systems, as per HPI, otherwise negative General:  No chills, fever, night sweats or weight changes.  Cardiovascular:  No chest pain, dyspnea on exertion, edema, orthopnea, palpitations, paroxysmal nocturnal dyspnea. Dermatological: No rash, lesions/masses Respiratory: No cough, dyspnea Urologic: No hematuria, dysuria Abdominal:   No nausea, vomiting, diarrhea, bright red blood per rectum, melena, or hematemesis Neurologic:  No visual changes, wkns, changes in mental status. All other systems reviewed and are otherwise negative except as noted above.  Physical Exam Blood pressure 110/60, heart rate 63 General: Pleasant, NAD Psych: Normal affect. Neuro: Alert and oriented X 3. Moves all extremities spontaneously. HEENT: Normal  Neck: Supple without bruits or JVD. Lungs:  Resp regular and unlabored, minimal crackles at both basis. Heart: iRRR no s3, s4, or murmurs. Abdomen: Soft, non-tender, non-distended, BS + x 4.  Extremities: No clubbing, cyanosis, mils B/L edema. DP/PT/Radials 2+ and equal bilaterally.  Labs:  No results for input(s): CKTOTAL, CKMB, TROPONINI in the last 72 hours. Lab Results  Component Value Date   WBC 6.9 12/27/2013   HGB  14.2 12/27/2013   HCT 41.4 12/27/2013   MCV 89.0 12/27/2013   PLT 204 12/27/2013    Lab Results  Component Value Date   DDIMER  09/21/2006    0.42        AT THE INHOUSE ESTABLISHED CUTOFF VALUE OF 0.48 ug/mL FEU, THIS ASSAY HAS BEEN DOCUMENTED IN THE LITERATURE TO HAVE   Invalid input(s): POCBNP    Component Value Date/Time   NA 138 05/28/2015 0754   K 4.1 05/28/2015 0754   CL 102 05/28/2015 0754   CO2 27 05/28/2015 0754   GLUCOSE 90 05/28/2015 0754   BUN 20 05/28/2015 0754   CREATININE 0.96 05/28/2015 0754   CREATININE 1.00 03/28/2014 0919   CALCIUM 8.9 05/28/2015 0754   PROT 6.8 05/28/2015 0754  ALBUMIN 3.9 05/28/2015 0754   AST 13 05/28/2015 0754   ALT 19 05/28/2015 0754   ALKPHOS 68 05/28/2015 0754   BILITOT 0.6 05/28/2015 0754   GFRNONAA 81* 12/29/2013 0936   GFRAA >90 12/29/2013 0936   Lab Results  Component Value Date   CHOL 140 05/28/2015   HDL 44 05/28/2015   LDLCALC 77 05/28/2015   TRIG 95 05/28/2015    Accessory Clinical Findings  Echocardiogram - 12/28/2013 Left ventricle: The cavity size was normal. There was mild concentric hypertrophy. Systolic function was normal. The estimated ejection fraction was in the range of 60% to 65%. Wall motion was normal; there were no regional wall motion abnormalities. Features are consistent with a pseudonormal left ventricular filling pattern, with concomitant abnormal relaxation and increased filling pressure (grade 2 diastolic dysfunction). There was no evidence of elevated ventricular filling pressure by Doppler parameters. - Aortic valve: There was no regurgitation. - Aortic root: The aortic root was normal in size. - Mitral valve: There was mild regurgitation. - Left atrium: The atrium was mildly dilated. - Right ventricle: Systolic function was normal. - Right atrium: The atrium was normal in size. - Tricuspid valve: There was mild regurgitation. - Pulmonary arteries: Systolic  pressure was within the normal range. - Inferior vena cava: The vessel was normal in size. - Pericardium, extracardiac: There was no pericardial effusion.  Impressions:  - Normal biventricular size and systolic function. Pseudonormal pattern of diastolic dysfunction with normal filling pressures. No significant valvular abnormality.  ECG - Atrial fibrillation with rate 65 BPM< nonspecific ST-T wave abnormalities., previously sinus bradycardia, first-degree AV block, otherwise normal.  Doppler; 1-39% ICA stenosis. Vertebral artery flow is antegrade.     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 Paroxysmal atrial fibrillation - the patient is already on Eliquis and rate controlled, However with grade 2 diastolic dysfunction he's not tolerating atrial fibrillation very well. He has never had a cardioversion performed, we will give it a try and reevaluate him in few weeks to see if symptoms have improved. Also if he is able to hold sinus rhythm for at least few weeks we will refer him again to the A. fib clinic for possible ablation.  2.  Acute on chronic diastolic CHF - possibly sec to atrial fibrillation,  he appears euvolemic we'll continue the same dose of Lasix.   3. H/o Paroxysmal atrial flutter - variable block.  Rate controlled on Cardizem SR 60 mg PO BID (borderline BP with recent stroke).  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 Lovena Le for apical consultation for possible ablation that didn't find him an appropriate candidate for ablation at this time as he goes between flutter and fibrillation. We will reconsider if he's able to hold sinus rhythm after cardioversion. Continue Eliquis.  4. Hypertension -  Well-controlled.  Follow up in  3 -4 weeks.   Ena Dawley, MD, [facility] 09/04/2015, 8:48 AM

## 2015-09-09 NOTE — Anesthesia Preprocedure Evaluation (Addendum)
Anesthesia Evaluation  Patient identified by MRN, date of birth, ID band Patient awake    Reviewed: Allergy & Precautions, NPO status , Patient's Chart, lab work & pertinent test results, reviewed documented beta blocker date and time   History of Anesthesia Complications Negative for: history of anesthetic complications  Airway Mallampati: II  TM Distance: >3 FB Neck ROM: Full    Dental   Pulmonary shortness of breath, former smoker,    Pulmonary exam normal        Cardiovascular hypertension, Pt. on medications + Peripheral Vascular Disease and +CHF  Normal cardiovascular exam     Neuro/Psych CVA, No Residual Symptoms negative psych ROS   GI/Hepatic GERD  ,  Endo/Other  negative endocrine ROS  Renal/GU Renal disease     Musculoskeletal negative musculoskeletal ROS (+)   Abdominal   Peds  Hematology negative hematology ROS (+)   Anesthesia Other Findings   Reproductive/Obstetrics negative OB ROS                            Anesthesia Physical Anesthesia Plan  ASA: III  Anesthesia Plan: General   Post-op Pain Management:    Induction: Intravenous  Airway Management Planned: Mask  Additional Equipment:   Intra-op Plan:   Post-operative Plan:   Informed Consent: I have reviewed the patients History and Physical, chart, labs and discussed the procedure including the risks, benefits and alternatives for the proposed anesthesia with the patient or authorized representative who has indicated his/her understanding and acceptance.     Plan Discussed with: CRNA and Surgeon  Anesthesia Plan Comments:         Anesthesia Quick Evaluation

## 2015-09-09 NOTE — Transfer of Care (Signed)
Immediate Anesthesia Transfer of Care Note  Patient: Austin Taylor  Procedure(s) Performed: Procedure(s): CARDIOVERSION (N/A)  Patient Location: Endoscopy Unit  Anesthesia Type:General  Level of Consciousness: awake, alert , oriented and patient cooperative  Airway & Oxygen Therapy: Patient Spontanous Breathing and Patient connected to nasal cannula oxygen  Post-op Assessment: Report given to RN and Post -op Vital signs reviewed and stable  Post vital signs: Reviewed and stable  Last Vitals:  Filed Vitals:   09/09/15 0652  BP: 143/84  Pulse: 75  Temp: 36.5 C  Resp: 20    Last Pain: There were no vitals filed for this visit.       Complications: No apparent anesthesia complications

## 2015-09-10 ENCOUNTER — Encounter (HOSPITAL_COMMUNITY): Payer: Self-pay | Admitting: Internal Medicine

## 2015-09-12 ENCOUNTER — Other Ambulatory Visit: Payer: Self-pay | Admitting: Cardiology

## 2015-09-17 ENCOUNTER — Telehealth: Payer: Self-pay | Admitting: Nurse Practitioner

## 2015-09-17 NOTE — Telephone Encounter (Signed)
Called and spoke to patient he will see Hoyle Sauer on the July 13 th . Patient was fine with this.

## 2015-09-26 ENCOUNTER — Ambulatory Visit (INDEPENDENT_AMBULATORY_CARE_PROVIDER_SITE_OTHER): Payer: Medicare Other | Admitting: Nurse Practitioner

## 2015-09-26 ENCOUNTER — Encounter: Payer: Self-pay | Admitting: Nurse Practitioner

## 2015-09-26 ENCOUNTER — Ambulatory Visit: Payer: Medicare Other | Admitting: Neurology

## 2015-09-26 VITALS — BP 105/72 | HR 68 | Ht 72.0 in | Wt 240.4 lb

## 2015-09-26 DIAGNOSIS — I6330 Cerebral infarction due to thrombosis of unspecified cerebral artery: Secondary | ICD-10-CM

## 2015-09-26 DIAGNOSIS — I63441 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: Secondary | ICD-10-CM

## 2015-09-26 DIAGNOSIS — I10 Essential (primary) hypertension: Secondary | ICD-10-CM | POA: Diagnosis not present

## 2015-09-26 DIAGNOSIS — E785 Hyperlipidemia, unspecified: Secondary | ICD-10-CM | POA: Diagnosis not present

## 2015-09-26 NOTE — Progress Notes (Signed)
I reviewed above note and agree with the assessment and plan.  Rosalin Hawking, MD PhD Stroke Neurology 09/26/2015 6:29 PM

## 2015-09-26 NOTE — Progress Notes (Signed)
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 XU28 yo M with PMH of HLD and hiatal hernia was admitted on 12/27/13 due to walking difficulty and unsteadiness. He is on ASA 81mg  daily prior to admission. CT negative and MRI showed 2 small infarct at right cerebellum. MRA and CUS negative. He was continued on ASA 325mg  and considered to change to plavix. However, he was found to have A. Flutter during hospitalization and cardiology consulted. He was put on cardiazem and eliquis and was discharged after stabilization. His neuro symptoms resolved fully.   01/30/14 follow up -Dr Erlinda Hong the patient has been doing well. No recurrent stroke symptoms. He has seen Dr. Meda Coffee as outpt for Aflutter and continued on cardiazem and eliquis. He was also referred to see Dr. Lovena Le for ablation evaluation. He takes eliquis and zocor without side effects.  Interval History7/13/16 Dr. Erlinda Hong During the interval time, pt has been doing well. Continued on eliquis. Followed up with cardiology for afib and aflutter and not candidate for ablation. No recurrent stroke and BP today 120/77. Will see PCP tomorrow. UPDATE 07/13/2017[name], 75 year old male returns for follow-up. He has history of 2 small infarcts right cerebellum in October 2015. He also has history of atrial fib flutter. He has not had recurrent stroke or TIA symptoms. He had cardioversion on 09/09/2015. He remains on eliquis and simvastatin secondary stroke prevention. Last carotid Doppler in 2015. Blood pressure in excellent control at 105/72 in the office He returns for reevaluation  REVIEW OF SYSTEMS: Full 14 system review of systems performed and notable only for those listed, all others are neg:  Constitutional: neg  Cardiovascular:  neg Ear/Nose/Throat: neg  Skin: neg Eyes: neg Respiratory: neg Gastroitestinal: neg  Hematology/Lymphatic: neg  Endocrine: neg Musculoskeletal:neg Allergy/Immunology: neg Neurological: neg Psychiatric: neg Sleep : neg   ALLERGIES: Allergies  Allergen Reactions  . Codeine     Nausea     HOME MEDICATIONS: Outpatient Prescriptions Prior to Visit  Medication Sig Dispense Refill  . apixaban (ELIQUIS) 5 MG TABS tablet Take 1 tablet (5 mg total) by mouth 2 (two) times daily. 60 tablet 11  . diltiazem (CARDIZEM) 60 MG tablet TAKE 1 TABLET BY MOUTH TWICE DAILY 60 tablet 11  . ferrous sulfate 325 (65 FE) MG tablet Take 325 mg by mouth as directed. 3 TIMES A WEEK    . furosemide (LASIX) 40 MG tablet Take 1 tablet (40 mg total) by mouth daily. 90 tablet 3  . sildenafil (REVATIO) 20 MG tablet Take 20 mg by mouth as directed.    . simvastatin (ZOCOR) 40 MG tablet Take 1 tablet (40 mg total) by mouth daily. 30 tablet 3  . diltiazem (CARDIZEM) 60 MG tablet Take 1 tablet (60 mg total) by mouth 2 (two) times daily. 60 tablet 3   No facility-administered medications prior to visit.    PAST MEDICAL HISTORY: Past Medical History  Diagnosis Date  . Hyperlipidemia     PAST SURGICAL HISTORY: Past Surgical History  Procedure Laterality Date  . Rotator cuff repair Left 1998  . Cardioversion N/A 09/09/2015    Procedure: CARDIOVERSION;  Surgeon: Fay Records, MD;  Location: [facility] ENDOSCOPY;  Service: Cardiovascular;  Laterality: N/A;    FAMILY HISTORY: Family History  Problem Relation Age of Onset  . Heart disease  Mother   . Heart disease Father   . Heart disease Brother   . Other Father     carotid artey stenosis    SOCIAL HISTORY: Social History   Social History  . Marital Status: Married    Spouse Name: N/A  . Number of Children: 2  . 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 Smoking status: Former  Smoker    Quit date: 08/02/1974  . Smokeless tobacco: Never Used  . Alcohol Use: Yes     Comment: occassional  . Drug Use: No  . Sexual Activity: Not on file   Other Topics Concern  . Not on file   Social History Narrative   Patient is married with 2 children.   Patient is right handed.   Patient has 14 yrs of education.   Patient does not drink caffeine.     PHYSICAL EXAM  Filed Vitals:   09/26/15 0815  BP: 105/72  Pulse: 68  Height: 6' (1.829 m)  Weight: 240 lb 6.4 oz (109.045 kg)   Body mass index is 32.6 kg/(m^2).  Generalized: Well developed, Obese male in no acute distress  Head: normocephalic and atraumatic,. Oropharynx benign  Neck: Supple, no carotid bruits  Cardiac: Regular rate rhythm, no murmur  Musculoskeletal: No deformity   Neurological examination   Mentation: Alert oriented to time, place, history taking. Attention span and concentration appropriate. Recent and remote memory intact.  Follows all commands speech and language fluent.   Cranial nerve II-XII: Fundoscopic deferred.Pupils were equal round reactive to light extraocular movements were full, visual field were full on confrontational test. Facial sensation and strength were normal. hearing was intact to finger rubbing bilaterally. Uvula tongue midline. head turning and shoulder shrug were normal and symmetric.Tongue protrusion into cheek strength was normal. Motor: normal bulk and tone, full strength in the BUE, BLE, fine finger movements normal, no pronator drift. No focal weakness Sensory: normal and symmetric to light touch, pinprick, and  Vibration, in the upper and lower extremities Coordination: finger-nose-finger, heel-to-shin bilaterally, no dysmetria Reflexes: Brachioradialis 2/2, biceps 2/2, triceps 2/2, patellar 2/2, Achilles 2/2, plantar responses were flexor bilaterally. Gait and Station: Rising up from seated position without assistance, normal stance,  moderate stride, good arm  swing, smooth turning, able to perform tiptoe, and heel walking without difficulty. Tandem gait is steady. No assistive device  DIAGNOSTIC DATA (LABS, IMAGING, TESTING) - I reviewed patient records, labs, notes, testing and imaging myself where available.      Component Value Date/Time   NA 141 09/04/2015 1013   K 4.2 09/04/2015 1013   CL 103 09/04/2015 1013   CO2 25 09/04/2015 1013   GLUCOSE 100* 09/04/2015 1013   BUN 17 09/04/2015 1013   CREATININE 1.06 09/04/2015 1013   CREATININE 1.00 03/28/2014 0919   CALCIUM 8.8 09/04/2015 1013   PROT 6.8 05/28/2015 0754   ALBUMIN 3.9 05/28/2015 0754   AST 13 05/28/2015 0754   ALT 19 05/28/2015 0754   ALKPHOS 68 05/28/2015 0754   BILITOT 0.6 05/28/2015 0754   GFRNONAA 81* 12/29/2013 0936   GFRAA >90 12/29/2013 0936   Lab Results  Component Value Date   CHOL 140 05/28/2015   HDL 44 05/28/2015   LDLCALC 77 05/28/2015   TRIG 95 05/28/2015   CHOLHDL 3.2 05/28/2015     ASSESSMENT AND PLAN  75 y.o. year old male  has a past  medical history of PMH of HLD and hiatal hernia was admitted 12/27/13 for two small right cerebellar stroke. Initially thought small vessel disease but he was found to have a flutter during admission, put on eliquis. He is on eliquis and simvastatin for stroke prevention now and neuro fully recovered. Follow up with cardiology for afib and aflutter, not candidate for ablation. Had cardioversion on 09/09/2015   PLAN: Keep systolic blood pressure less than 130, today's reading 105/72 Lipids are followed by PCP Continue Simvastatin last LDL 77 on 05/2015 Carotid Doppler to be scheduled in this office Continue Eliquis  for atrial fibrillation and secondary stroke prevention No further stroke or TIA symptoms since October 2015 Continue follow up with cardiology Walk for exercise If recurrent stroke symptoms occur, call 911 and proceed to the hospital Discharge from neurologic services at this timeVst time 25 min Dennie Bible, [facility], [facility], [facility] [address] [place], [place] [address] [phone]

## 2015-09-26 NOTE — Patient Instructions (Signed)
Keep systolic blood pressure less than 130, today's reading 105/72 Lipids are followed by PCP Continue Simvastatin Carotid Doppler to be scheduled Continue Eliquis  for atrial fibrillation and secondary stroke prevention No further stroke or TIA symptoms since October 2015 Continue follow up with cardiology Walk for exercise If recurrent stroke symptoms occur, call 911 and proceed to the hospital Discharge from neurologic services at this time

## 2015-09-30 ENCOUNTER — Ambulatory Visit (INDEPENDENT_AMBULATORY_CARE_PROVIDER_SITE_OTHER): Payer: Medicare Other | Admitting: Cardiology

## 2015-09-30 ENCOUNTER — Encounter: Payer: Self-pay | Admitting: Cardiology

## 2015-09-30 VITALS — BP 120/62 | HR 54 | Ht 72.0 in | Wt 242.4 lb

## 2015-09-30 DIAGNOSIS — I5033 Acute on chronic diastolic (congestive) heart failure: Secondary | ICD-10-CM

## 2015-09-30 DIAGNOSIS — E785 Hyperlipidemia, unspecified: Secondary | ICD-10-CM

## 2015-09-30 DIAGNOSIS — I110 Hypertensive heart disease with heart failure: Secondary | ICD-10-CM

## 2015-09-30 DIAGNOSIS — I4819 Other persistent atrial fibrillation: Secondary | ICD-10-CM

## 2015-09-30 DIAGNOSIS — Z7901 Long term (current) use of anticoagulants: Secondary | ICD-10-CM

## 2015-09-30 DIAGNOSIS — I480 Paroxysmal atrial fibrillation: Secondary | ICD-10-CM | POA: Diagnosis not present

## 2015-09-30 DIAGNOSIS — I481 Persistent atrial fibrillation: Secondary | ICD-10-CM

## 2015-09-30 MED ORDER — FUROSEMIDE 40 MG PO TABS: 40.0000 mg | ORAL_TABLET | Freq: Every day | ORAL | Status: DC

## 2015-09-30 NOTE — Progress Notes (Signed)
Patient ID: RYLEE TOOLE, male   DOB: 05/30/1940, 75 y.o.   MRN: [id number]    Patient Name: Austin Taylor Date of Encounter: 09/30/2015  Primary Care Provider:  Vena Austria, MD Primary Cardiologist:  Ena Dawley  Problem List   Past Medical History  Diagnosis Date  . Hyperlipidemia    Past Surgical History  Procedure Laterality Date  . Rotator cuff repair Left 1998  . Cardioversion N/A 09/09/2015    Procedure: CARDIOVERSION;  Surgeon: Fay Records, MD;  Location: [facility] ENDOSCOPY;  Service: Cardiovascular;  Laterality: N/A;   Allergies  Allergies  Allergen Reactions  . Codeine     Nausea    Chief complain: DOE, LE edema  HPI  Austin Taylor is an 75 y.o. male h/o hyperlipidemia who presented on 10/14 with unsteady gait and slurred speech and was diagnosed with an acute cerebellar stroke.  He took all his medications that included an ASA 81 mg on 12/26/13. The patient went for a daily 2 mile walk with his brother and started to drive and felt he didn't have full control of his feet and still "not normal" and therefore decided to return home. He has no personal hx of MI, TIA, or stroke. He was a former smoker ~1ppd and quit in 1973 and only occasionally drinks EtOH. Pt has extensive family hx of father CABG and CAE in his 49s and mother with CHF. During this interview pt feels he is nearly back to his baseline.   This morning at 3 am he developed atrial flutter with variable block and ventricular rate 80-120 BPM. He denies palpitations but feels fatigue. His wife and son states that he seemed tired and short of breath on exertion in the last month. No LE edema, no orthopnea or PND. No chest pain.He was started on Eliquis and PO Cardizem.  The patient is coming for first post hospitalization visit. He has recovered completely from his stroke. He is very compliant with his medicines and denies any bleeding. No melena or bright red stools.he denies any palpitations  or syncope. No lower extremity edema, shortness of breath or chest pain. His only complaint is cough with whitish sputum and he feels that he might be related to postnasal drip.  06/03/2015 - the patient is coming after  2 months,  At the last visit he was found to be fluid overloaded and his Lasix was increased to 40 mg daily, today he is -9 pounds. He denies any lower extremity edema, orthopnea, paroxysmal nocturnal dyspnea, however he states that ever since stroke he stopped exercising, he used to walk 2 miles a day. Right now he is limited by leg pain and fatigue. He gets short of breath with moderate to severe exertion. He is able to accomplish housework with some breaks.  He denies any palpitations or syncope and denies any bleeding problems with Eliquis.  09/04/2015, the patient is coming after 3 months, he states that he feels okay while at rest but with minimal physical activity he gets overly tired and short of breath. He denies any orthopnea or paroxysmal nocturnal dyspnea. He has been taking higher dose of Lasix 40 mg daily however hasn't lost any weight. He continues to walk. He states that year ago when he was not in atrial fibrillation he felt like he had significantly more energy. He has no side effects from his medicines and no bleeding with Eliquis.  09/30/2015 - the patient underwent DC cardioversion on 09/10/2015 with successful cardioversion  to sinus rhythm and states that he felt significantly better with regards to having more energy and feeling less short of breath. He feels back to his baseline, compliant with his meds with no side effects no bleeding with Elliquis.  Home Medications  Prior to Admission medications   Medication Sig Start Date End Date Taking? Authorizing Provider  apixaban (ELIQUIS) 5 MG TABS tablet Take 1 tablet (5 mg total) by mouth 2 (two) times daily. 12/30/13  Yes Belkys A Regalado, MD  diltiazem (CARDIZEM SR) 60 MG 12 hr capsule Take 1 capsule (60 mg total)  by mouth every 12 (twelve) hours. 12/30/13  Yes Belkys A Regalado, MD  ferrous sulfate 325 (65 FE) MG tablet Take 325 mg by mouth daily with breakfast.   Yes Historical Provider, MD  furosemide (LASIX) 20 MG tablet Take 1 tablet (20 mg total) by mouth daily. 12/30/13  Yes Belkys A Regalado, MD  simvastatin (ZOCOR) 40 MG tablet Take 40 mg by mouth daily.   Yes Historical Provider, MD    Family History  Family History  Problem Relation Age of Onset  . Heart disease Mother   . Heart disease Father   . Heart disease Brother   . Other Father     carotid artey stenosis    Social History  Social History   Social History  . Marital Status: Married    Spouse Name: N/A  . Number of Children: 2  . 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 Smoking status: Former Smoker    Quit date: 08/02/1974  . Smokeless tobacco: Never Used  . Alcohol Use: Yes     Comment: occassional  . Drug Use: No  . Sexual Activity: Not on file   Other Topics Concern  . Not on file   Social History Narrative   Patient is married with 2 children.   Patient is right handed.   Patient has 14 yrs of education.   Patient does not drink caffeine.     Review of Systems, as per HPI, otherwise negative General:  No chills, fever, night sweats or weight changes.  Cardiovascular:  No chest pain, dyspnea on exertion, edema, orthopnea, palpitations, paroxysmal nocturnal dyspnea. Dermatological: No rash, lesions/masses Respiratory: No cough, dyspnea Urologic: No hematuria, dysuria Abdominal:   No nausea, vomiting, diarrhea, bright red blood per rectum, melena, or hematemesis Neurologic:  No visual changes, wkns, changes in mental status. All other systems reviewed and are otherwise negative except as noted above.  Physical Exam Blood pressure 110/60, heart rate 63 General: Pleasant, NAD Psych: Normal affect. Neuro: Alert and oriented X 3.  Moves all extremities spontaneously. HEENT: Normal  Neck: Supple without bruits or JVD. Lungs:  Resp regular and unlabored, minimal crackles at both basis. Heart: iRRR no s3, s4, or murmurs. Abdomen: Soft, non-tender, non-distended, BS + x 4.  Extremities: No clubbing, cyanosis, mils B/L edema. DP/PT/Radials 2+ and equal bilaterally.  Labs:  No results for input(s): CKTOTAL, CKMB, TROPONINI in the last 72 hours. Lab Results  Component Value Date   WBC 6.9 12/27/2013   HGB 14.2 12/27/2013   HCT 41.4 12/27/2013   MCV 89.0 12/27/2013   PLT 204 12/27/2013    Lab Results  Component Value Date   DDIMER  09/21/2006    0.42        AT THE INHOUSE ESTABLISHED CUTOFF VALUE OF 0.48 ug/mL FEU, THIS ASSAY  HAS BEEN DOCUMENTED IN THE LITERATURE TO HAVE   Invalid input(s): POCBNP    Component Value Date/Time   NA 141 09/04/2015 1013   K 4.2 09/04/2015 1013   CL 103 09/04/2015 1013   CO2 25 09/04/2015 1013   GLUCOSE 100* 09/04/2015 1013   BUN 17 09/04/2015 1013   CREATININE 1.06 09/04/2015 1013   CREATININE 1.00 03/28/2014 0919   CALCIUM 8.8 09/04/2015 1013   PROT 6.8 05/28/2015 0754   ALBUMIN 3.9 05/28/2015 0754   AST 13 05/28/2015 0754   ALT 19 05/28/2015 0754   ALKPHOS 68 05/28/2015 0754   BILITOT 0.6 05/28/2015 0754   GFRNONAA 81* 12/29/2013 0936   GFRAA >90 12/29/2013 0936   Lab Results  Component Value Date   CHOL 140 05/28/2015   HDL 44 05/28/2015   LDLCALC 77 05/28/2015   TRIG 95 05/28/2015    Accessory Clinical Findings  Echocardiogram - 12/28/2013 Left ventricle: The cavity size was normal. There was mild concentric hypertrophy. Systolic function was normal. The estimated ejection fraction was in the range of 60% to 65%. Wall motion was normal; there were no regional wall motion abnormalities. Features are consistent with a pseudonormal left ventricular filling pattern, with concomitant abnormal relaxation and increased filling pressure (grade 2  diastolic dysfunction). There was no evidence of elevated ventricular filling pressure by Doppler parameters. - Aortic valve: There was no regurgitation. - Aortic root: The aortic root was normal in size. - Mitral valve: There was mild regurgitation. - Left atrium: The atrium was mildly dilated. - Right ventricle: Systolic function was normal. - Right atrium: The atrium was normal in size. - Tricuspid valve: There was mild regurgitation. - Pulmonary arteries: Systolic pressure was within the normal range. - Inferior vena cava: The vessel was normal in size. - Pericardium, extracardiac: There was no pericardial effusion.  Impressions:  - Normal biventricular size and systolic function. Pseudonormal pattern of diastolic dysfunction with normal filling pressures. No significant valvular abnormality.  ECG - Atrial fibrillation with rate 65 BPM< nonspecific ST-T wave abnormalities., previously sinus bradycardia, first-degree AV block, otherwise normal.  Doppler; 1-39% ICA stenosis. Vertebral artery flow is antegrade.     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 Paroxysmal atrial fibrillation -  - Status post cardioversion on 09/10/2015, however today patient is back in atrial fibrillation with controlled ventricular rate of 64 bpm. - The question is if he should be referred for possible ablation, his LVEF is normal and his left atrium was only mildly dilated on most recent echocardiogram from October 2015. - He has been in A. fib on and off approximately for 18 months now. - He was previously evaluated by Dr. Lovena Le and told that he is not a good candidate for a flutter ablation since it is left-sided and he also has paroxysmal A. Fib. - He might prefer to be reconsider for ablation since he feels significantly better being in sinus rhythm. - I will discuss with Dr. Rayann Heman and schedule a consultation  if he finds is feasible.  2.  Acute on chronic diastolic CHF - possibly sec to atrial fibrillation,  he is now euvolemic we'll continue the same dose of Lasix.   3. H/o Paroxysmal atrial flutter - variable block.  Rate controlled on Cardizem SR 60 mg PO BID (borderline BP with recent stroke).  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  Lovena Le for apical consultation for possible ablation that didn't find him an appropriate candidate for ablation at this time as he goes between flutter and fibrillation. We will reconsider if he's able to hold sinus rhythm after cardioversion. Continue Eliquis.  4. Hypertension -  Well-controlled.  Follow up in  6 months.   Ena Dawley, MD, [facility] 09/30/2015, 9:40 AM

## 2015-09-30 NOTE — Patient Instructions (Addendum)
Medication Instructions:  Your physician recommends that you continue on your current medications as directed. Please refer to the Current Medication list given to you today.   Labwork: NONE  Testing/Procedures: NONE  Follow-Up: Your physician wants you to follow-up in: [place] will receive a reminder letter in the mail two months in advance. If you don't receive a letter, please call our office to schedule the follow-up appointment.  Any Other Special Instructions Will Be Listed Below (If Applicable).     If you need a refill on your cardiac medications before your next appointment, please call your pharmacy. \

## 2015-10-03 ENCOUNTER — Other Ambulatory Visit (INDEPENDENT_AMBULATORY_CARE_PROVIDER_SITE_OTHER): Payer: Medicare Other

## 2015-10-03 DIAGNOSIS — I63441 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: Secondary | ICD-10-CM

## 2015-10-07 ENCOUNTER — Telehealth: Payer: Self-pay | Admitting: Nurse Practitioner

## 2015-10-07 NOTE — Telephone Encounter (Signed)
Please let patient know carotid doppler is negative for significant stenosis bilaterally

## 2015-10-07 NOTE — Telephone Encounter (Signed)
Spoke to pt and relayed that carotid doppler study was negative for significant narrowing.  He verbalized understanding.

## 2015-10-09 ENCOUNTER — Ambulatory Visit (HOSPITAL_COMMUNITY)
Admission: RE | Admit: 2015-10-09 | Discharge: 2015-10-09 | Disposition: A | Discharge status: Home / Self Care | Payer: Medicare Other | Source: Ambulatory Visit | Attending: Nurse Practitioner | Admitting: Nurse Practitioner

## 2015-10-09 ENCOUNTER — Encounter (HOSPITAL_COMMUNITY): Payer: Self-pay | Admitting: Nurse Practitioner

## 2015-10-09 VITALS — BP 122/80 | HR 54 | Ht 72.0 in | Wt 243.8 lb

## 2015-10-09 DIAGNOSIS — Z8673 Personal history of transient ischemic attack (TIA), and cerebral infarction without residual deficits: Secondary | ICD-10-CM | POA: Diagnosis not present

## 2015-10-09 DIAGNOSIS — I481 Persistent atrial fibrillation: Secondary | ICD-10-CM | POA: Insufficient documentation

## 2015-10-09 DIAGNOSIS — I480 Paroxysmal atrial fibrillation: Secondary | ICD-10-CM | POA: Diagnosis present

## 2015-10-09 DIAGNOSIS — E785 Hyperlipidemia, unspecified: Secondary | ICD-10-CM | POA: Diagnosis not present

## 2015-10-09 DIAGNOSIS — Z7901 Long term (current) use of anticoagulants: Secondary | ICD-10-CM | POA: Diagnosis not present

## 2015-10-09 DIAGNOSIS — Z885 Allergy status to narcotic agent status: Secondary | ICD-10-CM | POA: Diagnosis not present

## 2015-10-09 DIAGNOSIS — I440 Atrioventricular block, first degree: Secondary | ICD-10-CM | POA: Diagnosis not present

## 2015-10-09 DIAGNOSIS — Z8249 Family history of ischemic heart disease and other diseases of the circulatory system: Secondary | ICD-10-CM | POA: Insufficient documentation

## 2015-10-09 DIAGNOSIS — Z87891 Personal history of nicotine dependence: Secondary | ICD-10-CM | POA: Insufficient documentation

## 2015-10-09 DIAGNOSIS — I4819 Other persistent atrial fibrillation: Secondary | ICD-10-CM

## 2015-10-09 DIAGNOSIS — Z79899 Other long term (current) drug therapy: Secondary | ICD-10-CM | POA: Diagnosis not present

## 2015-10-09 NOTE — Progress Notes (Addendum)
Patient ID: Austin Taylor, male   DOB: Mar 26, 1940, 75 y.o.   MRN: [id number]    Primary Care Physician: Vena Austria, MD Referring Physician: Dr. Jolinda Croak MELL Austin Taylor is a 75 y.o. male with a h/o PAF dating back to 2 years ago, found at the time of a CVA. He was found to be in afib in June with a check up with Dr. Meda Coffee. He reported racing of the heart that started at 3 am the same morning of visit. He had experienced shortness of breath and fatigue for the previous few weeks. He was scheduled for cardioversion and did convert but had ERAF. He states that for a few weeks, he felt "great" with much more energy.  He is here today to discuss antiarrythmic therapy to restore SR. EKG shows afib with slow v. rate at 54 bpm, LAFB. EKG's in SR reveal first degree AVB pr int 280 ms. Creat 1.06. Left atrium size is 41 mm.  Today, he denies symptoms of palpitations, chest pain, shortness of breath, orthopnea, PND, lower extremity edema, dizziness, presyncope, syncope, or neurologic sequela. C/o of fatigue. The patient is tolerating medications without difficulties and is otherwise without complaint today.   Past Medical History:  Diagnosis Date  . Hyperlipidemia    Past Surgical History:  Procedure Laterality Date  . CARDIOVERSION N/A 09/09/2015   Procedure: CARDIOVERSION;  Surgeon: Fay Records, MD;  Location: [facility] ENDOSCOPY;  Service: Cardiovascular;  Laterality: N/A;  . ROTATOR CUFF REPAIR Left 1998    Current Outpatient Prescriptions  Medication Sig Dispense Refill  . apixaban (ELIQUIS) 5 MG TABS tablet Take 1 tablet (5 mg total) by mouth 2 (two) times daily. 60 tablet 11  . diltiazem (CARDIZEM) 60 MG tablet TAKE 1 TABLET BY MOUTH TWICE DAILY 60 tablet 11  . ferrous sulfate 325 (65 FE) MG tablet Take 325 mg by mouth as directed. 3 TIMES A WEEK    . furosemide (LASIX) 40 MG tablet Take 1 tablet (40 mg total) by mouth daily. 90 tablet 3  . sildenafil (REVATIO) 20 MG tablet Take 20  mg by mouth as directed.    . simvastatin (ZOCOR) 40 MG tablet Take 1 tablet (40 mg total) by mouth daily. 30 tablet 3   No current facility-administered medications for this encounter.     Allergies  Allergen Reactions  . Codeine     Nausea     Social History   Social History  . Marital status: Married    Spouse name: N/A  . Number of children: 2  . Years of education: 14   Occupational History  . Firefighter Other    Retired   Social History Main Topics  . Smoking status: Former Smoker    Quit date: 08/02/1974  . Smokeless tobacco: Never Used  . Alcohol use Yes     Comment: occassional  . Drug use: No  . Sexual activity: Not on file   Other Topics Concern  . Not on file   Social History Narrative   Patient is married with 2 children.   Patient is right handed.   Patient has 14 yrs of education.   Patient does not drink caffeine.    Family History  Problem Relation Age of Onset  . Heart disease Mother   . Heart disease Father   . Heart disease Brother   . Other Father     carotid artey stenosis    ROS- All systems are reviewed and negative except  as per the HPI above  Physical Exam: Vitals:   10/09/15 0850  BP: 122/80  Pulse: (!) 54  Weight: 243 lb 12.8 oz (110.6 kg)  Height: 6' (1.829 m)    GEN- The patient is well appearing, alert and oriented x 3 today.   Head- normocephalic, atraumatic Eyes-  Sclera clear, conjunctiva pink Ears- hearing intact Oropharynx- clear Neck- supple, no JVP Lymph- no cervical lymphadenopathy Lungs- Clear to ausculation bilaterally, normal work of breathing Heart-Slow, irregular rate and rhythm, no murmurs, rubs or gallops, PMI not laterally displaced GI- soft, NT, ND, + BS Extremities- no clubbing, cyanosis, or edema MS- no significant deformity or atrophy Skin- no rash or lesion Psych- euthymic mood, full affect Neuro- strength and sensation are intact  EKG-afib with slow v rate, qrs int 86 ms, qtc 411 ms,  LAFB,  Echo-10/15 - Left ventricle: The cavity size was normal. There was mild concentric hypertrophy. Systolic function was normal. The estimated ejection fraction was in the range of 60% to 65%. Wall motion was normal; there were no regional wall motion abnormalities. Features are consistent with a pseudonormal left ventricular filling pattern, with concomitant abnormal relaxation and increased filling pressure (grade 2 diastolic dysfunction). There was no evidence of elevated ventricular filling pressure by Doppler parameters. - Aortic valve: There was no regurgitation. - Aortic root: The aortic root was normal in size. - Mitral valve: There was mild regurgitation. - Left atrium: The atrium was mildly dilated. - Right ventricle: Systolic function was normal. - Right atrium: The atrium was normal in size. - Tricuspid valve: There was mild regurgitation. - Pulmonary arteries: Systolic pressure was within the normal range. - Inferior vena cava: The vessel was normal in size. - Pericardium, extracardiac: There was no pericardial effusion.  Impressions:  - Normal biventricular size and systolic function. Pseudonormal pattern of diastolic dysfunction with normal filling pressures. No significant valvular abnormality.  ASSESSMENT/PLAN  1. Persistent  Symptomatic afib with slow ventricular response Discussed antiarrythmic's with pt and wife Will have to review with Dr. Rayann Heman, but I think first degree AVB  in SR, may make flecainide not the best choice for AAD. Amiodarone and tikosyn also discussed with pt and the wife wants to check with insurance company to see what cost of tikosyn would be She will do that and I will discuss with Dr. Rayann Heman and inform pt/wife of plan Continue apixaban 5 mg bid for chadsvasc score of 3 Continue on cardizem 60 mg bid  Butch Penny C. Mila Homer Afib [facility] [address] [place], [place]  [address] [phone]   7/28- Discussed with Dr. Rayann Heman and he agreed that flecainide not best choice of AAD due to first degree AVB. Also due to slow conduction of afib today with SVR, he believes that Tikosyn may be his best option. I discussed with pt and he wishes to proceed with tikosyn.

## 2015-10-15 NOTE — Addendum Note (Signed)
Encounter addended by: Sherran Needs, NP on: 10/15/2015 10:05 AM<BR>    Actions taken: Sign clinical note

## 2015-11-04 ENCOUNTER — Ambulatory Visit: Payer: Medicare Other | Admitting: Pharmacist

## 2015-11-05 ENCOUNTER — Ambulatory Visit (INDEPENDENT_AMBULATORY_CARE_PROVIDER_SITE_OTHER): Payer: Medicare Other | Admitting: Pharmacist

## 2015-11-05 ENCOUNTER — Inpatient Hospital Stay (HOSPITAL_COMMUNITY)
Admission: AD | Admit: 2015-11-05 | Discharge: 2015-11-08 | DRG: 310 | Disposition: A | Discharge status: Home / Self Care | Payer: Medicare Other | Source: Ambulatory Visit | Attending: Internal Medicine | Admitting: Internal Medicine

## 2015-11-05 ENCOUNTER — Encounter (HOSPITAL_COMMUNITY): Payer: Self-pay | Admitting: Internal Medicine

## 2015-11-05 ENCOUNTER — Encounter (INDEPENDENT_AMBULATORY_CARE_PROVIDER_SITE_OTHER): Payer: Self-pay

## 2015-11-05 ENCOUNTER — Ambulatory Visit: Payer: Medicare Other | Admitting: Pharmacist

## 2015-11-05 DIAGNOSIS — I340 Nonrheumatic mitral (valve) insufficiency: Secondary | ICD-10-CM

## 2015-11-05 DIAGNOSIS — Z87891 Personal history of nicotine dependence: Secondary | ICD-10-CM

## 2015-11-05 DIAGNOSIS — I4819 Other persistent atrial fibrillation: Secondary | ICD-10-CM

## 2015-11-05 DIAGNOSIS — I441 Atrioventricular block, second degree: Secondary | ICD-10-CM | POA: Diagnosis present

## 2015-11-05 DIAGNOSIS — I4581 Long QT syndrome: Secondary | ICD-10-CM | POA: Diagnosis present

## 2015-11-05 DIAGNOSIS — Z8673 Personal history of transient ischemic attack (TIA), and cerebral infarction without residual deficits: Secondary | ICD-10-CM

## 2015-11-05 DIAGNOSIS — Z7901 Long term (current) use of anticoagulants: Secondary | ICD-10-CM | POA: Diagnosis not present

## 2015-11-05 DIAGNOSIS — I4891 Unspecified atrial fibrillation: Secondary | ICD-10-CM | POA: Diagnosis not present

## 2015-11-05 DIAGNOSIS — Z79899 Other long term (current) drug therapy: Secondary | ICD-10-CM

## 2015-11-05 DIAGNOSIS — E785 Hyperlipidemia, unspecified: Secondary | ICD-10-CM | POA: Diagnosis present

## 2015-11-05 DIAGNOSIS — I481 Persistent atrial fibrillation: Principal | ICD-10-CM

## 2015-11-05 HISTORY — DX: Chronic kidney disease, unspecified: N18.9

## 2015-11-05 HISTORY — DX: Cerebral infarction, unspecified: I63.9

## 2015-11-05 HISTORY — DX: Heart failure, unspecified: I50.9

## 2015-11-05 HISTORY — DX: Other persistent atrial fibrillation: I48.19

## 2015-11-05 LAB — BASIC METABOLIC PANEL
Anion gap: 5 (ref 5–15)
BUN: 13 mg/dL (ref 6–20)
CO2: 27 mmol/L (ref 22–32)
Calcium: 8.7 mg/dL — ABNORMAL LOW (ref 8.9–10.3)
Chloride: 107 mmol/L (ref 101–111)
Creatinine, Ser: 1.09 mg/dL (ref 0.61–1.24)
GFR calc Af Amer: >60 mL/min (ref >60)
GFR calc non Af Amer: >60 mL/min (ref >60)
Glucose, Bld: 108 mg/dL — ABNORMAL HIGH (ref 65–99)
Potassium: 3.8 mmol/L (ref 3.5–5.1)
Sodium: 139 mmol/L (ref 135–145)

## 2015-11-05 LAB — MAGNESIUM: Magnesium: 2.1 mg/dL (ref 1.7–2.4)

## 2015-11-05 MED ORDER — DOFETILIDE 250 MCG PO CAPS
500.0000 ug | ORAL_CAPSULE | Freq: Two times a day (BID) | ORAL | Status: DC
  Administered 2015-11-05 – 2015-11-06 (×3): 500 ug via ORAL
  Filled 2015-11-05 (×3): qty 2

## 2015-11-05 MED ORDER — SODIUM CHLORIDE 0.9% FLUSH
3.0000 mL | Freq: Two times a day (BID) | INTRAVENOUS | Status: DC
  Administered 2015-11-05 – 2015-11-08 (×6): 3 mL via INTRAVENOUS

## 2015-11-05 MED ORDER — APIXABAN 5 MG PO TABS
5.0000 mg | ORAL_TABLET | Freq: Two times a day (BID) | ORAL | Status: DC
  Administered 2015-11-05 – 2015-11-08 (×6): 5 mg via ORAL
  Filled 2015-11-05 (×6): qty 1

## 2015-11-05 MED ORDER — FUROSEMIDE 40 MG PO TABS
40.0000 mg | ORAL_TABLET | Freq: Every day | ORAL | Status: DC
  Administered 2015-11-06 – 2015-11-08 (×3): 40 mg via ORAL
  Filled 2015-11-05 (×3): qty 1

## 2015-11-05 MED ORDER — SODIUM CHLORIDE 0.9 % IV SOLN: 250.0000 mL | INTRAVENOUS | Status: DC | PRN

## 2015-11-05 MED ORDER — ONDANSETRON HCL 4 MG/2ML IJ SOLN: 4.0000 mg | Freq: Once | INTRAMUSCULAR | Status: DC | PRN

## 2015-11-05 MED ORDER — HYDROMORPHONE HCL 1 MG/ML IJ SOLN: 0.2500 mg | INTRAMUSCULAR | Status: DC | PRN

## 2015-11-05 MED ORDER — POTASSIUM CHLORIDE CRYS ER 20 MEQ PO TBCR
20.0000 meq | EXTENDED_RELEASE_TABLET | Freq: Once | ORAL | Status: AC
  Administered 2015-11-05: 20 meq via ORAL
  Filled 2015-11-05: qty 1

## 2015-11-05 MED ORDER — SIMVASTATIN 40 MG PO TABS
40.0000 mg | ORAL_TABLET | Freq: Every day | ORAL | Status: DC
  Administered 2015-11-05 – 2015-11-07 (×3): 40 mg via ORAL
  Filled 2015-11-05 (×3): qty 1

## 2015-11-05 MED ORDER — MEPERIDINE HCL 25 MG/ML IJ SOLN: 6.2500 mg | INTRAMUSCULAR | Status: DC | PRN

## 2015-11-05 MED ORDER — SODIUM CHLORIDE 0.9% FLUSH: 3.0000 mL | INTRAVENOUS | Status: DC | PRN

## 2015-11-05 NOTE — Progress Notes (Signed)
PT's QTC 411ms per EKG from 11/05/2015 1816. EKG in pt's chart. Did not transfer over to results in computer.   Sanya Kobrin, RN

## 2015-11-05 NOTE — H&P (Addendum)
Electrophysiology Office Note   Date:  11/05/2015   ID:  Austin Taylor, DOB Feb 26, 1941, MRN [id number]  PCP:  Vena Austria, MD  Cardiologist:  Dr Meda Coffee Primary Electrophysiologist: [name], MD    CC: AF   History of Present Illness: Austin Taylor is a 75 y.o. male who presents today for electrophysiology evaluation.   The patient has initially diagnosed with afib in the setting stroke 2 years ago.  He did very well until June of this year when he had afib recurrence.  He underwent cardioversion and felt great.  Unfortunately, he quickly returned to afib.  He reports fatigue and decreased exercise tolerance in afib.  He also has palpitations.   Today, he denies symptoms o chest pain, shortness of breath, orthopnea, PND, lower extremity edema, claudication, dizziness, presyncope, syncope, bleeding, or neurologic sequela. The patient is tolerating medications without difficulties and is otherwise without complaint today.    Past Medical History:  Diagnosis Date  . Hyperlipidemia   . Persistent atrial fibrillation ([place])   . Stroke (cerebrum) [facility]    Past Surgical History:  Procedure Laterality Date  . CARDIOVERSION N/A 09/09/2015   Procedure: CARDIOVERSION;  Surgeon: Fay Records, MD;  Location: [place];  Service: Cardiovascular;  Laterality: N/A;  . ROTATOR CUFF REPAIR Left 1998    Home meds reviewed  Allergies:   Codeine   Social History:  The patient  reports that he quit smoking about 41 years ago. He has never used smokeless tobacco. He reports that he drinks alcohol. He reports that he does not use drugs.   Family History:  The patient's  family history includes Heart disease in his brother, father, and mother; Other in his father.    ROS:  Please see the history of present illness.   All other systems are reviewed and negative.    PHYSICAL EXAM: VS:  BP 130/76   Pulse (!) 54   Temp 97.9 F (36.6 C) (Oral)   Ht 6' (1.829 m)   Wt 240 lb  11.2 oz (109.2 kg)   SpO2 97%   BMI 32.64 kg/m  , BMI Body mass index is 32.64 kg/m. GEN: Well nourished, well developed, in no acute distress  HEENT: normal  Neck: no JVD, carotid bruits, or masses Cardiac: iRRR; no murmurs, rubs, or gallops,no edema  Respiratory:  clear to auscultation bilaterally, normal work of breathing GI: soft, nontender, nondistended, + BS MS: no deformity or atrophy  Skin: warm and dry  Neuro:  Strength and sensation are intact Psych: euthymic mood, full affect  EKG:  ekg 10/09/2015 reveals afib 54 bpm, Qtc 411 msec   Recent Labs: 05/28/2015: ALT 19 11/05/2015: BUN 13; Creatinine, Ser 1.09; Magnesium 2.1; Potassium 3.8; Sodium 139    Lipid Panel     Component Value Date/Time   CHOL 140 05/28/2015 0754   TRIG 95 05/28/2015 0754   HDL 44 05/28/2015 0754   CHOLHDL 3.2 05/28/2015 0754   VLDL 19 05/28/2015 0754   LDLCALC 77 05/28/2015 0754     Wt Readings from Last 3 Encounters:  11/05/15 240 lb 11.2 oz (109.2 kg)  10/09/15 243 lb 12.8 oz (110.6 kg)  09/30/15 242 lb 6.4 oz (110 kg)      Other studies Reviewed: Additional studies/ records that were reviewed today include: AF clinic notes reviewed prior echo reviewed  Review of the above records today demonstrates: preserved EF, mild MR, mild LA enlargement   ASSESSMENT AND PLAN:  1.  Persistent afib The patient has symptomatic persistent afib.  He has not tried AAD therapy.  Chads2vasc score is at least 4.  HE reports compliance with eliquis without interruption for > 3 weeks.  Therapeutic strategies for afib including rate and rhythm control were discussed in detail with the patient today. Risk, benefits, and alternatives to tikosyn were discussed.  He would like to start tikosyn at this time. Will therefore admit for initiation of tikosyn. If he does not convert to sinus, will require cardioversion on Thurs. Will update echo  2. Prior stroke Continue life long  anticoagulation    Signed, [name], MD  11/05/2015 5:11 PM     [place] [address] [place] [address] [phone] (office) [phone] (fax)

## 2015-11-05 NOTE — Progress Notes (Signed)
Patient ID: Austin Taylor, male   DOB: 1940/10/14, 75 y.o.   MRN: [id number]     Primary Care Physician: Austin Austria, MD Referring Physician: Dr. Jolinda Taylor Austin Taylor is a 75 y.o. male patient of Dr. Meda Taylor and Afib clinic who was referred to pharmacy clinic for Tikosyn initiation.  Pt has a h/o PAF dating back to 2 years ago, found at the time of a CVA. He was found to be in afib in June with a check up with Dr. Meda Taylor. He reported racing of the heart that started at 3 am the same morning of visit. He had experienced shortness of breath and fatigue for the previous few weeks. He was scheduled for cardioversion and did convert but had ERAF. He states that for a few weeks, he felt "great" with much more energy.  He was seen on 7/26 in the afib clinic to discuss antiarrhthymic therapy.  Pt has 1st degree AVB so flecainide was not a good option.  Tikosyn was thought to be best option due to slow conduction of afib.  Pt and wife were agreeable.   Pt and wife are here today to discuss Tikosyn.  Discussed potential side effects including QTc prolongation.  He is aware of the importance of compliance and drug interactions and will call us with any concerns.  Medication list reviewed.  He has not tried any other AAD therapy.  He is anticoagulated with Eliquis and reports compliance for the past 30 days.  No other QTc prolongating or contraindicated medications noted.  He did check the price of Tikosyn.  It will be $95/month but affordable for patient.   EKG reviewed by Dr. Caryl Taylor-  Afib with vent rate of 60 bpm.  QTc 428msec.     Past Medical History:  Diagnosis Date  . Hyperlipidemia    Past Surgical History:  Procedure Laterality Date  . CARDIOVERSION N/A 09/09/2015   Procedure: CARDIOVERSION;  Surgeon: Fay Records, MD;  Location: [facility] ENDOSCOPY;  Service: Cardiovascular;  Laterality: N/A;  . ROTATOR CUFF REPAIR Left 1998    Current Outpatient Prescriptions  Medication Sig  Dispense Refill  . apixaban (ELIQUIS) 5 MG TABS tablet Take 1 tablet (5 mg total) by mouth 2 (two) times daily. 60 tablet 11  . diltiazem (CARDIZEM) 60 MG tablet TAKE 1 TABLET BY MOUTH TWICE DAILY 60 tablet 11  . ferrous sulfate 325 (65 FE) MG tablet Take 325 mg by mouth as directed. 3 TIMES A WEEK    . furosemide (LASIX) 40 MG tablet Take 1 tablet (40 mg total) by mouth daily. 90 tablet 3  . sildenafil (REVATIO) 20 MG tablet Take 20 mg by mouth as directed.    . simvastatin (ZOCOR) 40 MG tablet Take 1 tablet (40 mg total) by mouth daily. 30 tablet 3   No current facility-administered medications for this visit.     Allergies  Allergen Reactions  . Codeine     Nausea     ASSESSMENT/PLAN  1. Persistent  Symptomatic afib with slow ventricular response- Reviewed pt's EKG with Dr. Caryl Taylor.  QTc slightly prolonged today but shorter on previous EKGs. Labs reviewed.  Mg at 2.1, K at 3.8.  Discussed options with patient.  He is scheduled to go out of town next week so would prefer to do potassium supplementation in hospital rather than at home.  Will go ahead and try to get a room as soon as possible this afternoon so he can start therapy and  get first dose in tonight if potassium increases.  SCr- 1.09, CrCl- 89 mL/min.  Okay to start 539mcg once electrolytes appropriate.

## 2015-11-05 NOTE — Progress Notes (Signed)
New EKG in pt's chart. QTC 439ms  Bashir Marchetti, RN

## 2015-11-06 ENCOUNTER — Inpatient Hospital Stay (HOSPITAL_COMMUNITY): Payer: Medicare Other

## 2015-11-06 DIAGNOSIS — I4891 Unspecified atrial fibrillation: Secondary | ICD-10-CM

## 2015-11-06 LAB — ECHOCARDIOGRAM COMPLETE
Height: 72 in
WEIGHTICAEL: 3851.2 [oz_av]

## 2015-11-06 LAB — BASIC METABOLIC PANEL
Anion gap: 6 (ref 5–15)
BUN: 16 mg/dL (ref 6–20)
CALCIUM: 8.4 mg/dL — AB (ref 8.9–10.3)
CO2: 24 mmol/L (ref 22–32)
CREATININE: 1.1 mg/dL (ref 0.61–1.24)
Chloride: 109 mmol/L (ref 101–111)
GFR calc Af Amer: >60 mL/min (ref >60)
GLUCOSE: 94 mg/dL (ref 65–99)
POTASSIUM: 4 mmol/L (ref 3.5–5.1)
SODIUM: 139 mmol/L (ref 135–145)

## 2015-11-06 LAB — MAGNESIUM: MAGNESIUM: 2.2 mg/dL (ref 1.7–2.4)

## 2015-11-06 NOTE — Care Management Note (Signed)
Case Management Note Marvetta Gibbons RN, BSN Unit 2W-Case Manager [phone]  Patient Details  Name: Austin Taylor MRN: [id number] Date of Birth: 02-27-1941  Subjective/Objective:   Pt admitted with afib- for Tikosyn load                 Action/Plan: PTA pt lived at home- anticipate return home- referral for Tikosyn assistance- per insurance check- S/W CIRRA @ [place] # [phone]   TIKOSYN 500 MCG BID  NONE FORMULARY  PRIOR APPROVAL - YES # [phone]   DOFETILIDE  500 MCG BID  (30 )   COVER- YES  CO-PAY- $ 95.00  TIER- 4 DRUG  PRIOR APPROVAL - NO  PHARMACY : [facility]   MAIL-ORDER FOR 90 DAY SUPPLY $ 275.0   Spoke with pt and wife at bedside- insurance coverage shared- per conversation they state that they will be leaving out of town on 8/28-returning on 9/2- unless script for Tikosyn can be sent to pharmacy early prior to discharge for wife to be able to pick up on Saturday prior to them leaving pt would need a 14 day supply to get him through until he returns and is able to pick up medication from his pharmacy following his trip. Call made to [place] who actually has generic drug in stock to fill if script received. - CM to continue to follow.- Pt will need 14 day supply from [facility] upon discharge.   Expected Discharge Date:                  Expected Discharge Plan:  Home/Self Care  In-House Referral:     Discharge planning Services  CM Consult, Medication Assistance  Post Acute Care Choice:    Choice offered to:     DME Arranged:    DME Agency:     HH Arranged:    HH Agency:     Status of Service:  Completed, signed off  If discussed at [employer] of Stay Meetings, dates discussed:    Additional Comments:  Dawayne Patricia, RN 11/06/2015, 10:26 AM

## 2015-11-06 NOTE — Progress Notes (Signed)
Per Clinical biochemist for [employer] @ [employer] # [phone]   TIKOSYN 500 MCG BID  NONE FORMULARY  PRIOR APPROVAL - YES # [phone]   DOFETILIDE  500 MCG BID  (30 )   COVER- YES  CO-PAY- $ 95.00  TIER- 4 DRUG  PRIOR APPROVAL - NO  PHARMACY : [facility]   MAIL-ORDER FOR 90 DAY SUPPLY $ 275.0

## 2015-11-06 NOTE — Progress Notes (Signed)
Echocardiogram 2D Echocardiogram has been performed.  Aggie Cosier 11/06/2015, 9:49 AM

## 2015-11-06 NOTE — Progress Notes (Signed)
    SUBJECTIVE: The patient is doing well today.  At this time, he denies chest pain, shortness of breath, or any new concerns.  Marland Kitchen apixaban  5 mg Oral BID  . dofetilide  500 mcg Oral BID  . furosemide  40 mg Oral Daily  . simvastatin  40 mg Oral q1800  . sodium chloride flush  3 mL Intravenous Q12H      OBJECTIVE: Physical Exam: Vitals:   11/05/15 1600 11/05/15 2021 11/06/15 0617  BP: 130/76 127/79 136/79  Pulse: (!) 54 72 74  Resp:  18 16  Temp: 97.9 F (36.6 C) 98 F (36.7 C) 97.5 F (36.4 C)  TempSrc: Oral Oral Oral  SpO2: 97% 95% 96%  Weight: 240 lb 11.2 oz (109.2 kg)    Height: 6' (1.829 m)     No intake or output data in the 24 hours ending 11/06/15 0955  Telemetry reveals SR, 1st degree AVblock, intermittent mobitz one, 70's-80's, no bradycardia  GEN- The patient is well appearing, alert and oriented x 3 today.   Head- normocephalic, atraumatic Eyes-  Sclera clear, conjunctiva pink Ears- hearing intact Oropharynx- clear Neck- supple, no JVP Lungs- Clear to ausculation bilaterally, normal work of breathing Heart- RRR, no significant murmurs, no rubs or gallops GI- soft, NT, ND Extremities- no clubbing, cyanosis, or edema Skin- no rash or lesion Psych- euthymic mood, full affect Neuro- no gross deficits appreciated  LABS: Basic Metabolic Panel:  Recent Labs  11/05/15 0945 11/06/15 0304  NA 139 139  K 3.8 4.0  CL 107 109  CO2 27 24  GLUCOSE 108* 94  BUN 13 16  CREATININE 1.09 1.10  CALCIUM 8.7* 8.4*  MG 2.1 2.2   ASSESSMENT AND PLAN:  1.  Persistent afib The patient has symptomatic persistent afib.  Chads2vasc score is at least 4.   He reported compliance with eliquis without interruption for > 3 weeks.   Tikosyn initiation K+ 4.0 Mag 2.2 Creat  1.10 (calc Cr. Cl.  88) QTc reviewed by dr. Rayann Heman, stable to continue 565mcg BID  2. Prior stroke Continue life long anticoagulation  Tommye Standard, PA-C 11/06/2015 9:55 AM  I have seen,  examined the patient, and reviewed the above assessment and plan.  On exam, RRR. Changes to above are made where necessary.   Converted to sinus.  Will follow mobitz I second degree AV block for symptoms.  Co Sign: [name], MD 11/06/2015

## 2015-11-07 LAB — BASIC METABOLIC PANEL
Anion gap: 5 (ref 5–15)
BUN: 12 mg/dL (ref 6–20)
CALCIUM: 8.5 mg/dL — AB (ref 8.9–10.3)
CO2: 30 mmol/L (ref 22–32)
CREATININE: 1.08 mg/dL (ref 0.61–1.24)
Chloride: 104 mmol/L (ref 101–111)
GFR calc Af Amer: >60 mL/min (ref >60)
Glucose, Bld: 100 mg/dL — ABNORMAL HIGH (ref 65–99)
POTASSIUM: 3.8 mmol/L (ref 3.5–5.1)
SODIUM: 139 mmol/L (ref 135–145)

## 2015-11-07 LAB — MAGNESIUM: MAGNESIUM: 2.3 mg/dL (ref 1.7–2.4)

## 2015-11-07 MED ORDER — ONDANSETRON HCL 4 MG/2ML IJ SOLN
4.0000 mg | Freq: Four times a day (QID) | INTRAMUSCULAR | Status: DC | PRN
Start: 2015-11-07 — End: 2015-11-08

## 2015-11-07 MED ORDER — POTASSIUM CHLORIDE CRYS ER 20 MEQ PO TBCR
20.0000 meq | EXTENDED_RELEASE_TABLET | Freq: Once | ORAL | Status: AC
  Administered 2015-11-07: 20 meq via ORAL
  Filled 2015-11-07: qty 1

## 2015-11-07 MED ORDER — ACETAMINOPHEN 325 MG PO TABS
650.0000 mg | ORAL_TABLET | ORAL | Status: DC | PRN
  Administered 2015-11-07: 650 mg via ORAL
  Filled 2015-11-07: qty 2

## 2015-11-07 MED ORDER — DOFETILIDE 250 MCG PO CAPS
250.0000 ug | ORAL_CAPSULE | Freq: Two times a day (BID) | ORAL | Status: DC
  Administered 2015-11-07 – 2015-11-08 (×3): 250 ug via ORAL
  Filled 2015-11-07 (×3): qty 1

## 2015-11-07 NOTE — Progress Notes (Signed)
    SUBJECTIVE: The patient is doing well today.  At this time, he denies chest pain, shortness of breath, or any new concerns.  Marland Kitchen apixaban  5 mg Oral BID  . dofetilide  250 mcg Oral BID  . furosemide  40 mg Oral Daily  . simvastatin  40 mg Oral q1800  . sodium chloride flush  3 mL Intravenous Q12H      OBJECTIVE: Physical Exam: Vitals:   11/06/15 0617 11/06/15 1336 11/06/15 2040 11/07/15 0415  BP: 136/79 120/69 140/70 122/66  Pulse: 74 66 68 64  Resp: 16 20 20 19   Temp: 97.5 F (36.4 C) 97.8 F (36.6 C) 97.7 F (36.5 C) 97.7 F (36.5 C)  TempSrc: Oral Oral Oral Oral  SpO2: 96% 98% 96% 94%  Weight:      Height:        Intake/Output Summary (Last 24 hours) at 11/07/15 0744 Last data filed at 11/06/15 1800  Gross per 24 hour  Intake              720 ml  Output                0 ml  Net              720 ml    Telemetry reveals SR, 1st degree AVblock, intermittent mobitz one, 70's-80's, no bradycardia  GEN- The patient is well appearing, alert and oriented x 3 today.   Head- normocephalic, atraumatic Eyes-  Sclera clear, conjunctiva pink Ears- hearing intact Oropharynx- clear Neck- supple, no JVP Lungs- Clear to ausculation bilaterally, normal work of breathing Heart- RRR, no significant murmurs, no rubs or gallops GI- soft, NT, ND Extremities- no clubbing, cyanosis, or edema Skin- no rash or lesion Psych- euthymic mood, full affect Neuro- no gross deficits appreciated  LABS: Basic Metabolic Panel:  Recent Labs  11/06/15 0304 11/07/15 0354  NA 139 139  K 4.0 3.8  CL 109 104  CO2 24 30  GLUCOSE 94 100*  BUN 16 12  CREATININE 1.10 1.08  CALCIUM 8.4* 8.5*  MG 2.2 2.3   ASSESSMENT AND PLAN:  1.  Persistent afib The patient has symptomatic persistent afib.  Chads2vasc score is at least 4.   Converted to SR with Tikosyn QTc long this morning, will decrease Tikosyn dose to 223mcg bid K 3.8, will supplement   2. Prior stroke Continue life long  anticoagulation  3.  Mobitz I Asymptomatic Will follow   Chanetta Marshall, NP 11/07/2015 7:45 AM  I have seen, examined the patient, and reviewed the above assessment and plan.  On exam, RRR. Changes to above are made where necessary.  QT is prolonged.  Will reduce tikosyn to 223mcg BID.  Co Sign: [name], MD 11/07/2015 8:12 AM

## 2015-11-08 ENCOUNTER — Other Ambulatory Visit: Payer: Self-pay

## 2015-11-08 LAB — BASIC METABOLIC PANEL
ANION GAP: 7 (ref 5–15)
BUN: 13 mg/dL (ref 6–20)
CO2: 29 mmol/L (ref 22–32)
Calcium: 8.8 mg/dL — ABNORMAL LOW (ref 8.9–10.3)
Chloride: 102 mmol/L (ref 101–111)
Creatinine, Ser: 1.07 mg/dL (ref 0.61–1.24)
GFR calc Af Amer: >60 mL/min (ref >60)
Glucose, Bld: 103 mg/dL — ABNORMAL HIGH (ref 65–99)
POTASSIUM: 4 mmol/L (ref 3.5–5.1)
SODIUM: 138 mmol/L (ref 135–145)

## 2015-11-08 LAB — MAGNESIUM: MAGNESIUM: 2.3 mg/dL (ref 1.7–2.4)

## 2015-11-08 MED ORDER — DOFETILIDE 250 MCG PO CAPS: 250.0000 ug | ORAL_CAPSULE | Freq: Two times a day (BID) | ORAL | 1 refills | Status: DC

## 2015-11-08 MED ORDER — OFF THE BEAT BOOK
Freq: Once | Status: AC
  Administered 2015-11-08: 11:00:00
  Filled 2015-11-08: qty 1

## 2015-11-08 NOTE — Progress Notes (Signed)
Order received to discharge Pt.  Telemetry removed and CCMD notified.  IV removed with catheter intact.  Discharge education given on atrial fibrillation, including book Off the Beat.  Handout given on Tikosyn. All questions answered.  Tikosyn prescription given for 14 days d/t Pt going on vacation.  Pt denies chest pain or sob at this time.  Pt stable to discharge.

## 2015-11-08 NOTE — Care Management Note (Signed)
Case Management Note Marvetta Gibbons RN, BSN Unit 2W-Case Manager [phone]  Patient Details  Name: Austin Taylor MRN: [id number] Date of Birth: [birth year]  Subjective/Objective:   Pt admitted with afib- for Tikosyn load                 Action/Plan: PTA pt lived at home- anticipate return home- referral for Tikosyn assistance- per insurance check- S/W [facility]RA @ [place] # [phone]   TIKOSYN 500 MCG BID  NONE FORMULARY  PRIOR APPROVAL - YES # [phone]   DOFETILIDE  500 MCG BID  (30 )   COVER- YES  CO-PAY- $ 95.00  TIER- 4 DRUG  PRIOR APPROVAL - NO  PHARMACY : [facility]   MAIL-ORDER FOR 90 DAY SUPPLY $ 275.0   Spoke with pt and wife at bedside- insurance coverage shared- per conversation they state that they will be leaving out of town on 8/28-returning on 9/2- unless script for Tikosyn can be sent to pharmacy early prior to discharge for wife to be able to pick up on Saturday prior to them leaving pt would need a 14 day supply to get him through until he returns and is able to pick up medication from his pharmacy following his trip. Call made to [facility] who actually has generic drug in stock to fill if script received. - CM to continue to follow.- Pt will need 14 day supply from [facility] upon discharge.   Expected Discharge Date:    11/08/15              Expected Discharge Plan:  Home/Self Care  In-House Referral:     Discharge planning Services  CM Consult, Medication Assistance  Post Acute Care Choice:    Choice offered to:     DME Arranged:    DME Agency:     HH Arranged:    HH Agency:     Status of Service:  Completed, signed off  If discussed at [employer] of Stay Meetings, dates discussed:    Additional Comments:  11/08/15- 1200- Bunny Lowdermilk RN, CM- pt for d/c home today- bedside RN took 14 day script to [facility] and will have it ready for pt on discharge- script for refills has been sent to [facility]. No further CM needs  Dawayne Patricia, RN 11/08/2015, 12:08 PM

## 2015-11-08 NOTE — Plan of Care (Signed)
Problem: Education: Goal: Understanding of medication regimen will improve Outcome: Completed/Met Date Met: 11/08/15 Education given to Pt and significant other on Tikosyn.

## 2015-11-08 NOTE — Discharge Summary (Signed)
ELECTROPHYSIOLOGY PROCEDURE DISCHARGE SUMMARY    Patient ID: Austin Taylor,  MRN: [id number], DOB/AGE: 12/14/1940 75 y.o.  Admit date: 11/05/2015 Discharge date: 11/08/2015  Primary Care Physician: Vena Austria, MD Primary Cardiologist: Meda Coffee Electrophysiologist: Lyris Hitchman  Primary Discharge Diagnosis:  1.  Persistent atrial fibrillation status post Tikosyn loading this admission  Secondary Discharge Diagnosis:  1.  Prior CVA 2.  Hyperlipidemia  Allergies  Allergen Reactions  . Codeine Nausea Only          Procedures This Admission:  1.  Tikosyn loading  Brief HPI: Austin Taylor is a 75 y.o. male with a past medical history as noted above.  They were referred to EP in the outpatient setting for treatment options of atrial fibrillation.  Risks, benefits, and alternatives to Tikosyn were reviewed with the patient who wished to proceed.    Hospital Course:  The patient was admitted and Tikosyn was initiated.  Renal function and electrolytes were followed during the hospitalization.  He initially converted to SR with Tikosyn but then reverted to AF prior to discharge. They were monitored until discharge on telemetry which demonstrated AF.  On the day of discharge, they were examined by Dr Rayann Heman who considered them stable for discharge to home.  Follow-up has been arranged with Roderic Palau, NP in 2 weeks.  He has significant underlying conduction system disease and for this reason, AAD therapy is limited. If still in AF at follow up visit, consider repeat DCCV. May eventually need PVI.   Physical Exam: Vitals:   11/07/15 0415 11/07/15 1337 11/07/15 2104 11/08/15 0453  BP: 122/66 127/77 130/84 (!) 141/83  Pulse: 64 63 65 66  Resp: 19 19 19 18   Temp: 97.7 F (36.5 C) 98.2 F (36.8 C) 98.1 F (36.7 C) 97.7 F (36.5 C)  TempSrc: Oral Oral Oral Oral  SpO2: 94% 97% 95% 96%  Weight:      Height:        GEN- The patient is elderly appearing, alert and  oriented x 3 today.   HEENT: normocephalic, atraumatic; sclera clear, conjunctiva pink; hearing intact; oropharynx clear; neck supple  Lungs- Clear to ausculation bilaterally, normal work of breathing.  No wheezes, rales, rhonchi Heart- Irregular rate and rhythm  GI- soft, non-tender, non-distended, bowel sounds present  Extremities- no clubbing, cyanosis, or edema; DP/PT/radial pulses 2+ bilaterally MS- no significant deformity or atrophy Skin- warm and dry, no rash or lesion Psych- euthymic mood, full affect Neuro- strength and sensation are intact   Labs:   Lab Results  Component Value Date   WBC 6.9 12/27/2013   HGB 14.2 12/27/2013   HCT 41.4 12/27/2013   MCV 89.0 12/27/2013   PLT 204 12/27/2013     Recent Labs Lab 11/08/15 0201  NA 138  K 4.0  CL 102  CO2 29  BUN 13  CREATININE 1.07  CALCIUM 8.8*  GLUCOSE 103*     Discharge Medications:    Medication List    TAKE these medications   apixaban 5 MG Tabs tablet Commonly known as:  ELIQUIS Take 1 tablet (5 mg total) by mouth 2 (two) times daily.   diltiazem 60 MG tablet Commonly known as:  CARDIZEM TAKE 1 TABLET BY MOUTH TWICE DAILY What changed:  See the new instructions.   dofetilide 250 MCG capsule Commonly known as:  TIKOSYN Take 1 capsule (250 mcg total) by mouth 2 (two) times daily.   ferrous sulfate 325 (65 FE) MG tablet Take 325  mg by mouth 3 (three) times a week.   furosemide 40 MG tablet Commonly known as:  LASIX Take 1 tablet (40 mg total) by mouth daily.   sildenafil 20 MG tablet Commonly known as:  REVATIO Take 20 mg by mouth as needed (for ED).   simvastatin 40 MG tablet Commonly known as:  ZOCOR Take 1 tablet (40 mg total) by mouth daily.       Disposition:  Discharge Instructions    Diet - low sodium heart healthy    Complete by:  As directed   Increase activity slowly    Complete by:  As directed     Follow-up Information    [facility] [place] [place] Follow  up on 11/19/2015.   Specialty:  Cardiology Why:  at 9:30AM  Contact information: [id number] [place] [phone]          Duration of Discharge Encounter: Greater than 30 minutes including physician time.  Signed, Chanetta Marshall, NP 11/08/2015 11:09 AM    I have seen, examined the patient, and reviewed the above assessment and plan.  On exam, iRRR.  Changes to above are made where necessary.  Back in afib.  Will continue tikosyn 250 mcg BID.  IF he does not convert to sinus, may require cardioversion when he is seen in follow-up in AF clinic.  Co Sign: [name], MD 11/08/2015 11:12 AM

## 2015-11-08 NOTE — Care Management Important Message (Signed)
Important Message  Patient Details  Name: Austin Taylor MRN: [id number] Date of Birth: [birth year]   Medicare Important Message Given:  Yes    Loann Quill 11/08/2015, 8:57 AM

## 2015-11-13 ENCOUNTER — Other Ambulatory Visit: Payer: Self-pay | Admitting: Cardiology

## 2015-11-19 ENCOUNTER — Ambulatory Visit (HOSPITAL_COMMUNITY)
Admission: RE | Admit: 2015-11-19 | Discharge: 2015-11-19 | Disposition: A | Discharge status: Home / Self Care | Payer: Medicare Other | Source: Ambulatory Visit | Attending: Nurse Practitioner | Admitting: Nurse Practitioner

## 2015-11-19 ENCOUNTER — Encounter (HOSPITAL_COMMUNITY): Payer: Self-pay | Admitting: Nurse Practitioner

## 2015-11-19 VITALS — BP 118/64 | HR 55 | Ht 72.0 in | Wt 242.4 lb

## 2015-11-19 DIAGNOSIS — I4819 Other persistent atrial fibrillation: Secondary | ICD-10-CM

## 2015-11-19 DIAGNOSIS — Z8249 Family history of ischemic heart disease and other diseases of the circulatory system: Secondary | ICD-10-CM | POA: Diagnosis not present

## 2015-11-19 DIAGNOSIS — E785 Hyperlipidemia, unspecified: Secondary | ICD-10-CM | POA: Diagnosis not present

## 2015-11-19 DIAGNOSIS — I509 Heart failure, unspecified: Secondary | ICD-10-CM | POA: Diagnosis not present

## 2015-11-19 DIAGNOSIS — Z885 Allergy status to narcotic agent status: Secondary | ICD-10-CM | POA: Insufficient documentation

## 2015-11-19 DIAGNOSIS — Z7901 Long term (current) use of anticoagulants: Secondary | ICD-10-CM | POA: Diagnosis not present

## 2015-11-19 DIAGNOSIS — I481 Persistent atrial fibrillation: Secondary | ICD-10-CM | POA: Diagnosis not present

## 2015-11-19 DIAGNOSIS — I480 Paroxysmal atrial fibrillation: Secondary | ICD-10-CM | POA: Diagnosis present

## 2015-11-19 DIAGNOSIS — Z87891 Personal history of nicotine dependence: Secondary | ICD-10-CM | POA: Insufficient documentation

## 2015-11-19 DIAGNOSIS — Z79899 Other long term (current) drug therapy: Secondary | ICD-10-CM | POA: Insufficient documentation

## 2015-11-19 DIAGNOSIS — Z8673 Personal history of transient ischemic attack (TIA), and cerebral infarction without residual deficits: Secondary | ICD-10-CM | POA: Diagnosis not present

## 2015-11-19 LAB — CBC
HCT: 43.5 % (ref 39.0–52.0)
Hemoglobin: 14.6 g/dL (ref 13.0–17.0)
MCH: 30.9 pg (ref 26.0–34.0)
MCHC: 33.6 g/dL (ref 30.0–36.0)
MCV: 92.2 fL (ref 78.0–100.0)
PLATELETS: 217 10*3/uL (ref 150–400)
RBC: 4.72 MIL/uL (ref 4.22–5.81)
RDW: 13.3 % (ref 11.5–15.5)
WBC: 7.1 10*3/uL (ref 4.0–10.5)

## 2015-11-19 LAB — MAGNESIUM: Magnesium: 2.1 mg/dL (ref 1.7–2.4)

## 2015-11-19 LAB — BASIC METABOLIC PANEL
ANION GAP: 7 (ref 5–15)
BUN: 14 mg/dL (ref 6–20)
CALCIUM: 8.8 mg/dL — AB (ref 8.9–10.3)
CO2: 26 mmol/L (ref 22–32)
Chloride: 108 mmol/L (ref 101–111)
Creatinine, Ser: 1.07 mg/dL (ref 0.61–1.24)
GLUCOSE: 121 mg/dL — AB (ref 65–99)
POTASSIUM: 3.8 mmol/L (ref 3.5–5.1)
Sodium: 141 mmol/L (ref 135–145)

## 2015-11-19 NOTE — Progress Notes (Signed)
Patient ID: Austin Taylor, male   DOB: 12-09-1940, 75 y.o.   MRN: [id number]    Primary Care Physician: Vena Austria, MD Referring Physician: Dr. Jolinda Croak Austin Taylor is a 75 y.o. male with a h/o PAF dating back to 2 years ago, found at the time of a CVA. He was found to be in afib in June with a check up with Dr. Meda Coffee. He reported racing of the heart that started at 3 am the same morning of visit. He had experienced shortness of breath and fatigue for the previous few weeks. He was scheduled for cardioversion and did convert but had ERAF. He states that for a few weeks, he felt "great" with much more energy.  He is seen in the afib clinic to discuss antiarrythmic therapy to restore SR. EKG shows afib with slow v. rate at 54 bpm, LAFB. EKG's in SR reveal first degree AVB pr int 280 ms. Creat 1.06. Left atrium size is 41 mm. It was discussed with Dr. Rayann Heman and decided due to conduction issues, tikosyn was best option.   He returns to Afib clinic, 9/5, after hospitalization for tikosyn loading 8/22 to 8/25. He initially converted to SR with Tikosyn but then reverted to AF prior to discharge. He was monitored until discharge on telemetry which demonstrated AF.  On the day of discharge,he was examined by Dr Rayann Heman who considered him stable for discharge to home, discharged on tikosyn 250 mcg bid.  He has significant underlying conduction system disease and for this reason, AAD therapy is limited. If still in AF at follow up visit, consider repeat DCCV. May eventually need PVI. Pt does continue in afib, will plan on cardioversion, pt did feel better in SR in hospital, more energy. No missed doses of apixaban.  Today, he denies symptoms of palpitations, chest pain, shortness of breath, orthopnea, PND, lower extremity edema, dizziness, presyncope, syncope, or neurologic sequela. C/o of fatigue. The patient is tolerating medications without difficulties and is otherwise without complaint  today.   Past Medical History:  Diagnosis Date  . CHF (congestive heart failure) ([place])   . Chronic kidney disease    patient denies   . Hyperlipidemia   . Persistent atrial fibrillation ([place])   . Stroke (cerebrum) ([place])   . Visit for monitoring Tikosyn therapy 11/05/2015   Past Surgical History:  Procedure Laterality Date  . CARDIOVERSION N/A 09/09/2015   Procedure: CARDIOVERSION;  Surgeon: Fay Records, MD;  Location: [facility] ENDOSCOPY;  Service: Cardiovascular;  Laterality: N/A;  . MOHS SURGERY    . ROTATOR CUFF REPAIR Left 1998    Current Outpatient Prescriptions  Medication Sig Dispense Refill  . apixaban (ELIQUIS) 5 MG TABS tablet Take 1 tablet (5 mg total) by mouth 2 (two) times daily. 60 tablet 11  . diltiazem (CARDIZEM) 60 MG tablet TAKE 1 TABLET BY MOUTH TWICE DAILY (Patient taking differently: Take 60 mg by mouth twice daily) 60 tablet 11  . dofetilide (TIKOSYN) 250 MCG capsule Take 1 capsule (250 mcg total) by mouth 2 (two) times daily. 60 capsule 1  . ferrous sulfate 325 (65 FE) MG tablet Take 325 mg by mouth 3 (three) times a week.     . furosemide (LASIX) 40 MG tablet Take 1 tablet (40 mg total) by mouth daily. 90 tablet 3  . sildenafil (REVATIO) 20 MG tablet Take 20 mg by mouth as needed (for ED).     Marland Kitchen simvastatin (ZOCOR) 40 MG tablet TAKE 1 TABLET  BY MOUTH ONCE DAILY 30 tablet 10   No current facility-administered medications for this encounter.     Allergies  Allergen Reactions  . Codeine Nausea Only         Social History   Social History  . Marital status: Married    Spouse name: N/A  . Number of children: 2  . Years of education: 14   Occupational History  . Firefighter Other    Retired   Social History Main Topics  . Smoking status: Former Smoker    Quit date: 08/02/1974  . Smokeless tobacco: Never Used  . Alcohol use Yes     Comment: occassional  . Drug use: No  . Sexual activity: Not on file   Other Topics Concern  . Not on file    Social History Narrative   Patient is married with 2 children.   Patient is right handed.   Patient has 14 yrs of education.   Patient does not drink caffeine.    Family History  Problem Relation Age of Onset  . Heart disease Mother   . Heart disease Father   . Other Father     carotid artey stenosis  . Heart disease Brother     ROS- All systems are reviewed and negative except as per the HPI above  Physical Exam: Vitals:   11/19/15 0906  BP: 118/64  Pulse: (!) 55  Weight: 242 lb 6.4 oz (110 kg)  Height: 6' (1.829 m)    GEN- The patient is well appearing, alert and oriented x 3 today.   Head- normocephalic, atraumatic Eyes-  Sclera clear, conjunctiva pink Ears- hearing intact Oropharynx- clear Neck- supple, no JVP Lymph- no cervical lymphadenopathy Lungs- Clear to ausculation bilaterally, normal work of breathing Heart-Slow, irregular rate and rhythm, no murmurs, rubs or gallops, PMI not laterally displaced GI- soft, NT, ND, + BS Extremities- no clubbing, cyanosis, or edema MS- no significant deformity or atrophy Skin- no rash or lesion Psych- euthymic mood, full affect Neuro- strength and sensation are intact  EKG-afib with slow v rate, qrs int 82 ms, qtc 480 ms, LAFB,  Echo-10/15 - Left ventricle: The cavity size was normal. There was mild concentric hypertrophy. Systolic function was normal. The estimated ejection fraction was in the range of 60% to 65%. Wall motion was normal; there were no regional wall motion abnormalities. Features are consistent with a pseudonormal left ventricular filling pattern, with concomitant abnormal relaxation and increased filling pressure (grade 2 diastolic dysfunction). There was no evidence of elevated ventricular filling pressure by Doppler parameters. - Aortic valve: There was no regurgitation. - Aortic root: The aortic root was normal in size. - Mitral valve: There was mild regurgitation. - Left  atrium: The atrium was mildly dilated. - Right ventricle: Systolic function was normal. - Right atrium: The atrium was normal in size. - Tricuspid valve: There was mild regurgitation. - Pulmonary arteries: Systolic pressure was within the normal range. - Inferior vena cava: The vessel was normal in size. - Pericardium, extracardiac: There was no pericardial effusion.  Impressions:  - Normal biventricular size and systolic function. Pseudonormal pattern of diastolic dysfunction with normal filling pressures. No significant valvular abnormality.  ASSESSMENT/PLAN  1. Persistent  Symptomatic afib with slow ventricular response Early failure of  Tikosyn,  per Dr. Jackalyn Lombard plan, try cardioversion and if fails, may need PVI. Scheduled for 8/6. No missed dosed of apixaban Continue apixaban 5 mg bid for chadsvasc score of 3 Continue on cardizem 60 mg  bid, but hold am of cardioversion, since pt tends to run slow  F/u in one week in afib clinic  [place]. Jenille Laszlo, [facility] [address] [place],  [address] [phone]

## 2015-11-19 NOTE — Patient Instructions (Signed)
Cardioversion scheduled for Wednesday, September 6th  - Arrive at the [facility] and go to admitting at [facility] not eat or drink anything after midnight the night prior to your procedure.  - Take all your medication with a sip of water prior to arrival.  HOLD your cardizem     tomorrow morning.  - You will not be able to drive home after your procedure.

## 2015-11-20 ENCOUNTER — Ambulatory Visit (HOSPITAL_COMMUNITY): Payer: Medicare Other | Admitting: Anesthesiology

## 2015-11-20 ENCOUNTER — Encounter (HOSPITAL_COMMUNITY)
Admission: RE | Disposition: A | Discharge status: Home / Self Care | Payer: Self-pay | Source: Ambulatory Visit | Attending: Internal Medicine

## 2015-11-20 ENCOUNTER — Ambulatory Visit (HOSPITAL_COMMUNITY)
Admission: RE | Admit: 2015-11-20 | Discharge: 2015-11-20 | Disposition: A | Discharge status: Home / Self Care | Payer: Medicare Other | Source: Ambulatory Visit | Attending: Internal Medicine | Admitting: Internal Medicine

## 2015-11-20 ENCOUNTER — Encounter (HOSPITAL_COMMUNITY): Payer: Self-pay

## 2015-11-20 DIAGNOSIS — Z7901 Long term (current) use of anticoagulants: Secondary | ICD-10-CM | POA: Diagnosis not present

## 2015-11-20 DIAGNOSIS — I4891 Unspecified atrial fibrillation: Secondary | ICD-10-CM | POA: Diagnosis present

## 2015-11-20 DIAGNOSIS — I480 Paroxysmal atrial fibrillation: Secondary | ICD-10-CM | POA: Diagnosis not present

## 2015-11-20 DIAGNOSIS — Z79899 Other long term (current) drug therapy: Secondary | ICD-10-CM | POA: Insufficient documentation

## 2015-11-20 DIAGNOSIS — I509 Heart failure, unspecified: Secondary | ICD-10-CM | POA: Insufficient documentation

## 2015-11-20 DIAGNOSIS — Z87891 Personal history of nicotine dependence: Secondary | ICD-10-CM | POA: Diagnosis not present

## 2015-11-20 DIAGNOSIS — Z8673 Personal history of transient ischemic attack (TIA), and cerebral infarction without residual deficits: Secondary | ICD-10-CM | POA: Diagnosis not present

## 2015-11-20 DIAGNOSIS — I110 Hypertensive heart disease with heart failure: Secondary | ICD-10-CM | POA: Insufficient documentation

## 2015-11-20 DIAGNOSIS — I5033 Acute on chronic diastolic (congestive) heart failure: Secondary | ICD-10-CM

## 2015-11-20 DIAGNOSIS — E785 Hyperlipidemia, unspecified: Secondary | ICD-10-CM | POA: Diagnosis not present

## 2015-11-20 DIAGNOSIS — I481 Persistent atrial fibrillation: Secondary | ICD-10-CM | POA: Diagnosis not present

## 2015-11-20 HISTORY — PX: CARDIOVERSION: SHX1299

## 2015-11-20 SURGERY — CARDIOVERSION
Anesthesia: Monitor Anesthesia Care

## 2015-11-20 MED ORDER — LIDOCAINE HCL (CARDIAC) 20 MG/ML IV SOLN
INTRAVENOUS | Status: DC | PRN
  Administered 2015-11-20: 20 mg via INTRATRACHEAL

## 2015-11-20 MED ORDER — SODIUM CHLORIDE 0.9 % IV SOLN
INTRAVENOUS | Status: DC
  Administered 2015-11-20: 500 mL via INTRAVENOUS

## 2015-11-20 MED ORDER — PROPOFOL 10 MG/ML IV BOLUS
INTRAVENOUS | Status: DC | PRN
  Administered 2015-11-20: 60 mg via INTRAVENOUS

## 2015-11-20 NOTE — H&P (Signed)
     INTERVAL PROCEDURE H&P  History and Physical Interval Note:  11/20/2015 11:22 AM  Austin Taylor has presented today for their planned procedure. The various methods of treatment have been discussed with the patient and family. After consideration of risks, benefits and other options for treatment, the patient has consented to the procedure.  The patients' outpatient history has been reviewed, patient examined, and no change in status from most recent office note within the past 30 days. I have reviewed the patients' chart and labs and will proceed as planned. Questions were answered to the patient's satisfaction.   Austin Casino, MD, [facility] Attending Cardiologist Austin Taylor 11/20/2015, 11:22 AM

## 2015-11-20 NOTE — Anesthesia Preprocedure Evaluation (Signed)
Anesthesia Evaluation  Patient identified by MRN, date of birth, ID band Patient awake    Reviewed: Allergy & Precautions, NPO status , Patient's Chart, lab work & pertinent test results, reviewed documented beta blocker date and time   History of Anesthesia Complications Negative for: history of anesthetic complications  Airway Mallampati: II  TM Distance: >3 FB Neck ROM: Full    Dental   Pulmonary shortness of breath, former smoker,    Pulmonary exam normal        Cardiovascular hypertension, Pt. on medications + Peripheral Vascular Disease and +CHF  Normal cardiovascular exam     Neuro/Psych CVA, No Residual Symptoms negative psych ROS   GI/Hepatic GERD  ,  Endo/Other  negative endocrine ROS  Renal/GU Renal disease     Musculoskeletal negative musculoskeletal ROS (+)   Abdominal   Peds  Hematology negative hematology ROS (+)   Anesthesia Other Findings   Reproductive/Obstetrics negative OB ROS                             Anesthesia Physical  Anesthesia Plan  ASA: III  Anesthesia Plan: MAC   Post-op Pain Management:    Induction: Intravenous  Airway Management Planned: Mask  Additional Equipment:   Intra-op Plan:   Post-operative Plan:   Informed Consent: I have reviewed the patients History and Physical, chart, labs and discussed the procedure including the risks, benefits and alternatives for the proposed anesthesia with the patient or authorized representative who has indicated his/her understanding and acceptance.     Plan Discussed with: CRNA and Surgeon  Anesthesia Plan Comments:         Anesthesia Quick Evaluation

## 2015-11-20 NOTE — Anesthesia Postprocedure Evaluation (Signed)
Anesthesia Post Note  Patient: Austin Taylor  Procedure(s) Performed: Procedure(s) (LRB): CARDIOVERSION (N/A)  Patient location during evaluation: PACU Anesthesia Type: MAC Level of consciousness: awake and alert Pain management: pain level controlled Vital Signs Assessment: post-procedure vital signs reviewed and stable Respiratory status: spontaneous breathing, nonlabored ventilation, respiratory function stable and patient connected to nasal cannula oxygen Cardiovascular status: stable and blood pressure returned to baseline Anesthetic complications: no    Last Vitals:  Vitals:   11/20/15 1204 11/20/15 1222  BP: (!) 141/80 (!) 172/84  Pulse:  71  Resp:  19  Temp:      Last Pain:  Vitals:   11/20/15 1101  TempSrc: Oral                 Zenaida Deed

## 2015-11-20 NOTE — Discharge Instructions (Signed)

## 2015-11-20 NOTE — Transfer of Care (Signed)
Immediate Anesthesia Transfer of Care Note  Patient: Austin Taylor  Procedure(s) Performed: Procedure(s): CARDIOVERSION (N/A)  Patient Location: Endoscopy Unit  Anesthesia Type:MAC  Level of Consciousness: awake, alert  and oriented  Airway & Oxygen Therapy: Patient Spontanous Breathing and Patient connected to nasal cannula oxygen  Post-op Assessment: Report given to RN, Post -op Vital signs reviewed and stable and Patient moving all extremities X 4  Post vital signs: Reviewed and stable  Last Vitals:  Vitals:   11/20/15 1101 11/20/15 1204  BP: (!) 182/152 (!) 141/80  Pulse: 63   Resp: 18   Temp: 36.2 C     Last Pain:  Vitals:   11/20/15 1101  TempSrc: Oral         Complications: No apparent anesthesia complications

## 2015-11-20 NOTE — CV Procedure (Signed)
    CARDIOVERSION NOTE  Procedure: Electrical Cardioversion Indications:  Atrial Fibrillation  Procedure Details:  Consent: Risks of procedure as well as the alternatives and risks of each were explained to the (patient/caregiver).  Consent for procedure obtained.  Time Out: Verified patient identification, verified procedure, site/side was marked, verified correct patient position, special equipment/implants available, medications/allergies/relevent history reviewed, required imaging and test results available.  Performed  Patient placed on cardiac monitor, pulse oximetry, supplemental oxygen as necessary.  Sedation given: Propofol per anesthesia Pacer pads placed anterior and posterior chest.  Cardioverted 2 time(s).  Cardioverted at 150J and 200J biphasic.  Impression: Findings: Post procedure EKG shows: Atrial Fibrillation Complications: None Patient did tolerate procedure well.  Plan: 1. Brief conversion to sinus with each shock at 150J and 200J biphasic, but the rhythm quickly reverted back to atrial fibrillation. May need evaluation for ablation. 2. Advised to continue tikosyn and Eliquis for now.  Time Spent Directly with the Patient:  30 minutes   Pixie Casino, MD, [facility] Attending Cardiologist [place] 11/20/2015, 12:33 PM

## 2015-11-21 ENCOUNTER — Encounter (HOSPITAL_COMMUNITY): Payer: Self-pay | Admitting: Internal Medicine

## 2015-11-25 ENCOUNTER — Encounter: Payer: Self-pay | Admitting: *Deleted

## 2015-11-25 ENCOUNTER — Inpatient Hospital Stay (HOSPITAL_COMMUNITY): Admission: RE | Admit: 2015-11-25 | Payer: Medicare Other | Source: Ambulatory Visit | Admitting: Nurse Practitioner

## 2015-12-06 ENCOUNTER — Encounter: Payer: Self-pay | Admitting: Internal Medicine

## 2015-12-09 ENCOUNTER — Encounter: Payer: Self-pay | Admitting: Internal Medicine

## 2015-12-09 ENCOUNTER — Ambulatory Visit (INDEPENDENT_AMBULATORY_CARE_PROVIDER_SITE_OTHER): Payer: Medicare Other | Admitting: Internal Medicine

## 2015-12-09 VITALS — BP 110/78 | HR 67 | Ht 72.0 in | Wt 242.2 lb

## 2015-12-09 DIAGNOSIS — I440 Atrioventricular block, first degree: Secondary | ICD-10-CM

## 2015-12-09 DIAGNOSIS — I481 Persistent atrial fibrillation: Secondary | ICD-10-CM

## 2015-12-09 DIAGNOSIS — E663 Overweight: Secondary | ICD-10-CM | POA: Diagnosis not present

## 2015-12-09 DIAGNOSIS — I4819 Other persistent atrial fibrillation: Secondary | ICD-10-CM

## 2015-12-09 MED ORDER — DILTIAZEM HCL 60 MG PO TABS: ORAL_TABLET | ORAL | 11 refills | Status: DC

## 2015-12-09 NOTE — Progress Notes (Signed)
    PCP: Vena Austria, MD Primary Cardiologist:  Dr Juliene Pina is a 75 y.o. male who presents today for routine electrophysiology followup.  Since starting tikosyn, the patient reports doing reasonably well.  He had persistence of afib for which he underwent cardioversion 11/20/15.  Interestingly, his cardioversion was unsuccessful.  He nevertheless reports that about a week ago, he converted to sinus rhythm and has been in sinus since.  Today, he denies symptoms of palpitations, chest pain, shortness of breath,  lower extremity edema, dizziness, presyncope, or syncope.  The patient is otherwise without complaint today.   Past Medical History:  Diagnosis Date  . CHF (congestive heart failure) ([place])   . Chronic kidney disease    patient denies   . Hyperlipidemia   . Persistent atrial fibrillation ([place])   . Stroke (cerebrum) ([place])   . Visit for monitoring Tikosyn therapy 11/05/2015   Past Surgical History:  Procedure Laterality Date  . CARDIOVERSION N/A 09/09/2015   Procedure: CARDIOVERSION;  Surgeon: Fay Records, MD;  Location: [place];  Service: Cardiovascular;  Laterality: N/A;  . CARDIOVERSION N/A 11/20/2015   Procedure: CARDIOVERSION;  Surgeon: Pixie Casino, MD;  Location: [place];  Service: Cardiovascular;  Laterality: N/A;  . MOHS SURGERY    . ROTATOR CUFF REPAIR Left 1998    ROS- all systems are reviewed and negatives except as per HPI above  Current Outpatient Prescriptions  Medication Sig Dispense Refill  . apixaban (ELIQUIS) 5 MG TABS tablet Take 1 tablet (5 mg total) by mouth 2 (two) times daily. 60 tablet 11  . diltiazem (CARDIZEM) 60 MG tablet TAKE 1 TABLET BY MOUTH TWICE DAILY 60 tablet 11  . dofetilide (TIKOSYN) 250 MCG capsule Take 1 capsule (250 mcg total) by mouth 2 (two) times daily. 60 capsule 1  . ferrous sulfate 325 (65 FE) MG tablet Take 325 mg by mouth 3 (three) times a week.     . furosemide (LASIX) 40 MG tablet Take 1 tablet  (40 mg total) by mouth daily. 90 tablet 3  . sildenafil (REVATIO) 20 MG tablet Take 20 mg by mouth as needed (for ED). Take as directed    . simvastatin (ZOCOR) 40 MG tablet TAKE 1 TABLET BY MOUTH ONCE DAILY 30 tablet 10   No current facility-administered medications for this visit.     Physical Exam: Vitals:   12/09/15 0900  BP: 110/78  Pulse: 67  Weight: 242 lb 3.2 oz (109.9 kg)  Height: 6' (1.829 m)    GEN- The patient is well appearing, alert and oriented x 3 today.   Head- normocephalic, atraumatic Eyes-  Sclera clear, conjunctiva pink Ears- hearing intact Oropharynx- clear Lungs- Clear to ausculation bilaterally, normal work of breathing Heart- Regular rate and rhythm, no murmurs, rubs or gallops, PMI not laterally displaced GI- soft, NT, ND, + BS Extremities- no clubbing, cyanosis, or edema  ekg today reveals sinus rhythm 67 bpm, PR 330 msec, Qtc 473 msec  Assessment and Plan:  1. Persistent afib Now in sinus on tikosyn Reduce cardizem to 30mg  BID Consider ablation if AF returns  2. First degree AV block Limits our medical options for afib Reduce cardizem to 30mg  BID  3. obesity Body mass index is 32.85 kg/m. Weight loss and regular exercise advised afib clinic nutrition call flyer given to patient  Return to see me in 4-6 weeks  [name] MD, [facility] 12/09/2015 9:24 AM

## 2015-12-09 NOTE — Patient Instructions (Signed)
Medication Instructions:  Your physician has recommended you make the following change in your medication:  1) Decrease Diltiazem to 30 mg (1/2 tablet) twice daily. If you feel you are in AFIB take 60 mg twice daily   Labwork: None ordered   Testing/Procedures: None ordered   Follow-Up: Your physician wants you to follow-up in: 4-6 weeks with Dr. Rayann Heman.    Any Other Special Instructions Will Be Listed Below (If Applicable).     If you need a refill on your cardiac medications before your next appointment, please call your pharmacy.

## 2016-01-07 ENCOUNTER — Encounter: Payer: Self-pay | Admitting: Internal Medicine

## 2016-01-20 ENCOUNTER — Ambulatory Visit (INDEPENDENT_AMBULATORY_CARE_PROVIDER_SITE_OTHER): Payer: Medicare Other | Admitting: Internal Medicine

## 2016-01-20 ENCOUNTER — Encounter: Payer: Self-pay | Admitting: Internal Medicine

## 2016-01-20 VITALS — BP 112/70 | HR 66 | Ht 72.0 in | Wt 243.2 lb

## 2016-01-20 DIAGNOSIS — E663 Overweight: Secondary | ICD-10-CM

## 2016-01-20 DIAGNOSIS — I481 Persistent atrial fibrillation: Secondary | ICD-10-CM | POA: Diagnosis not present

## 2016-01-20 DIAGNOSIS — I440 Atrioventricular block, first degree: Secondary | ICD-10-CM

## 2016-01-20 DIAGNOSIS — I4819 Other persistent atrial fibrillation: Secondary | ICD-10-CM

## 2016-01-20 LAB — BASIC METABOLIC PANEL
BUN: 13 mg/dL (ref 7–25)
CALCIUM: 9.1 mg/dL (ref 8.6–10.3)
CO2: 25 mmol/L (ref 20–31)
Chloride: 105 mmol/L (ref 98–110)
Creat: 1.03 mg/dL (ref 0.70–1.18)
Glucose, Bld: 86 mg/dL (ref 65–99)
POTASSIUM: 4.1 mmol/L (ref 3.5–5.3)
SODIUM: 141 mmol/L (ref 135–146)

## 2016-01-20 LAB — MAGNESIUM: Magnesium: 2.1 mg/dL (ref 1.5–2.5)

## 2016-01-20 MED ORDER — DOFETILIDE 250 MCG PO CAPS: 250.0000 ug | ORAL_CAPSULE | Freq: Two times a day (BID) | ORAL | 11 refills | Status: DC

## 2016-01-20 NOTE — Progress Notes (Signed)
PCP: Vena Austria, MD Primary Cardiologist:  Dr Juliene Pina is a 75 y.o. male who presents today for routine electrophysiology followup.  Since his last office visit, the patient reports doing reasonably well.  He is unaware of any further afib.  His energy is good and he is active.  Today, he denies symptoms of palpitations, chest pain, shortness of breath,  lower extremity edema, dizziness, presyncope, or syncope.  The patient is otherwise without complaint today.   Past Medical History:  Diagnosis Date  . CHF (congestive heart failure) ([place])   . Chronic kidney disease    patient denies   . Hyperlipidemia   . Persistent atrial fibrillation ([place])   . Stroke (cerebrum) ([place])   . Visit for monitoring Tikosyn therapy 11/05/2015   Past Surgical History:  Procedure Laterality Date  . CARDIOVERSION N/A 09/09/2015   Procedure: CARDIOVERSION;  Surgeon: Fay Records, MD;  Location: [place];  Service: Cardiovascular;  Laterality: N/A;  . CARDIOVERSION N/A 11/20/2015   Procedure: CARDIOVERSION;  Surgeon: Pixie Casino, MD;  Location: [place];  Service: Cardiovascular;  Laterality: N/A;  . MOHS SURGERY    . ROTATOR CUFF REPAIR Left 1998    ROS- all systems are reviewed and negatives except as per HPI above  Current Outpatient Prescriptions  Medication Sig Dispense Refill  . apixaban (ELIQUIS) 5 MG TABS tablet Take 1 tablet (5 mg total) by mouth 2 (two) times daily. 60 tablet 11  . diltiazem (CARDIZEM) 60 MG tablet Take 30-60 mg by mouth twice daily.  Take 30 mg twice daily. If you feel you are in AFIB take 60 mg twice daily 60 tablet 11  . dofetilide (TIKOSYN) 250 MCG capsule Take 1 capsule (250 mcg total) by mouth 2 (two) times daily. 60 capsule 1  . ferrous sulfate 325 (65 FE) MG tablet Take 325 mg by mouth 3 (three) times a week.     . furosemide (LASIX) 40 MG tablet Take 1 tablet (40 mg total) by mouth daily. 90 tablet 3  . sildenafil (REVATIO) 20 MG  tablet Take 20 mg by mouth as needed (for ED). Take as directed    . simvastatin (ZOCOR) 40 MG tablet TAKE 1 TABLET BY MOUTH ONCE DAILY 30 tablet 10   No current facility-administered medications for this visit.     Physical Exam: Vitals:   01/20/16 1141  BP: 112/70  Pulse: 66  Weight: 243 lb 3.2 oz (110.3 kg)  Height: 6' (1.829 m)    GEN- The patient is well appearing, alert and oriented x 3 today.   Head- normocephalic, atraumatic Eyes-  Sclera clear, conjunctiva pink Ears- hearing intact Oropharynx- clear Lungs- Clear to ausculation bilaterally, normal work of breathing Heart- Regular rate and rhythm, no murmurs, rubs or gallops, PMI not laterally displaced GI- soft, NT, ND, + BS Extremities- no clubbing, cyanosis, or edema  ekg today reveals sinus rhythm 66 bpm, PR 228 msec, Qtc 499 msec  Assessment and Plan:  1. Persistent afib Now in sinus on tikosyn No changes today Bmet, mg today Consider ablation if AF returns  2. First degree AV block Limits our medical options for afib Continue low dose diltiazem.  Maybe this can be discontinued if no afib upon return  3. obesity Body mass index is 32.98 kg/m. Weight loss and regular exercise advised  Return to see Butch Penny in the AF clinic in 3 months I will see in 6 months  [name] MD, [facility]  01/20/2016 12:21 PM

## 2016-01-20 NOTE — Patient Instructions (Signed)
Medication Instructions:  Your physician recommends that you continue on your current medications as directed. Please refer to the Current Medication list given to you today.   Labwork: Your physician recommends that you return for lab work today: BMP/Mag   Testing/Procedures: None ordered   Follow-Up: Your physician recommends that you schedule a follow-up appointment in: 3 months with Roderic Palau, NP and 6 months with Dr Rayann Heman   Any Other Special Instructions Will Be Listed Below (If Applicable).     If you need a refill on your cardiac medications before your next appointment, please call your pharmacy.

## 2016-03-11 ENCOUNTER — Telehealth: Payer: Self-pay | Admitting: Pharmacist

## 2016-03-11 NOTE — Telephone Encounter (Signed)
Received Fax re Clearance to HOLD Eliquis prior to colonoscopy scheduled for 04/10/15.  Patient on Eliquis for Afib (CHADS2=4 ; HF, stroke, age). Converted to sinus rhythm and has been in sinus since November 20 2015 after failed cardioversion (>3 months ago)   Faythe Ghee to hold Eliquis the night before colonoscopy (04/09/15) as requested. Resume ASAP after procedure (recommendation 12 hours after procedure).  Fax sent to [facility] Physician @ [place] [facility], [place] [id number] 03/11/2016 7:47 AM

## 2016-03-31 ENCOUNTER — Other Ambulatory Visit: Payer: Self-pay | Admitting: Cardiology

## 2016-03-31 DIAGNOSIS — I480 Paroxysmal atrial fibrillation: Secondary | ICD-10-CM

## 2016-03-31 DIAGNOSIS — E785 Hyperlipidemia, unspecified: Secondary | ICD-10-CM

## 2016-03-31 DIAGNOSIS — I5033 Acute on chronic diastolic (congestive) heart failure: Secondary | ICD-10-CM

## 2016-04-21 ENCOUNTER — Ambulatory Visit (HOSPITAL_COMMUNITY)
Admission: RE | Admit: 2016-04-21 | Discharge: 2016-04-21 | Disposition: A | Discharge status: Home / Self Care | Payer: Medicare Other | Source: Ambulatory Visit | Attending: Internal Medicine | Admitting: Internal Medicine

## 2016-04-21 ENCOUNTER — Encounter (HOSPITAL_COMMUNITY)
Admission: RE | Disposition: A | Discharge status: Home / Self Care | Payer: Self-pay | Source: Ambulatory Visit | Attending: Internal Medicine

## 2016-04-21 ENCOUNTER — Encounter (HOSPITAL_COMMUNITY): Payer: Self-pay | Admitting: Nurse Practitioner

## 2016-04-21 ENCOUNTER — Ambulatory Visit (HOSPITAL_BASED_OUTPATIENT_CLINIC_OR_DEPARTMENT_OTHER)
Admission: RE | Admit: 2016-04-21 | Discharge: 2016-04-21 | Disposition: A | Discharge status: Home / Self Care | Payer: Medicare Other | Source: Ambulatory Visit | Attending: Nurse Practitioner | Admitting: Nurse Practitioner

## 2016-04-21 VITALS — BP 118/74 | HR 63 | Ht 72.0 in | Wt 239.6 lb

## 2016-04-21 DIAGNOSIS — Z87891 Personal history of nicotine dependence: Secondary | ICD-10-CM | POA: Diagnosis not present

## 2016-04-21 DIAGNOSIS — I4819 Other persistent atrial fibrillation: Secondary | ICD-10-CM

## 2016-04-21 DIAGNOSIS — Z7901 Long term (current) use of anticoagulants: Secondary | ICD-10-CM | POA: Insufficient documentation

## 2016-04-21 DIAGNOSIS — I440 Atrioventricular block, first degree: Secondary | ICD-10-CM

## 2016-04-21 DIAGNOSIS — I481 Persistent atrial fibrillation: Secondary | ICD-10-CM

## 2016-04-21 DIAGNOSIS — I4891 Unspecified atrial fibrillation: Secondary | ICD-10-CM | POA: Diagnosis not present

## 2016-04-21 DIAGNOSIS — E785 Hyperlipidemia, unspecified: Secondary | ICD-10-CM | POA: Diagnosis not present

## 2016-04-21 DIAGNOSIS — Z8249 Family history of ischemic heart disease and other diseases of the circulatory system: Secondary | ICD-10-CM | POA: Insufficient documentation

## 2016-04-21 DIAGNOSIS — Z8673 Personal history of transient ischemic attack (TIA), and cerebral infarction without residual deficits: Secondary | ICD-10-CM | POA: Insufficient documentation

## 2016-04-21 DIAGNOSIS — Z6832 Body mass index (BMI) 32.0-32.9, adult: Secondary | ICD-10-CM | POA: Diagnosis not present

## 2016-04-21 DIAGNOSIS — N189 Chronic kidney disease, unspecified: Secondary | ICD-10-CM | POA: Insufficient documentation

## 2016-04-21 DIAGNOSIS — E669 Obesity, unspecified: Secondary | ICD-10-CM | POA: Insufficient documentation

## 2016-04-21 HISTORY — PX: LOOP RECORDER INSERTION: EP1214

## 2016-04-21 LAB — BASIC METABOLIC PANEL
ANION GAP: 9 (ref 5–15)
BUN: 12 mg/dL (ref 6–20)
CALCIUM: 9.1 mg/dL (ref 8.9–10.3)
CO2: 28 mmol/L (ref 22–32)
Chloride: 102 mmol/L (ref 101–111)
Creatinine, Ser: 1.07 mg/dL (ref 0.61–1.24)
GFR calc Af Amer: >60 mL/min (ref >60)
GFR calc non Af Amer: >60 mL/min (ref >60)
GLUCOSE: 108 mg/dL — AB (ref 65–99)
Potassium: 4.1 mmol/L (ref 3.5–5.1)
Sodium: 139 mmol/L (ref 135–145)

## 2016-04-21 LAB — MAGNESIUM: Magnesium: 2.1 mg/dL (ref 1.7–2.4)

## 2016-04-21 SURGERY — LOOP RECORDER INSERTION

## 2016-04-21 MED ORDER — LIDOCAINE HCL (PF) 1 % IJ SOLN
INTRAMUSCULAR | Status: AC
  Filled 2016-04-21: qty 30

## 2016-04-21 MED ORDER — LIDOCAINE HCL (PF) 1 % IJ SOLN
INTRAMUSCULAR | Status: DC | PRN
  Administered 2016-04-21: 30 mL

## 2016-04-21 SURGICAL SUPPLY — 2 items
LOOP REVEAL LINQSYS (Prosthesis & Implant Heart) ×3 IMPLANT
PACK LOOP INSERTION (CUSTOM PROCEDURE TRAY) ×3 IMPLANT

## 2016-04-21 NOTE — Progress Notes (Signed)
Primary Care Physician: Vena Austria, MD Primary Cardiologist: Meda Coffee Primary Electrophysiologist: Zonia Kief is a 76 y.o. male with a history of persistent atrial fibrillation who presents for follow up in the [facility].  Since last being seen in clinic, the patient reports doing reasonably well.  He has had periods of shortness of breath with exertion that come and go.  He has never had palpitations with atrial fibrillation.  He in the past has had fatigue which he has also had intermittently.  He recently underwent colonoscopy and was told that he should be screened for OSA.  He has been set up for sleep study by PCP.  Today, he  denies symptoms of palpitations, chest pain, orthopnea, PND, lower extremity edema, dizziness, presyncope, syncope, bleeding, or neurologic sequela. The patient is tolerating medications without difficulties and is otherwise without complaint today.    Atrial Fibrillation Risk Factors:  he does have symptoms of sleep apnea. Sleep study pending - ordered by primary care  he does not have a history of rheumatic fever.  he does not have a history of alcohol use.  he has a BMI of Body mass index is 32.5 kg/m.Marland Kitchen Filed Weights   04/21/16 0837  Weight: 239 lb 9.6 oz (108.7 kg)    LA size: 24   Atrial Fibrillation Management history:  Previous antiarrhythmic drugs: Tikosyn  Previous cardioversions: 08/2015, 11/2015  Previous ablations: none  CHADS2VASC score: 4  Anticoagulation history: Eliquis   Past Medical History:  Diagnosis Date  . Chronic kidney disease    patient denies   . Hyperlipidemia   . Persistent atrial fibrillation ([place])   . Stroke (cerebrum) [facility])    Past Surgical History:  Procedure Laterality Date  . CARDIOVERSION N/A 09/09/2015   Procedure: CARDIOVERSION;  Surgeon: Fay Records, MD;  Location: [place];  Service: Cardiovascular;  Laterality: N/A;  . CARDIOVERSION N/A  11/20/2015   Procedure: CARDIOVERSION;  Surgeon: Pixie Casino, MD;  Location: [place];  Service: Cardiovascular;  Laterality: N/A;  . MOHS SURGERY    . ROTATOR CUFF REPAIR Left 1998    Current Outpatient Prescriptions  Medication Sig Dispense Refill  . diltiazem (CARDIZEM) 60 MG tablet Take 30-60 mg by mouth twice daily.  Take 30 mg twice daily. If you feel you are in AFIB take 60 mg twice daily 60 tablet 11  . dofetilide (TIKOSYN) 250 MCG capsule Take 1 capsule (250 mcg total) by mouth 2 (two) times daily. 60 capsule 11  . ELIQUIS 5 MG TABS tablet TAKE 1 TABLET BY MOUTH TWICE DAILY 60 tablet 5  . ferrous sulfate 325 (65 FE) MG tablet Take 325 mg by mouth 3 (three) times a week.     . furosemide (LASIX) 40 MG tablet Take 1 tablet (40 mg total) by mouth daily. 90 tablet 3  . sildenafil (REVATIO) 20 MG tablet Take 20 mg by mouth as needed (for ED). Take as directed    . simvastatin (ZOCOR) 40 MG tablet TAKE 1 TABLET BY MOUTH ONCE DAILY 30 tablet 10   No current facility-administered medications for this encounter.     Allergies  Allergen Reactions  . Codeine Nausea Only         Social History   Social History  . Marital status: Married    Spouse name: N/A  . Number of children: 2  . Years of education: 14   Occupational History  . Firefighter Other  Retired   Social History Main Topics  . Smoking status: Former Smoker    Quit date: 08/02/1974  . Smokeless tobacco: Never Used  . Alcohol use Yes     Comment: occassional  . Drug use: No  . Sexual activity: Not on file   Other Topics Concern  . Not on file   Social History Narrative   Patient is married with 2 children.   Patient is right handed.   Patient has 14 yrs of education.   Patient does not drink caffeine.    Family History  Problem Relation Age of Onset  . Heart disease Mother   . Heart disease Father   . Other Father     Carotid Artery Stenosis  . Heart disease Brother     ROS- All  systems are reviewed and negative except as per the HPI above.  Physical Exam: Vitals:   04/21/16 0837  BP: 118/74  Pulse: 63  Weight: 239 lb 9.6 oz (108.7 kg)  Height: 6' (1.829 m)    GEN- The patient is well appearing, alert and oriented x 3 today.   Head- normocephalic, atraumatic Eyes-  Sclera clear, conjunctiva pink Ears- hearing intact Oropharynx- clear Neck- supple  Lungs- Clear to ausculation bilaterally, normal work of breathing Heart- Regular rate and rhythm, no murmurs, rubs or gallops  GI- soft, NT, ND, + BS Extremities- no clubbing, cyanosis, or edema MS- no significant deformity or atrophy Skin- no rash or lesion Psych- euthymic mood, full affect Neuro- strength and sensation are intact  Wt Readings from Last 3 Encounters:  04/21/16 239 lb 9.6 oz (108.7 kg)  01/20/16 243 lb 3.2 oz (110.3 kg)  12/09/15 242 lb 3.2 oz (109.9 kg)    EKG today demonstrates sinus rhythm, 1st degree AV block, QTc 421msec Echo 10/2015 demonstrated EF Q000111Q, grade 2 diastolic dysfunction, LA 24  Epic records are reviewed at length today  Assessment and Plan:  1. Persistent atrial fibrillation Doing well on Tikosyn.  He has had intermittent shortness of breath with exertion and fatigue which was previous AF equivalent, but somewhat different from previous AF symptoms. He had never had palpitations with his atrial fibrillation. We have discussed monitoring options for atrial fibrillation including 30 day monitor vs ILR. Risks, benefits of ILR reviewed with patient and wife who wishes to proceed. Will plan for later today with Dr Rayann Heman. QTc stable today BMET, Mg today Continue Eliquis for CHADS2VASC of 4 Consider ablation depending on AF burden by ILR   2. Obesity Body mass index is 32.5 kg/m. Lifestyle modification was discussed at length including regular exercise and weight reduction.  3.  1st degree AV block Limits AAD options Consider PVI as above depending on AF burden by  ILR  Follow up with Dr Rayann Heman in 3 months as scheduled.   Chanetta Marshall, NP 04/21/2016 9:04 AM

## 2016-04-21 NOTE — Interval H&P Note (Signed)
History and Physical Interval Note:  04/21/2016 10:52 AM  Austin Taylor  has presented today for surgery, with the diagnosis of afib  The various methods of treatment have been discussed with the patient and family. After consideration of risks, benefits and other options for treatment, the patient has consented to  Procedure(s): Loop Recorder Insertion (N/A) as a surgical intervention .  The patient's history has been reviewed, patient examined, no change in status, stable for surgery.  I have reviewed the patient's chart and labs.  Questions were answered to the patient's satisfaction.     Austin Taylor  I have seen, examined the patient, and reviewed the above assessment and plan.  Changes to above are made where necessary.    Co Sign: Austin Grayer, MD 04/21/2016 10:52 AM

## 2016-04-21 NOTE — H&P (View-Only) (Signed)
Primary Care Physician: Vena Austria, MD Primary Cardiologist: Meda Coffee Primary Electrophysiologist: Zonia Kief is a 76 y.o. male with a history of persistent atrial fibrillation who presents for follow up in the [facility].  Since last being seen in clinic, the patient reports doing reasonably well.  He has had periods of shortness of breath with exertion that come and go.  He has never had palpitations with atrial fibrillation.  He in the past has had fatigue which he has also had intermittently.  He recently underwent colonoscopy and was told that he should be screened for OSA.  He has been set up for sleep study by PCP.  Today, he  denies symptoms of palpitations, chest pain, orthopnea, PND, lower extremity edema, dizziness, presyncope, syncope, bleeding, or neurologic sequela. The patient is tolerating medications without difficulties and is otherwise without complaint today.    Atrial Fibrillation Risk Factors:  he does have symptoms of sleep apnea. Sleep study pending - ordered by primary care  he does not have a history of rheumatic fever.  he does not have a history of alcohol use.  he has a BMI of Body mass index is 32.5 kg/m.Marland Kitchen Filed Weights   04/21/16 0837  Weight: 239 lb 9.6 oz (108.7 kg)    LA size: 24   Atrial Fibrillation Management history:  Previous antiarrhythmic drugs: Tikosyn  Previous cardioversions: 08/2015, 11/2015  Previous ablations: none  CHADS2VASC score: 4  Anticoagulation history: Eliquis   Past Medical History:  Diagnosis Date  . Chronic kidney disease    patient denies   . Hyperlipidemia   . Persistent atrial fibrillation ([place])   . Stroke (cerebrum) [facility])    Past Surgical History:  Procedure Laterality Date  . CARDIOVERSION N/A 09/09/2015   Procedure: CARDIOVERSION;  Surgeon: Fay Records, MD;  Location: [place];  Service: Cardiovascular;  Laterality: N/A;  . CARDIOVERSION N/A  11/20/2015   Procedure: CARDIOVERSION;  Surgeon: Pixie Casino, MD;  Location: [place];  Service: Cardiovascular;  Laterality: N/A;  . MOHS SURGERY    . ROTATOR CUFF REPAIR Left 1998    Current Outpatient Prescriptions  Medication Sig Dispense Refill  . diltiazem (CARDIZEM) 60 MG tablet Take 30-60 mg by mouth twice daily.  Take 30 mg twice daily. If you feel you are in AFIB take 60 mg twice daily 60 tablet 11  . dofetilide (TIKOSYN) 250 MCG capsule Take 1 capsule (250 mcg total) by mouth 2 (two) times daily. 60 capsule 11  . ELIQUIS 5 MG TABS tablet TAKE 1 TABLET BY MOUTH TWICE DAILY 60 tablet 5  . ferrous sulfate 325 (65 FE) MG tablet Take 325 mg by mouth 3 (three) times a week.     . furosemide (LASIX) 40 MG tablet Take 1 tablet (40 mg total) by mouth daily. 90 tablet 3  . sildenafil (REVATIO) 20 MG tablet Take 20 mg by mouth as needed (for ED). Take as directed    . simvastatin (ZOCOR) 40 MG tablet TAKE 1 TABLET BY MOUTH ONCE DAILY 30 tablet 10   No current facility-administered medications for this encounter.     Allergies  Allergen Reactions  . Codeine Nausea Only         Social History   Social History  . Marital status: Married    Spouse name: N/A  . Number of children: 2  . Years of education: 14   Occupational History  . Firefighter Other  Retired   Social History Main Topics  . Smoking status: Former Smoker    Quit date: 08/02/1974  . Smokeless tobacco: Never Used  . Alcohol use Yes     Comment: occassional  . Drug use: No  . Sexual activity: Not on file   Other Topics Concern  . Not on file   Social History Narrative   Patient is married with 2 children.   Patient is right handed.   Patient has 14 yrs of education.   Patient does not drink caffeine.    Family History  Problem Relation Age of Onset  . Heart disease Mother   . Heart disease Father   . Other Father     Carotid Artery Stenosis  . Heart disease Brother     ROS- All  systems are reviewed and negative except as per the HPI above.  Physical Exam: Vitals:   04/21/16 0837  BP: 118/74  Pulse: 63  Weight: 239 lb 9.6 oz (108.7 kg)  Height: 6' (1.829 m)    GEN- The patient is well appearing, alert and oriented x 3 today.   Head- normocephalic, atraumatic Eyes-  Sclera clear, conjunctiva pink Ears- hearing intact Oropharynx- clear Neck- supple  Lungs- Clear to ausculation bilaterally, normal work of breathing Heart- Regular rate and rhythm, no murmurs, rubs or gallops  GI- soft, NT, ND, + BS Extremities- no clubbing, cyanosis, or edema MS- no significant deformity or atrophy Skin- no rash or lesion Psych- euthymic mood, full affect Neuro- strength and sensation are intact  Wt Readings from Last 3 Encounters:  04/21/16 239 lb 9.6 oz (108.7 kg)  01/20/16 243 lb 3.2 oz (110.3 kg)  12/09/15 242 lb 3.2 oz (109.9 kg)    EKG today demonstrates sinus rhythm, 1st degree AV block, QTc 48msec Echo 10/2015 demonstrated EF Q000111Q, grade 2 diastolic dysfunction, LA 24  Epic records are reviewed at length today  Assessment and Plan:  1. Persistent atrial fibrillation Doing well on Tikosyn.  He has had intermittent shortness of breath with exertion and fatigue which was previous AF equivalent, but somewhat different from previous AF symptoms. He had never had palpitations with his atrial fibrillation. We have discussed monitoring options for atrial fibrillation including 30 day monitor vs ILR. Risks, benefits of ILR reviewed with patient and wife who wishes to proceed. Will plan for later today with Dr Rayann Heman. QTc stable today BMET, Mg today Continue Eliquis for CHADS2VASC of 4 Consider ablation depending on AF burden by ILR   2. Obesity Body mass index is 32.5 kg/m. Lifestyle modification was discussed at length including regular exercise and weight reduction.  3.  1st degree AV block Limits AAD options Consider PVI as above depending on AF burden by  ILR  Follow up with Dr Rayann Heman in 3 months as scheduled.   Chanetta Marshall, NP 04/21/2016 9:04 AM

## 2016-04-30 ENCOUNTER — Ambulatory Visit (INDEPENDENT_AMBULATORY_CARE_PROVIDER_SITE_OTHER): Payer: Medicare Other | Admitting: *Deleted

## 2016-04-30 DIAGNOSIS — I481 Persistent atrial fibrillation: Secondary | ICD-10-CM

## 2016-04-30 DIAGNOSIS — I4819 Other persistent atrial fibrillation: Secondary | ICD-10-CM

## 2016-04-30 LAB — CUP PACEART INCLINIC DEVICE CHECK
Date Time Interrogation Session: 20180215124000
MDC IDC PG IMPLANT DT: 20180206

## 2016-04-30 NOTE — Progress Notes (Signed)
Wound Loop check in clinic. Steri-Strips removed, wound well-healed, edges approximated. No redness or edema. Battery status: Good. R-waves 0.41mV. 0 symptom episodes, 0 tachy episodes, 0 pause episodes, 0 brady episodes. 70 AF episodes 36.1%+ Eliquis & Tikosyn 36.1% burden. Monthly summary reports and ROV with JA 07/20/2016

## 2016-05-01 ENCOUNTER — Telehealth: Payer: Self-pay

## 2016-05-01 NOTE — Telephone Encounter (Signed)
Spoke with pt to make apt with Dr. Rayann Heman, pt stated that he was out of town April 26-March 2, pt agreeable to apt on March 5 at 10:15am.

## 2016-05-04 ENCOUNTER — Encounter: Payer: Self-pay | Admitting: Physician Assistant

## 2016-05-05 ENCOUNTER — Encounter: Payer: Self-pay | Admitting: Physician Assistant

## 2016-05-05 ENCOUNTER — Ambulatory Visit (INDEPENDENT_AMBULATORY_CARE_PROVIDER_SITE_OTHER): Payer: Medicare Other | Admitting: Physician Assistant

## 2016-05-05 VITALS — BP 130/68 | HR 72 | Ht 72.0 in | Wt 243.8 lb

## 2016-05-05 DIAGNOSIS — E669 Obesity, unspecified: Secondary | ICD-10-CM | POA: Diagnosis not present

## 2016-05-05 DIAGNOSIS — I5032 Chronic diastolic (congestive) heart failure: Secondary | ICD-10-CM

## 2016-05-05 DIAGNOSIS — I480 Paroxysmal atrial fibrillation: Secondary | ICD-10-CM | POA: Diagnosis not present

## 2016-05-05 DIAGNOSIS — I10 Essential (primary) hypertension: Secondary | ICD-10-CM

## 2016-05-05 NOTE — Progress Notes (Signed)
Cardiology Office Note    Date:  05/05/2016   ID:  Austin Taylor, DOB 1940-10-09, MRN [id number]  PCP:  Vena Austria, MD  Cardiologist: Dr. Meda Coffee EPS: Dr. Rayann Heman  Chief Complaint  Patient presents with  . Follow-up    History of Present Illness:  Austin Taylor is a 76 y.o. male with history of Prior CVA, HLD, PAF DCCV 08/2015, 11/2015,  on Tikosyn, CHADS2VASC=4 on Eliquis.  Patient was having intermittent shortness of breath with exertion and fatigue which was previous A. fib equivalent. He has never had palpitations with his atrial fibrillation.Patient had a loop recorder 04/21/16.  Patient comes in today for 6 month follow-up from seeing Dr. Meda Coffee. He has no new complaints. Denies chest pain, palpitations, dyspnea, dyspnea on exertion, dizziness or presyncope. He is scheduled for a sleep apnea test with [facility].    Past Medical History:  Diagnosis Date  . Chronic kidney disease    patient denies   . Hyperlipidemia   . Persistent atrial fibrillation ([place])   . Stroke (cerebrum) [facility])     Past Surgical History:  Procedure Laterality Date  . CARDIOVERSION N/A 09/09/2015   Procedure: CARDIOVERSION;  Surgeon: Fay Records, MD;  Location: [place];  Service: Cardiovascular;  Laterality: N/A;  . CARDIOVERSION N/A 11/20/2015   Procedure: CARDIOVERSION;  Surgeon: Pixie Casino, MD;  Location: [facility] ENDOSCOPY;  Service: Cardiovascular;  Laterality: N/A;  . LOOP RECORDER INSERTION N/A 04/21/2016   Procedure: Loop Recorder Insertion;  Surgeon: [name], MD;  Location: [place] CV LAB;  Service: Cardiovascular;  Laterality: N/A;  . MOHS SURGERY    . ROTATOR CUFF REPAIR Left 1998    Current Medications: Outpatient Medications Prior to Visit  Medication Sig Dispense Refill  . diltiazem (CARDIZEM) 60 MG tablet Take 30-60 mg by mouth twice daily.  Take 30 mg twice daily. If you feel you are in AFIB take 60 mg twice daily 60 tablet 11  . dofetilide (TIKOSYN) 250 MCG  capsule Take 1 capsule (250 mcg total) by mouth 2 (two) times daily. 60 capsule 11  . ELIQUIS 5 MG TABS tablet TAKE 1 TABLET BY MOUTH TWICE DAILY 60 tablet 5  . ferrous sulfate 325 (65 FE) MG tablet Take 325 mg by mouth 3 (three) times a week.     . furosemide (LASIX) 40 MG tablet Take 1 tablet (40 mg total) by mouth daily. 90 tablet 3  . sildenafil (REVATIO) 20 MG tablet Take 20 mg by mouth as needed (for ED). Take as directed    . simvastatin (ZOCOR) 40 MG tablet TAKE 1 TABLET BY MOUTH ONCE DAILY 30 tablet 10   No facility-administered medications prior to visit.      Allergies:   Codeine   Social History   Social History  . Marital status: Married    Spouse name: N/A  . Number of children: 2  . Years of education: 14   Occupational History  . Firefighter Other    Retired   Social History Main Topics  . Smoking status: Former Smoker    Quit date: 08/02/1974  . Smokeless tobacco: Never Used  . Alcohol use Yes     Comment: occassional  . Drug use: No  . Sexual activity: Not Asked   Other Topics Concern  . None   Social History Narrative   Patient is married with 2 children.   Patient is right handed.   Patient has 14 yrs of education.   Patient  does not drink caffeine.     Family History:  The patient's family history includes Heart disease in his brother, father, and mother; Other in his father.   ROS:   Please see the history of present illness.    Review of Systems  Constitution: Positive for malaise/fatigue.  HENT: Negative.   Cardiovascular: Negative.   Respiratory: Positive for sleep disturbances due to breathing.   Endocrine: Negative.   Hematologic/Lymphatic: Negative.   Musculoskeletal: Negative.   Gastrointestinal: Negative.   Genitourinary: Negative.   Neurological: Negative.    All other systems reviewed and are negative.   PHYSICAL EXAM:   VS:  BP 130/68   Pulse 72   Ht 6' (1.829 m)   Wt 243 lb 12.8 oz (110.6 kg)   BMI 33.07 kg/m     Physical Exam  GEN: Obese, in no acute distress  Neck: no JVD, carotid bruits, or masses Cardiac:RRR; no murmurs, rubs, or gallops  Respiratory:  clear to auscultation bilaterally, normal work of breathing GI: soft, nontender, nondistended, + BS Ext: without cyanosis, clubbing, or edema, Good distal pulses bilaterally Psych: euthymic mood, full affect  Wt Readings from Last 3 Encounters:  05/05/16 243 lb 12.8 oz (110.6 kg)  04/21/16 239 lb (108.4 kg)  04/21/16 239 lb 9.6 oz (108.7 kg)      Studies/Labs Reviewed:   EKG:  EKG is not ordered today.   Recent Labs: 05/28/2015: ALT 19 11/19/2015: Hemoglobin 14.6; Platelets 217 04/21/2016: BUN 12; Creatinine, Ser 1.07; Magnesium 2.1; Potassium 4.1; Sodium 139   Lipid Panel    Component Value Date/Time   CHOL 140 05/28/2015 0754   TRIG 95 05/28/2015 0754   HDL 44 05/28/2015 0754   CHOLHDL 3.2 05/28/2015 0754   VLDL 19 05/28/2015 0754   LDLCALC 77 05/28/2015 0754    Additional studies/ records that were reviewed today include:   2-D echo 10/2015 Study Conclusions   - Left ventricle: The cavity size was normal. Systolic function was   vigorous. The estimated ejection fraction was in the range of 65%   to 70%. Wall motion was normal; there were no regional wall   motion abnormalities. Features are consistent with a pseudonormal   left ventricular filling pattern, with concomitant abnormal   relaxation and increased filling pressure (grade 2 diastolic   dysfunction). Doppler parameters are consistent with elevated   ventricular end-diastolic filling pressure. - Aortic valve: Trileaflet; normal thickness leaflets. There was no   regurgitation. - Aortic root: The aortic root was normal in size. - Ascending aorta: The ascending aorta was normal in size. - Mitral valve: Structurally normal valve. There was no   regurgitation. - Left atrium: The atrium was mildly dilated. - Right ventricle: The cavity size was normal. Wall thickness  was   normal. Systolic function was normal. - Right atrium: The atrium was normal in size. - Tricuspid valve: There was mild regurgitation. - Pulmonary arteries: Systolic pressure was within the normal   range. PA peak pressure: 33 mm Hg (S). - Inferior vena cava: The vessel was normal in size. - Pericardium, extracardiac: A trivial pericardial effusion was   identified posterior to the heart.   Impressions:   - There is no significant change form the study on 12/29/2013.     Echocardiogram - 12/28/2013 Left ventricle: The cavity size was normal. There was mild   concentric hypertrophy. Systolic function was normal. The   estimated ejection fraction was in the range of 60% to 65%. Wall  motion was normal; there were no regional wall motion   abnormalities. Features are consistent with a pseudonormal left   ventricular filling pattern, with concomitant abnormal relaxation   and increased filling pressure (grade 2 diastolic dysfunction).   There was no evidence of elevated ventricular filling pressure by   Doppler parameters. - Aortic valve: There was no regurgitation. - Aortic root: The aortic root was normal in size. - Mitral valve: There was mild regurgitation. - Left atrium: The atrium was mildly dilated. - Right ventricle: Systolic function was normal. - Right atrium: The atrium was normal in size. - Tricuspid valve: There was mild regurgitation. - Pulmonary arteries: Systolic pressure was within the normal   range. - Inferior vena cava: The vessel was normal in size. - Pericardium, extracardiac: There was no pericardial effusion.  Impressions:  - Normal biventricular size and systolic function. Pseudonormal   pattern of diastolic dysfunction with normal filling pressures.   No significant valvular abnormality.  ECG - Atrial fibrillation with rate 65 BPM< nonspecific ST-T wave abnormalities., previously sinus bradycardia, first-degree AV block, otherwise normal.     Doppler; 1-39% ICA stenosis. Vertebral artery flow is antegrade.            ASSESSMENT:    1. Paroxysmal atrial fibrillation (HCC)   2. Chronic diastolic CHF (congestive heart failure) ([place])   3. Essential hypertension   4. Obesity (BMI 30-39.9)      PLAN:  In order of problems listed above:  PAF on Tikosyn and Eliquis. Now with Loop recorder. F/u with Dr. Rayann Heman next month.  Chronic diastolic CHF compensated  Essential HTN controlled  Obesity weight loss recommended. For sleep study.    Medication Adjustments/Labs and Tests Ordered: Current medicines are reviewed at length with the patient today.  Concerns regarding medicines are outlined above.  Medication changes, Labs and Tests ordered today are listed in the Patient Instructions below. Patient Instructions  Your physician recommends that you continue on your current medications as directed. Please refer to the Current Medication list given to you today.  Your physician recommends that you schedule a follow-up appointment in:  AS  SCHEDULED     Signed, Ermalinda Barrios, PA-C  05/05/2016 2:30 PM    [address], [place], [place]  [address] Phone: [phone]; Fax: [phone]

## 2016-05-05 NOTE — Patient Instructions (Signed)
Your physician recommends that you continue on your current medications as directed. Please refer to the Current Medication list given to you today.   Your physician recommends that you schedule a follow-up appointment in: AS SCHEDULED  

## 2016-05-18 ENCOUNTER — Other Ambulatory Visit: Payer: Self-pay | Admitting: Cardiology

## 2016-05-18 ENCOUNTER — Ambulatory Visit (INDEPENDENT_AMBULATORY_CARE_PROVIDER_SITE_OTHER): Payer: Medicare Other | Admitting: Internal Medicine

## 2016-05-18 ENCOUNTER — Encounter: Payer: Self-pay | Admitting: Internal Medicine

## 2016-05-18 VITALS — BP 130/72 | HR 68 | Ht 72.0 in | Wt 243.1 lb

## 2016-05-18 DIAGNOSIS — E669 Obesity, unspecified: Secondary | ICD-10-CM | POA: Diagnosis not present

## 2016-05-18 DIAGNOSIS — I440 Atrioventricular block, first degree: Secondary | ICD-10-CM

## 2016-05-18 DIAGNOSIS — E785 Hyperlipidemia, unspecified: Secondary | ICD-10-CM

## 2016-05-18 DIAGNOSIS — I5033 Acute on chronic diastolic (congestive) heart failure: Secondary | ICD-10-CM

## 2016-05-18 DIAGNOSIS — I10 Essential (primary) hypertension: Secondary | ICD-10-CM | POA: Diagnosis not present

## 2016-05-18 DIAGNOSIS — I480 Paroxysmal atrial fibrillation: Secondary | ICD-10-CM

## 2016-05-18 LAB — CUP PACEART INCLINIC DEVICE CHECK
Date Time Interrogation Session: 20180305133958
Implantable Pulse Generator Implant Date: 20180206

## 2016-05-18 MED ORDER — DILTIAZEM HCL 60 MG PO TABS: ORAL_TABLET | ORAL | 11 refills | Status: DC

## 2016-05-18 NOTE — Progress Notes (Signed)
PCP: Austin Austria, Austin Taylor Primary Cardiologist:  Austin Taylor is a 76 y.o. male who presents today for routine electrophysiology followup.  Since his last office visit, the patient reports doing reasonably well.   His energy is good and he is active.  He has episodes of breathlessness which correspond to afib on recently placed ILR.  Episodes mostly occur in the early AM.  Today, he denies symptoms of palpitations, chest pain,  lower extremity edema, dizziness, presyncope, or syncope.  The patient is otherwise without complaint today.   Past Medical History:  Diagnosis Date  . Chronic kidney disease    patient denies   . Hyperlipidemia   . Persistent atrial fibrillation ([place])   . Stroke (cerebrum) [facility])    Past Surgical History:  Procedure Laterality Date  . CARDIOVERSION N/A 09/09/2015   Procedure: CARDIOVERSION;  Surgeon: Fay Records, Austin Taylor;  Location: [place];  Service: Cardiovascular;  Laterality: N/A;  . CARDIOVERSION N/A 11/20/2015   Procedure: CARDIOVERSION;  Surgeon: Pixie Casino, Austin Taylor;  Location: [facility] ENDOSCOPY;  Service: Cardiovascular;  Laterality: N/A;  . LOOP RECORDER INSERTION N/A 04/21/2016   Procedure: Loop Recorder Insertion;  Surgeon: [name], Austin Taylor;  Location: [place] CV LAB;  Service: Cardiovascular;  Laterality: N/A;  . MOHS SURGERY    . ROTATOR CUFF REPAIR Left 1998    ROS- all systems are reviewed and negatives except as per HPI above  Current Outpatient Prescriptions  Medication Sig Dispense Refill  . diltiazem (CARDIZEM) 60 MG tablet Take 30-60 mg by mouth twice daily.  Take 30 mg twice daily. If you feel you are in AFIB take 60 mg twice daily 60 tablet 11  . dofetilide (TIKOSYN) 250 MCG capsule Take 1 capsule (250 mcg total) by mouth 2 (two) times daily. 60 capsule 11  . ELIQUIS 5 MG TABS tablet TAKE 1 TABLET BY MOUTH TWICE DAILY 60 tablet 5  . ferrous sulfate 325 (65 FE) MG tablet Take 325 mg by mouth 3 (three) times a week. M,  W, F    . furosemide (LASIX) 40 MG tablet Take 1 tablet (40 mg total) by mouth daily. 90 tablet 3  . sildenafil (REVATIO) 20 MG tablet Take 20 mg by mouth as needed (for ED). Take as directed    . simvastatin (ZOCOR) 40 MG tablet TAKE 1 TABLET BY MOUTH ONCE DAILY 30 tablet 10   No current facility-administered medications for this visit.     Physical Exam: Vitals:   05/18/16 1016  BP: 130/72  Pulse: 68  SpO2: 94%  Weight: 243 lb 2 oz (110.3 kg)  Height: 6' (1.829 m)    GEN- The patient is well appearing, alert and oriented x 3 today.   Head- normocephalic, atraumatic Eyes-  Sclera clear, conjunctiva pink Ears- hearing intact Oropharynx- clear Lungs- Clear to ausculation bilaterally, normal work of breathing Heart- Regular rate and rhythm, no murmurs, rubs or gallops, PMI not laterally displaced GI- soft, NT, ND, + BS Extremities- no clubbing, cyanosis, or edema  ekg today reveals sinus rhythm 68 bpm, PR 394 msec, Qtc 461 msec  Assessment and Plan:  1. Persistent afib Now in sinus on tikosyn,  AF burden by ILR is 26% Most episodes are early am. Therapeutic strategies for afib including medicine and ablation were discussed in detail with the patient today. Risk, benefits, and alternatives to EP study and radiofrequency ablation for afib were also discussed in detail today.  As afib episodes are  early am, he would prefer to reassess after sleep study.  If he has OSA, he is optimistic that episodes will improve with appropriate management.  If his afib persists, he may be more willing to consider ablation then.   2. First degree AV block Limits our medical options for afib. I worry that with advancing AV delay that he is at risk for AF block No AV block seen on ILR review today Stop diltiazem,  Take diltiazem on prn  3. obesity Body mass index is 32.97 kg/m.  Weight loss and regular exercise advised  4. Snoring He has an appointment with Austin Maxwell Caul later this  week  Return to see EP NP in 2 months  [name] Austin Taylor, [facility] 05/18/2016 10:44 AM

## 2016-05-18 NOTE — Patient Instructions (Signed)
Medication Instructions:  Your physician has recommended you make the following change in your medication:  1) Only take Diltiazem as needed   Labwork: None ordered   Testing/Procedures: None ordered   Follow-Up: Your physician recommends that you schedule a follow-up appointment in: 2 months with Chanetta Marshall, NP   Any Other Special Instructions Will Be Listed Below (If Applicable).     If you need a refill on your cardiac medications before your next appointment, please call your pharmacy.

## 2016-05-21 ENCOUNTER — Ambulatory Visit (INDEPENDENT_AMBULATORY_CARE_PROVIDER_SITE_OTHER): Payer: Medicare Other | Admitting: *Deleted

## 2016-05-21 ENCOUNTER — Telehealth: Payer: Self-pay | Admitting: *Deleted

## 2016-05-21 DIAGNOSIS — I480 Paroxysmal atrial fibrillation: Secondary | ICD-10-CM

## 2016-05-21 NOTE — Telephone Encounter (Signed)
[facility] requesting call back regarding canceling appointment with Dr. Rayann Heman scheduled for 07/20/16.  Patient saw Dr. Rayann Heman on 05/18/16 and it was recommended that he follow-up with Chanetta Marshall, NP on 07/29/16 (2 month f/u).

## 2016-05-21 NOTE — Telephone Encounter (Signed)
Patient returned call.  He agrees to cancel appointment with Dr. Rayann Heman.  Patient is appreciative of call and denies additional questions or concerns at this time.

## 2016-06-10 LAB — CUP PACEART REMOTE DEVICE CHECK
Date Time Interrogation Session: 20180308183828
Implantable Pulse Generator Implant Date: 20180206

## 2016-06-10 NOTE — Progress Notes (Signed)
Carelink Summary Report/loop recorder

## 2016-06-22 ENCOUNTER — Ambulatory Visit (INDEPENDENT_AMBULATORY_CARE_PROVIDER_SITE_OTHER): Payer: Medicare Other | Admitting: *Deleted

## 2016-06-22 DIAGNOSIS — I480 Paroxysmal atrial fibrillation: Secondary | ICD-10-CM

## 2016-06-22 NOTE — Progress Notes (Signed)
Carelink Summary Report / Loop Recorder 

## 2016-06-30 LAB — CUP PACEART REMOTE DEVICE CHECK
Implantable Pulse Generator Implant Date: 20180206
MDC IDC SESS DTM: 20180407193857

## 2016-06-30 NOTE — Progress Notes (Signed)
Carelink summary report received. Battery status OK. Normal device function. No new symptom episodes, tachy episodes, brady, or pause episodes. 4 AF 37.8% +Eliquis +Tikosyn.  Monthly summary reports and ROV/PRN

## 2016-07-20 ENCOUNTER — Ambulatory Visit (INDEPENDENT_AMBULATORY_CARE_PROVIDER_SITE_OTHER): Payer: Medicare Other | Admitting: *Deleted

## 2016-07-20 ENCOUNTER — Encounter: Payer: Medicare Other | Admitting: Internal Medicine

## 2016-07-20 DIAGNOSIS — I480 Paroxysmal atrial fibrillation: Secondary | ICD-10-CM | POA: Diagnosis not present

## 2016-07-21 NOTE — Progress Notes (Signed)
Carelink Summary Report / Loop Recorder 

## 2016-07-27 NOTE — Progress Notes (Signed)
Electrophysiology Office Note Date: 07/29/2016  ID:  Austin Taylor, DOB 1940-03-21, MRN [id number]  PCP: Maury Dus, MD Primary Cardiologist: Meda Coffee Electrophysiologist: All[place]  CC: AF follow up  Austin Taylor is a 76 y.o. male seen today for Dr Rayann Heman.  He presents today for routine electrophysiology followup.  Since last being seen in our clinic, the patient reports doing relatively well.  He has not noticed significant change since stopping scheduled Diltiazem and has not taken any prn.  Sleep study with Dr Maxwell Caul demonstrated OSA and he has been compliant with CPAP for at least the last 2 weeks.  He feels that AF burden has not changed.  He continues with intermittent shortness of breath that is consistent with prior symptoms of AF. He recently also tripped over his shoe (he is clear he did not have syncope) and broke his toe for which he is currently in a boot.  There is no surgery planned and the boot will come off in the next week or so.  He denies chest pain, palpitations, PND, orthopnea, nausea, vomiting, dizziness, syncope, edema, weight gain, or early satiety.  Past Medical History:  Diagnosis Date  . Chronic kidney disease    patient denies   . Hyperlipidemia   . Persistent atrial fibrillation ([place])   . Stroke (cerebrum) [facility])    Past Surgical History:  Procedure Laterality Date  . CARDIOVERSION N/A 09/09/2015   Procedure: CARDIOVERSION;  Surgeon: Fay Records, MD;  Location: [place];  Service: Cardiovascular;  Laterality: N/A;  . CARDIOVERSION N/A 11/20/2015   Procedure: CARDIOVERSION;  Surgeon: Pixie Casino, MD;  Location: [facility] ENDOSCOPY;  Service: Cardiovascular;  Laterality: N/A;  . LOOP RECORDER INSERTION N/A 04/21/2016   Procedure: Loop Recorder Insertion;  Surgeon: [name], MD;  Location: [place] CV LAB;  Service: Cardiovascular;  Laterality: N/A;  . MOHS SURGERY    . ROTATOR CUFF REPAIR Left 1998    Current Outpatient Prescriptions    Medication Sig Dispense Refill  . diltiazem (CARDIZEM) 60 MG tablet Take 1-2 tablets by mouth every 6 hours as needed for AFIB 60 tablet 11  . dofetilide (TIKOSYN) 250 MCG capsule Take 1 capsule (250 mcg total) by mouth 2 (two) times daily. 60 capsule 11  . ELIQUIS 5 MG TABS tablet TAKE 1 TABLET BY MOUTH TWICE DAILY 60 tablet 5  . ferrous sulfate 325 (65 FE) MG tablet Take 325 mg by mouth 3 (three) times a week. M, W, F    . furosemide (LASIX) 40 MG tablet TAKE 1 TABLET BY MOUTH DAILY 90 tablet 3  . sildenafil (REVATIO) 20 MG tablet Take 20 mg by mouth as needed (for ED). Take as directed    . simvastatin (ZOCOR) 40 MG tablet TAKE 1 TABLET BY MOUTH ONCE DAILY 30 tablet 10   No current facility-administered medications for this visit.     Allergies:   Codeine   Social History: Social History   Social History  . Marital status: Married    Spouse name: N/A  . Number of children: 2  . Years of education: 14   Occupational History  . Firefighter Other    Retired   Social History Main Topics  . Smoking status: Former Smoker    Quit date: 08/02/1974  . Smokeless tobacco: Never Used  . Alcohol use Yes     Comment: occassional  . Drug use: No  . Sexual activity: Not on file   Other Topics Concern  .  Not on file   Social History Narrative   Patient is married with 2 children.   Patient is right handed.   Patient has 14 yrs of education.   Patient does not drink caffeine.    Family History: Family History  Problem Relation Age of Onset  . Heart disease Mother   . Heart disease Father   . Other Father        Carotid Artery Stenosis  . Heart disease Brother     Review of Systems: All other systems reviewed and are otherwise negative except as noted above.   Physical Exam: VS:  BP 126/82   Pulse 60   Ht 6' (1.829 m)   Wt 242 lb 1.6 oz (109.8 kg)   SpO2 94%   BMI 32.83 kg/m  , BMI Body mass index is 32.83 kg/m. Wt Readings from Last 3 Encounters:  07/29/16 242  lb 1.6 oz (109.8 kg)  05/18/16 243 lb 2 oz (110.3 kg)  05/05/16 243 lb 12.8 oz (110.6 kg)    GEN- The patient is elderly appearing, alert and oriented x 3 today.   HEENT: normocephalic, atraumatic; sclera clear, conjunctiva pink; hearing intact; oropharynx clear; neck supple Lungs- Clear to ausculation bilaterally, normal work of breathing.  No wheezes, rales, rhonchi Heart- Regular rate and rhythm  GI- soft, non-tender, non-distended, bowel sounds present  Extremities- no clubbing, cyanosis, or edema; +orthopedic boot on right foot MS- no significant deformity or atrophy Skin- warm and dry, no rash or lesion  Psych- euthymic mood, full affect Neuro- strength and sensation are intact   EKG:  EKG is ordered today. The ekg ordered today shows sinus rhythm, rate 60, 1st degree AV block (PR 22msec), QTc 458msec manually  Recent Labs: 11/19/2015: Hemoglobin 14.6; Platelets 217 04/21/2016: BUN 12; Creatinine, Ser 1.07; Magnesium 2.1; Potassium 4.1; Sodium 139    Other studies Reviewed: Additional studies/ records that were reviewed today include: Dr Jackalyn Lombard office notes  Assessment and Plan:  1.  Persistent atrial fibrillation Burden by ILR interrogation today 30% V rates controlled Continue Eliquis long term for CHADS2VASC of 4 He underwent sleep study with Dr Maxwell Caul which demonstrated OSA and he has been compliant with CPAP without significant change in AF burden or symptoms.  Continue Tikosyn. We have again discussed PVI as an option today. He would like to proceed with PVI. Risks, benefits previously discussed with patient by Dr Rayann Heman and also discussed by me again today.  We will schedule at next available time with Dr Rayann Heman. Plan TEE prior to procedure. We discussed importance of compliance with Eliquis with no missed doses today.   2.  1st degree AV block Stable No change required today Avoid AVN blocking agents    3.  Obesity Body mass index is 32.83 kg/m. Weight loss  recommended  4.  OSA Continued compliance with CPAP encouraged    Current medicines are reviewed at length with the patient today.   The patient does not have concerns regarding his medicines.  The following changes were made today:  none  Labs/ tests ordered today include: pre-procedure labs No orders of the defined types were placed in this encounter.    Disposition:   Follow up with Dr Rayann Heman following AF ablation      Signed, Chanetta Marshall, NP 07/29/2016 8:21 AM   [address] [place] [address] [phone] (office) [phone] (fax)

## 2016-07-29 ENCOUNTER — Encounter: Payer: Self-pay | Admitting: Nurse Practitioner

## 2016-07-29 ENCOUNTER — Ambulatory Visit (INDEPENDENT_AMBULATORY_CARE_PROVIDER_SITE_OTHER): Payer: Medicare Other | Admitting: Nurse Practitioner

## 2016-07-29 VITALS — BP 126/82 | HR 60 | Ht 72.0 in | Wt 242.1 lb

## 2016-07-29 DIAGNOSIS — G4733 Obstructive sleep apnea (adult) (pediatric): Secondary | ICD-10-CM | POA: Diagnosis not present

## 2016-07-29 DIAGNOSIS — I481 Persistent atrial fibrillation: Secondary | ICD-10-CM

## 2016-07-29 DIAGNOSIS — I440 Atrioventricular block, first degree: Secondary | ICD-10-CM

## 2016-07-29 DIAGNOSIS — I4819 Other persistent atrial fibrillation: Secondary | ICD-10-CM

## 2016-07-29 NOTE — Patient Instructions (Signed)
Medication Instructions:  Your physician recommends that you continue on your current medications as directed. Please refer to the Current Medication list given to you today.    Labwork: Your physician recommends that you return for lab work on 08/18/2016 at 8:00 AM. You do not have to fast. BMET, CBC  Testing/Procedures: Your physician has requested that you have a TEE. During a TEE, sound waves are used to create images of your heart. It provides your doctor with information about the size and shape of your heart and how well your heart's chambers and valves are working. In this test, a transducer is attached to the end of a flexible tube that's guided down your throat and into your esophagus (the tube leading from you mouth to your stomach) to get a more detailed image of your heart. You are not awake for the procedure. Please see the instruction sheet given to you today. For further information please visit [email or web address]   Your physician has recommended that you have an ablation. Catheter ablation is a medical procedure used to treat some cardiac arrhythmias (irregular heartbeats). During catheter ablation, a long, thin, flexible tube is put into a blood vessel in your groin (upper thigh), or neck. This tube is called an ablation catheter. It is then guided to your heart through the blood vessel. Radio frequency waves destroy small areas of heart tissue where abnormal heartbeats may cause an arrhythmia to start. Please see the instruction sheet given to you today.--08/25/16  Please arrive at the [facility] main enterance of [place] at 7:30 AM.  Do not eat or drink after midnight the night before your procedure.  Do not take any medicines the morning of your procedure. Plan for a one night stay. You will need someone to drive you home at discharge.  Follow-Up: Your physician recommends that you schedule a follow-up appointment in: 4 weeks from 08/25/16 with Roderic Palau,  NP and 3 months from 08/25/16 with Dr. Rayann Heman.     Any Other Special Instructions Will Be Listed Below (If Applicable).     If you need a refill on your cardiac medications before your next appointment, please call your pharmacy.  Thank you for choosing [facility]

## 2016-08-04 LAB — CUP PACEART REMOTE DEVICE CHECK
MDC IDC PG IMPLANT DT: 20180206
MDC IDC SESS DTM: 20180507193954

## 2016-08-18 ENCOUNTER — Other Ambulatory Visit: Payer: Medicare Other | Admitting: *Deleted

## 2016-08-18 DIAGNOSIS — I4819 Other persistent atrial fibrillation: Secondary | ICD-10-CM

## 2016-08-18 LAB — CBC
HEMOGLOBIN: 15 g/dL (ref 13.0–17.7)
Hematocrit: 43.2 % (ref 37.5–51.0)
MCH: 31.3 pg (ref 26.6–33.0)
MCHC: 34.7 g/dL (ref 31.5–35.7)
MCV: 90 fL (ref 79–97)
Platelets: 250 10*3/uL (ref 150–379)
RBC: 4.8 x10E6/uL (ref 4.14–5.80)
RDW: 13.7 % (ref 12.3–15.4)
WBC: 7.5 10*3/uL (ref 3.4–10.8)

## 2016-08-18 LAB — BASIC METABOLIC PANEL
BUN/Creatinine Ratio: 18 (ref 10–24)
BUN: 19 mg/dL (ref 8–27)
CALCIUM: 9.1 mg/dL (ref 8.6–10.2)
CO2: 26 mmol/L (ref 18–29)
CREATININE: 1.04 mg/dL (ref 0.76–1.27)
Chloride: 102 mmol/L (ref 96–106)
GFR calc Af Amer: 80 mL/min/{1.73_m2} (ref >59)
GFR, EST NON AFRICAN AMERICAN: 69 mL/min/{1.73_m2} (ref >59)
GLUCOSE: 107 mg/dL — AB (ref 65–99)
Potassium: 4.4 mmol/L (ref 3.5–5.2)
Sodium: 142 mmol/L (ref 134–144)

## 2016-08-19 ENCOUNTER — Ambulatory Visit (INDEPENDENT_AMBULATORY_CARE_PROVIDER_SITE_OTHER): Payer: Medicare Other | Admitting: *Deleted

## 2016-08-19 DIAGNOSIS — I4892 Unspecified atrial flutter: Secondary | ICD-10-CM | POA: Diagnosis not present

## 2016-08-21 NOTE — Progress Notes (Signed)
Carelink Summary Report / Loop Recorder 

## 2016-08-24 ENCOUNTER — Telehealth: Payer: Self-pay | Admitting: Internal Medicine

## 2016-08-24 LAB — CUP PACEART REMOTE DEVICE CHECK
Date Time Interrogation Session: 20180606194243
MDC IDC PG IMPLANT DT: 20180206

## 2016-08-24 NOTE — Progress Notes (Signed)
Carelink summary report received. Battery status OK. Normal device function. No new symptom episodes, tachy episodes, brady, or pause episodes. AF 30.9% +eliquis/tikosyn. Impending AF ablation. Monthly summary reports and ROV/PRN

## 2016-08-24 NOTE — Telephone Encounter (Signed)
New message    Pt is calling to find out if he needs to bring his CPAP machine for when he goes to the hospital to have his procedure.

## 2016-08-24 NOTE — Telephone Encounter (Signed)
Returned call to patient and let him know okay to bring his own machine if he wants.

## 2016-08-25 ENCOUNTER — Encounter (HOSPITAL_COMMUNITY)
Admission: RE | Disposition: A | Discharge status: Home / Self Care | Payer: Self-pay | Source: Ambulatory Visit | Attending: Internal Medicine

## 2016-08-25 ENCOUNTER — Ambulatory Visit (HOSPITAL_COMMUNITY): Payer: Medicare Other | Admitting: Certified Registered Nurse Anesthetist

## 2016-08-25 ENCOUNTER — Encounter (HOSPITAL_COMMUNITY): Payer: Self-pay

## 2016-08-25 ENCOUNTER — Ambulatory Visit (HOSPITAL_BASED_OUTPATIENT_CLINIC_OR_DEPARTMENT_OTHER)
Admission: RE | Admit: 2016-08-25 | Discharge: 2016-08-25 | Disposition: A | Discharge status: Home / Self Care | Payer: Medicare Other | Source: Ambulatory Visit | Attending: Nurse Practitioner | Admitting: Nurse Practitioner

## 2016-08-25 ENCOUNTER — Ambulatory Visit (HOSPITAL_COMMUNITY)
Admission: RE | Admit: 2016-08-25 | Discharge: 2016-08-26 | Disposition: A | Discharge status: Home / Self Care | Payer: Medicare Other | Source: Ambulatory Visit | Attending: Internal Medicine | Admitting: Internal Medicine

## 2016-08-25 DIAGNOSIS — E669 Obesity, unspecified: Secondary | ICD-10-CM | POA: Diagnosis not present

## 2016-08-25 DIAGNOSIS — Z79899 Other long term (current) drug therapy: Secondary | ICD-10-CM | POA: Diagnosis not present

## 2016-08-25 DIAGNOSIS — K219 Gastro-esophageal reflux disease without esophagitis: Secondary | ICD-10-CM | POA: Diagnosis not present

## 2016-08-25 DIAGNOSIS — Z885 Allergy status to narcotic agent status: Secondary | ICD-10-CM | POA: Diagnosis not present

## 2016-08-25 DIAGNOSIS — I481 Persistent atrial fibrillation: Secondary | ICD-10-CM | POA: Diagnosis not present

## 2016-08-25 DIAGNOSIS — Z8673 Personal history of transient ischemic attack (TIA), and cerebral infarction without residual deficits: Secondary | ICD-10-CM | POA: Diagnosis not present

## 2016-08-25 DIAGNOSIS — Z9989 Dependence on other enabling machines and devices: Secondary | ICD-10-CM | POA: Diagnosis not present

## 2016-08-25 DIAGNOSIS — I110 Hypertensive heart disease with heart failure: Secondary | ICD-10-CM | POA: Diagnosis not present

## 2016-08-25 DIAGNOSIS — I440 Atrioventricular block, first degree: Secondary | ICD-10-CM | POA: Insufficient documentation

## 2016-08-25 DIAGNOSIS — I4891 Unspecified atrial fibrillation: Secondary | ICD-10-CM

## 2016-08-25 DIAGNOSIS — I509 Heart failure, unspecified: Secondary | ICD-10-CM | POA: Diagnosis not present

## 2016-08-25 DIAGNOSIS — Z7901 Long term (current) use of anticoagulants: Secondary | ICD-10-CM | POA: Diagnosis not present

## 2016-08-25 DIAGNOSIS — Z8249 Family history of ischemic heart disease and other diseases of the circulatory system: Secondary | ICD-10-CM | POA: Diagnosis not present

## 2016-08-25 DIAGNOSIS — Z87891 Personal history of nicotine dependence: Secondary | ICD-10-CM | POA: Insufficient documentation

## 2016-08-25 DIAGNOSIS — I480 Paroxysmal atrial fibrillation: Secondary | ICD-10-CM | POA: Diagnosis not present

## 2016-08-25 DIAGNOSIS — I482 Chronic atrial fibrillation, unspecified: Secondary | ICD-10-CM | POA: Diagnosis present

## 2016-08-25 DIAGNOSIS — Z6832 Body mass index (BMI) 32.0-32.9, adult: Secondary | ICD-10-CM | POA: Insufficient documentation

## 2016-08-25 DIAGNOSIS — G4733 Obstructive sleep apnea (adult) (pediatric): Secondary | ICD-10-CM | POA: Insufficient documentation

## 2016-08-25 DIAGNOSIS — E785 Hyperlipidemia, unspecified: Secondary | ICD-10-CM | POA: Insufficient documentation

## 2016-08-25 HISTORY — PX: ATRIAL FIBRILLATION ABLATION: EP1191

## 2016-08-25 HISTORY — DX: Personal history of other diseases of the digestive system: Z87.19

## 2016-08-25 HISTORY — DX: Dependence on other enabling machines and devices: Z99.89

## 2016-08-25 HISTORY — DX: Unspecified malignant neoplasm of skin of unspecified ear and external auricular canal: C44.201

## 2016-08-25 HISTORY — PX: TEE WITHOUT CARDIOVERSION: SHX5443

## 2016-08-25 HISTORY — DX: Obstructive sleep apnea (adult) (pediatric): G47.33

## 2016-08-25 LAB — POCT ACTIVATED CLOTTING TIME
ACTIVATED CLOTTING TIME: 296 s
Activated Clotting Time: 235 seconds
Activated Clotting Time: 274 seconds
Activated Clotting Time: 186 seconds

## 2016-08-25 SURGERY — ECHOCARDIOGRAM, TRANSESOPHAGEAL
Anesthesia: Moderate Sedation

## 2016-08-25 SURGERY — ATRIAL FIBRILLATION ABLATION
Anesthesia: General

## 2016-08-25 MED ORDER — SODIUM CHLORIDE 0.9 % IV SOLN: INTRAVENOUS | Status: DC

## 2016-08-25 MED ORDER — ISOPROTERENOL HCL 0.2 MG/ML IJ SOLN
INTRAVENOUS | Status: DC | PRN
  Administered 2016-08-25: 20 ug/min via INTRAVENOUS

## 2016-08-25 MED ORDER — PROTAMINE SULFATE 10 MG/ML IV SOLN
INTRAVENOUS | Status: DC | PRN
Start: 2016-08-25 — End: 2016-08-25
  Administered 2016-08-25: 30 mg via INTRAVENOUS

## 2016-08-25 MED ORDER — SODIUM CHLORIDE 0.9% FLUSH: 3.0000 mL | INTRAVENOUS | Status: DC | PRN

## 2016-08-25 MED ORDER — SODIUM CHLORIDE 0.9% FLUSH
3.0000 mL | Freq: Two times a day (BID) | INTRAVENOUS | Status: DC
  Administered 2016-08-26: 3 mL via INTRAVENOUS

## 2016-08-25 MED ORDER — BUPIVACAINE HCL (PF) 0.25 % IJ SOLN
INTRAMUSCULAR | Status: DC | PRN
  Administered 2016-08-25: 20 mL

## 2016-08-25 MED ORDER — DOFETILIDE 250 MCG PO CAPS
250.0000 ug | ORAL_CAPSULE | Freq: Two times a day (BID) | ORAL | Status: DC
  Administered 2016-08-25 – 2016-08-26 (×2): 250 ug via ORAL
  Filled 2016-08-25 (×2): qty 1

## 2016-08-25 MED ORDER — FENTANYL CITRATE (PF) 100 MCG/2ML IJ SOLN
INTRAMUSCULAR | Status: AC
  Filled 2016-08-25: qty 2

## 2016-08-25 MED ORDER — HYDROCODONE-ACETAMINOPHEN 5-325 MG PO TABS: 1.0000 | ORAL_TABLET | ORAL | Status: DC | PRN

## 2016-08-25 MED ORDER — ISOPROTERENOL HCL 0.2 MG/ML IJ SOLN
INTRAMUSCULAR | Status: AC
Start: 2016-08-25 — End: 2016-08-25
  Filled 2016-08-25: qty 5

## 2016-08-25 MED ORDER — SODIUM CHLORIDE 0.9 % IV SOLN
INTRAVENOUS | Status: DC | PRN
  Administered 2016-08-25 (×3): via INTRAVENOUS

## 2016-08-25 MED ORDER — LIDOCAINE HCL (CARDIAC) 20 MG/ML IV SOLN
INTRAVENOUS | Status: DC | PRN
  Administered 2016-08-25: 80 mg via INTRAVENOUS

## 2016-08-25 MED ORDER — HEPARIN SODIUM (PORCINE) 1000 UNIT/ML IJ SOLN
INTRAMUSCULAR | Status: DC | PRN
  Administered 2016-08-25: 1000 [IU] via INTRAVENOUS
  Administered 2016-08-25: 12000 [IU] via INTRAVENOUS
  Administered 2016-08-25: 1000 [IU] via INTRAVENOUS

## 2016-08-25 MED ORDER — BUTAMBEN-TETRACAINE-BENZOCAINE 2-2-14 % EX AERO
INHALATION_SPRAY | CUTANEOUS | Status: DC | PRN
  Administered 2016-08-25: 2 via TOPICAL

## 2016-08-25 MED ORDER — IOPAMIDOL (ISOVUE-370) INJECTION 76%
INTRAVENOUS | Status: DC | PRN
  Administered 2016-08-25: 3 mL via INTRAVENOUS

## 2016-08-25 MED ORDER — MIDAZOLAM HCL 5 MG/ML IJ SOLN
INTRAMUSCULAR | Status: AC
  Filled 2016-08-25: qty 2

## 2016-08-25 MED ORDER — IOPAMIDOL (ISOVUE-370) INJECTION 76%
INTRAVENOUS | Status: AC
  Filled 2016-08-25: qty 50

## 2016-08-25 MED ORDER — MIDAZOLAM HCL 10 MG/2ML IJ SOLN
INTRAMUSCULAR | Status: DC | PRN
  Administered 2016-08-25: 1 mg via INTRAVENOUS
  Administered 2016-08-25 (×2): 2 mg via INTRAVENOUS

## 2016-08-25 MED ORDER — ACETAMINOPHEN 325 MG PO TABS
650.0000 mg | ORAL_TABLET | ORAL | Status: DC | PRN
  Administered 2016-08-26: 650 mg via ORAL
  Filled 2016-08-25: qty 2

## 2016-08-25 MED ORDER — BUPIVACAINE HCL (PF) 0.25 % IJ SOLN
INTRAMUSCULAR | Status: AC
  Filled 2016-08-25: qty 30

## 2016-08-25 MED ORDER — ONDANSETRON HCL 4 MG/2ML IJ SOLN: 4.0000 mg | Freq: Four times a day (QID) | INTRAMUSCULAR | Status: DC | PRN

## 2016-08-25 MED ORDER — HEPARIN SODIUM (PORCINE) 1000 UNIT/ML IJ SOLN
INTRAMUSCULAR | Status: DC | PRN
  Administered 2016-08-25: 5000 [IU] via INTRAVENOUS
  Administered 2016-08-25: 2000 [IU] via INTRAVENOUS
  Administered 2016-08-25: 4000 [IU] via INTRAVENOUS

## 2016-08-25 MED ORDER — SODIUM CHLORIDE 0.9 % IV SOLN: 250.0000 mL | INTRAVENOUS | Status: DC | PRN

## 2016-08-25 MED ORDER — FENTANYL CITRATE (PF) 100 MCG/2ML IJ SOLN
INTRAMUSCULAR | Status: DC | PRN
Start: 2016-08-25 — End: 2016-08-25
  Administered 2016-08-25 (×2): 25 ug via INTRAVENOUS

## 2016-08-25 MED ORDER — FENTANYL CITRATE (PF) 100 MCG/2ML IJ SOLN
INTRAMUSCULAR | Status: DC | PRN
  Administered 2016-08-25 (×4): 25 ug via INTRAVENOUS
  Administered 2016-08-25: 50 ug via INTRAVENOUS
  Administered 2016-08-25 (×2): 25 ug via INTRAVENOUS

## 2016-08-25 MED ORDER — ONDANSETRON HCL 4 MG/2ML IJ SOLN
INTRAMUSCULAR | Status: DC | PRN
  Administered 2016-08-25: 4 mg via INTRAVENOUS

## 2016-08-25 MED ORDER — HEPARIN SODIUM (PORCINE) 1000 UNIT/ML IJ SOLN
INTRAMUSCULAR | Status: AC
  Filled 2016-08-25: qty 1

## 2016-08-25 MED ORDER — PROPOFOL 10 MG/ML IV BOLUS
INTRAVENOUS | Status: DC | PRN
  Administered 2016-08-25: 150 mg via INTRAVENOUS

## 2016-08-25 MED ORDER — FUROSEMIDE 40 MG PO TABS
40.0000 mg | ORAL_TABLET | Freq: Every day | ORAL | Status: DC
  Administered 2016-08-26: 40 mg via ORAL
  Filled 2016-08-25: qty 1

## 2016-08-25 MED ORDER — APIXABAN 5 MG PO TABS
5.0000 mg | ORAL_TABLET | Freq: Two times a day (BID) | ORAL | Status: DC
  Administered 2016-08-25 – 2016-08-26 (×2): 5 mg via ORAL
  Filled 2016-08-25 (×2): qty 1

## 2016-08-25 SURGICAL SUPPLY — 19 items
BAG SNAP BAND KOVER 36X36 (MISCELLANEOUS) ×3 IMPLANT
BLANKET WARM UNDERBOD FULL ACC (MISCELLANEOUS) ×3 IMPLANT
CATH NAVISTAR SMARTTOUCH DF (ABLATOR) ×3 IMPLANT
CATH SOUNDSTAR ECO REPROCESSED (CATHETERS) ×3 IMPLANT
CATH VARIABLE LASSO NAV 2515 (CATHETERS) ×3 IMPLANT
CATH WEBSTER BI DIR CS D-F CRV (CATHETERS) ×3 IMPLANT
COVER SWIFTLINK CONNECTOR (BAG) ×3 IMPLANT
HOVERMATT SINGLE USE (MISCELLANEOUS) ×3 IMPLANT
NEEDLE TRANSEP BRK 71CM 407200 (NEEDLE) ×3 IMPLANT
PACK EP LATEX FREE (CUSTOM PROCEDURE TRAY) ×3
PACK EP LF (CUSTOM PROCEDURE TRAY) ×1 IMPLANT
PAD DEFIB LIFELINK (PAD) ×3 IMPLANT
PATCH CARTO3 (PAD) ×3 IMPLANT
SHEATH AVANTI 11F 11CM (SHEATH) ×3 IMPLANT
SHEATH PINNACLE 7F 10CM (SHEATH) ×6 IMPLANT
SHEATH PINNACLE 9F 10CM (SHEATH) ×3 IMPLANT
SHEATH SWARTZ TS SL2 63CM 8.5F (SHEATH) ×3 IMPLANT
SHIELD RADPAD SCOOP 12X17 (MISCELLANEOUS) ×3 IMPLANT
TUBING SMART ABLATE COOLFLOW (TUBING) ×3 IMPLANT

## 2016-08-25 NOTE — OR Nursing (Signed)
TEE probe inserted 0904  And scope out 0909

## 2016-08-25 NOTE — Discharge Summary (Signed)
ELECTROPHYSIOLOGY PROCEDURE DISCHARGE SUMMARY    Patient ID: Austin Taylor,  MRN: [id number], DOB/AGE: 1940/07/20 76 y.o.  Admit date: 08/25/2016 Discharge date: 08/26/16  Primary Care Physician: Maury Dus, MD  Primary Cardiologist: Dr. Meda Coffee Electrophysiologist: [name], MD  Primary Discharge Diagnosis:  1. Persistent Afib     CHA2DS2Vasc is at least 4, on Eliquis  Secondary Discharge Diagnosis:  1. 1st degree AVBlock 2. OSA     Compliant with CPAP  Procedures This Admission:  1.  Electrophysiology study and radiofrequency catheter ablation on 08/25/16 by Dr [name].   his study demonstrated    CONCLUSIONS: 1. Sinus rhythm upon presentation.   2. Intracardiac echo reveals a very large and vertical left atrium with four separate pulmonary veins without evidence of pulmonary vein stenosis.  There was no LAA thrombus noted. 3. Successful electrical isolation and anatomical encircling of all four pulmonary veins with radiofrequency current. 4. No inducible arrhythmias following ablation both on and off of Isuprel 5. No early apparent complications.  Brief HPI: Austin Taylor is a 76 y.o. male with a history of persistent atrial fibrillation.  They have failed medical therapy with Tikosyn. Risks, benefits, and alternatives to catheter ablation of atrial fibrillation were reviewed with the patient who wished to proceed.  The patient underwent TEE prior to the procedure which was unable to visualize the cardiac structures 2/2 large hiatal hernia.    Hospital Course:  The patient was admitted and underwent EPS/RFCA of atrial fibrillation with details as outlined above.  He was monitored on telemetry overnight which demonstrated sinus rhythm.  Groin/Procedure site was without complication on the day of discharge.  The patient was examined by Dr.  Rayann Heman and considered to be stable for discharge.  Wound care and restrictions were reviewed with the patient.  The patient  will be seen back by Roderic Palau, NP in 4 weeks and Dr Rayann Heman in 12 weeks for post ablation follow up.    Physical Exam: Vitals:   08/25/16 2113 08/26/16 0049 08/26/16 0441 08/26/16 0729  BP: (!) 110/55 (!) 115/59 (!) 108/51 122/67  Pulse: 67 65  62  Resp:    18  Temp: 98.4 F (36.9 C) 98 F (36.7 C) 98.3 F (36.8 C) 97.8 F (36.6 C)  TempSrc: Oral Oral Oral Oral  SpO2: 97% 93%  92%  Weight:   247 lb 3.2 oz (112.1 kg)   Height:        GEN- The patient is well appearing, alert and oriented x 3 today.   HEENT: normocephalic, atraumatic; sclera clear, conjunctiva pink; hearing intact; oropharynx clear; neck supple  Lungs-  CTA b/l, normal work of breathing.  No wheezes, rales, rhonchi Heart-  RRR, no murmurs, rubs or gallops  GI- soft, non-tender, non-distended  Extremities- no clubbing, cyanosis, or edema; DP/PT/radial pulses 2+ bilaterally,  R groin without hematoma/bruit MS- no significant deformity or atrophy Skin- warm and dry, no rash or lesion Psych- euthymic mood, full affect Neuro- strength and sensation are intact   Labs:   Lab Results  Component Value Date   WBC 7.5 08/18/2016   HGB 15.0 08/18/2016   HCT 43.2 08/18/2016   MCV 90 08/18/2016   PLT 250 08/18/2016   No results for input(s): NA, K, CL, CO2, BUN, CREATININE, CALCIUM, PROT, BILITOT, ALKPHOS, ALT, AST, GLUCOSE in the last 168 hours.  Invalid input(s): LABALBU   Discharge Medications:  Allergies as of 08/26/2016      Reactions  Codeine Nausea Only         Medication List    TAKE these medications   diltiazem 60 MG tablet Commonly known as:  CARDIZEM Take 1-2 tablets by mouth every 6 hours as needed for AFIB   dofetilide 250 MCG capsule Commonly known as:  TIKOSYN Take 1 capsule (250 mcg total) by mouth 2 (two) times daily.   ELIQUIS 5 MG Tabs tablet Generic drug:  apixaban TAKE 1 TABLET BY MOUTH TWICE DAILY   ferrous sulfate 325 (65 FE) MG tablet Take 325 mg by mouth every Monday,  Wednesday, and Friday.   furosemide 40 MG tablet Commonly known as:  LASIX TAKE 1 TABLET BY MOUTH DAILY   pantoprazole 40 MG tablet Commonly known as:  PROTONIX Take 1 tablet (40 mg total) by mouth daily.   sildenafil 20 MG tablet Commonly known as:  REVATIO Take 20 mg by mouth daily as needed (for ED). Take as directed   simvastatin 40 MG tablet Commonly known as:  ZOCOR TAKE 1 TABLET BY MOUTH ONCE DAILY       Disposition: Home  Follow-up Information    [facility] Follow up on 09/22/2016.   Specialty:  Cardiology Why:  8:30AM Contact information: [address] [phone] [place] [phone]       [name], MD Follow up on 11/26/2016.   Specialty:  Cardiology Why:  8:00AM Contact information: [place] [address] [phone]           Duration of Discharge Encounter: Greater than 30 minutes including physician time.  Army Fossa MD 08/26/2016 9:03 AM

## 2016-08-25 NOTE — Anesthesia Postprocedure Evaluation (Signed)
Anesthesia Post Note  Patient: Austin Taylor  Procedure(s) Performed: Procedure(s) (LRB): Atrial Fibrillation Ablation (N/A)     Patient location during evaluation: Cath Lab Anesthesia Type: General Level of consciousness: awake and alert Pain management: pain level controlled Vital Signs Assessment: post-procedure vital signs reviewed and stable Respiratory status: spontaneous breathing, nonlabored ventilation, respiratory function stable and patient connected to nasal cannula oxygen Cardiovascular status: blood pressure returned to baseline and stable Postop Assessment: no signs of nausea or vomiting Anesthetic complications: no    Last Vitals:  Vitals:   08/25/16 1649 08/25/16 1705  BP: 138/71 138/81  Pulse: 67 71  Resp: 20   Temp: 36.8 C     Last Pain:  Vitals:   08/25/16 1649  TempSrc: Oral                 Ryan P Ellender

## 2016-08-25 NOTE — Transfer of Care (Signed)
Immediate Anesthesia Transfer of Care Note  Patient: Austin Taylor  Procedure(s) Performed: Procedure(s): Atrial Fibrillation Ablation (N/A)  Patient Location: PACU and Cath Lab  Anesthesia Type:General  Level of Consciousness: awake, alert  and oriented  Airway & Oxygen Therapy: Patient Spontanous Breathing and Patient connected to nasal cannula oxygen  Post-op Assessment: Report given to RN and Post -op Vital signs reviewed and stable  Post vital signs: Reviewed and stable  Last Vitals:  Vitals:   08/25/16 1010 08/25/16 1417  BP: (!) 103/48   Pulse: (!) 51   Resp: 19   Temp:  36.4 C    Last Pain:  Vitals:   08/25/16 1417  TempSrc: Temporal         Complications: No apparent anesthesia complications

## 2016-08-25 NOTE — H&P (View-Only) (Signed)
Electrophysiology Office Note Date: 07/29/2016  ID:  Austin Taylor, DOB [birth year], MRN [id number]  PCP: Maury Dus, MD Primary Cardiologist: Meda Coffee Electrophysiologist: Allred  CC: AF follow up  Austin Taylor is a 76 y.o. male seen today for Dr Rayann Heman.  He presents today for routine electrophysiology followup.  Since last being seen in our clinic, the patient reports doing relatively well.  He has not noticed significant change since stopping scheduled Diltiazem and has not taken any prn.  Sleep study with Dr Maxwell Caul demonstrated OSA and he has been compliant with CPAP for at least the last 2 weeks.  He feels that AF burden has not changed.  He continues with intermittent shortness of breath that is consistent with prior symptoms of AF. He recently also tripped over his shoe (he is clear he did not have syncope) and broke his toe for which he is currently in a boot.  There is no surgery planned and the boot will come off in the next week or so.  He denies chest pain, palpitations, PND, orthopnea, nausea, vomiting, dizziness, syncope, edema, weight gain, or early satiety.  Past Medical History:  Diagnosis Date  . Chronic kidney disease    patient denies   . Hyperlipidemia   . Persistent atrial fibrillation ([place])   . Stroke (cerebrum) [facility])    Past Surgical History:  Procedure Laterality Date  . CARDIOVERSION N/A 09/09/2015   Procedure: CARDIOVERSION;  Surgeon: Fay Records, MD;  Location: [facility];  Service: Cardiovascular;  Laterality: N/A;  . CARDIOVERSION N/A 11/20/2015   Procedure: CARDIOVERSION;  Surgeon: Pixie Casino, MD;  Location: [facility] ENDOSCOPY;  Service: Cardiovascular;  Laterality: N/A;  . LOOP RECORDER INSERTION N/A 04/21/2016   Procedure: Loop Recorder Insertion;  Surgeon: [name], MD;  Location: [facility] CV LAB;  Service: Cardiovascular;  Laterality: N/A;  . MOHS SURGERY    . ROTATOR CUFF REPAIR Left 1998    Current Outpatient Prescriptions    Medication Sig Dispense Refill  . diltiazem (CARDIZEM) 60 MG tablet Take 1-2 tablets by mouth every 6 hours as needed for AFIB 60 tablet 11  . dofetilide (TIKOSYN) 250 MCG capsule Take 1 capsule (250 mcg total) by mouth 2 (two) times daily. 60 capsule 11  . ELIQUIS 5 MG TABS tablet TAKE 1 TABLET BY MOUTH TWICE DAILY 60 tablet 5  . ferrous sulfate 325 (65 FE) MG tablet Take 325 mg by mouth 3 (three) times a week. M, W, F    . furosemide (LASIX) 40 MG tablet TAKE 1 TABLET BY MOUTH DAILY 90 tablet 3  . sildenafil (REVATIO) 20 MG tablet Take 20 mg by mouth as needed (for ED). Take as directed    . simvastatin (ZOCOR) 40 MG tablet TAKE 1 TABLET BY MOUTH ONCE DAILY 30 tablet 10   No current facility-administered medications for this visit.     Allergies:   Codeine   Social History: Social History   Social History  . Marital status: Married    Spouse name: N/A  . Number of children: 2  . Years of education: 14   Occupational History  . Firefighter Other    Retired   Social History Main Topics  . Smoking status: Former Smoker    Quit date: 08/02/1974  . Smokeless tobacco: Never Used  . Alcohol use Yes     Comment: occassional  . Drug use: No  . Sexual activity: Not on file   Other Topics Concern  .  Not on file   Social History Narrative   Patient is married with 2 children.   Patient is right handed.   Patient has 14 yrs of education.   Patient does not drink caffeine.    Family History: Family History  Problem Relation Age of Onset  . Heart disease Mother   . Heart disease Father   . Other Father        Carotid Artery Stenosis  . Heart disease Brother     Review of Systems: All other systems reviewed and are otherwise negative except as noted above.   Physical Exam: VS:  BP 126/82   Pulse 60   Ht 6' (1.829 m)   Wt 242 lb 1.6 oz (109.8 kg)   SpO2 94%   BMI 32.83 kg/m  , BMI Body mass index is 32.83 kg/m. Wt Readings from Last 3 Encounters:  07/29/16 242  lb 1.6 oz (109.8 kg)  05/18/16 243 lb 2 oz (110.3 kg)  05/05/16 243 lb 12.8 oz (110.6 kg)    GEN- The patient is elderly appearing, alert and oriented x 3 today.   HEENT: normocephalic, atraumatic; sclera clear, conjunctiva pink; hearing intact; oropharynx clear; neck supple Lungs- Clear to ausculation bilaterally, normal work of breathing.  No wheezes, rales, rhonchi Heart- Regular rate and rhythm  GI- soft, non-tender, non-distended, bowel sounds present  Extremities- no clubbing, cyanosis, or edema; +orthopedic boot on right foot MS- no significant deformity or atrophy Skin- warm and dry, no rash or lesion  Psych- euthymic mood, full affect Neuro- strength and sensation are intact   EKG:  EKG is ordered today. The ekg ordered today shows sinus rhythm, rate 60, 1st degree AV block (PR 227msec), QTc 473msec manually  Recent Labs: 11/19/2015: Hemoglobin 14.6; Platelets 217 04/21/2016: BUN 12; Creatinine, Ser 1.07; Magnesium 2.1; Potassium 4.1; Sodium 139    Other studies Reviewed: Additional studies/ records that were reviewed today include: Dr Jackalyn Lombard office notes  Assessment and Plan:  1.  Persistent atrial fibrillation Burden by ILR interrogation today 30% V rates controlled Continue Eliquis long term for CHADS2VASC of 4 He underwent sleep study with Dr Maxwell Caul which demonstrated OSA and he has been compliant with CPAP without significant change in AF burden or symptoms.  Continue Tikosyn. We have again discussed PVI as an option today. He would like to proceed with PVI. Risks, benefits previously discussed with patient by Dr Rayann Heman and also discussed by me again today.  We will schedule at next available time with Dr Rayann Heman. Plan TEE prior to procedure. We discussed importance of compliance with Eliquis with no missed doses today.   2.  1st degree AV block Stable No change required today Avoid AVN blocking agents    3.  Obesity Body mass index is 32.83 kg/m. Weight loss  recommended  4.  OSA Continued compliance with CPAP encouraged    Current medicines are reviewed at length with the patient today.   The patient does not have concerns regarding his medicines.  The following changes were made today:  none  Labs/ tests ordered today include: pre-procedure labs No orders of the defined types were placed in this encounter.    Disposition:   Follow up with Dr Rayann Heman following AF ablation      Signed, Chanetta Marshall, NP 07/29/2016 8:21 AM   [address] [place] [address] [phone] (office) [phone] (fax)

## 2016-08-25 NOTE — Progress Notes (Signed)
Site area: 3 rt fv sheaths Site Prior to Removal:  Level 0 Pressure Applied For: 30 minutes Manual:  yes  Patient Status During Pull:  stable Post Pull Site:  Level  0 Post Pull Instructions Given:  yes Post Pull Pulses Present: yes Dressing Applied:  Gauze and tegaderm Bedrest begins @ 1505 Comments:  Rt groin 3 fv sheaths

## 2016-08-25 NOTE — Interval H&P Note (Signed)
History and Physical Interval Note:  08/25/2016 8:40 AM  Austin Taylor  has presented today for surgery, with the diagnosis of AFIB  The various methods of treatment have been discussed with the patient and family. After consideration of risks, benefits and other options for treatment, the patient has consented to  Procedure(s): TRANSESOPHAGEAL ECHOCARDIOGRAM (TEE) (N/A) as a surgical intervention .  The patient's history has been reviewed, patient examined, no change in status, stable for surgery.  I have reviewed the patient's chart and labs.  Questions were answered to the patient's satisfaction.     [employer]

## 2016-08-25 NOTE — Interval H&P Note (Signed)
History and Physical Interval Note:  08/25/2016 8:23 AM  Austin Taylor  has presented today for surgery, with the diagnosis of afib  The various methods of treatment have been discussed with the patient and family. After consideration of risks, benefits and other options for treatment, the patient has consented to  Procedure(s): Atrial Fibrillation Ablation (N/A) as a surgical intervention .  The patient's history has been reviewed, patient examined, no change in status, stable for surgery.  I have reviewed the patient's chart and labs.  Questions were answered to the patient's satisfaction.     Risk, benefits, and alternatives to EP study and radiofrequency ablation for afib were also discussed in detail today. These risks include but are not limited to stroke, bleeding, vascular damage, tamponade, perforation, damage to the esophagus, lungs, and other structures, pulmonary vein stenosis, worsening renal function, and death. The patient understands these risk and wishes to proceed.  TEE this am prior to ablation.  He reports compliance with eliquis without interruption.   [name]

## 2016-08-25 NOTE — Progress Notes (Signed)
Received pt from Endo alert and oriented X4.  Pt on monitor and IVF infusing. Pt denies any discomfort at this time.

## 2016-08-25 NOTE — Anesthesia Preprocedure Evaluation (Signed)
Anesthesia Evaluation  Patient identified by MRN, date of birth, ID band Patient awake    Reviewed: Allergy & Precautions, NPO status , Patient's Chart, lab work & pertinent test results  Airway Mallampati: II   Neck ROM: Full    Dental no notable dental hx.    Pulmonary sleep apnea , former smoker,    breath sounds clear to auscultation       Cardiovascular hypertension, +CHF   Rhythm:Irregular Rate:Normal     Neuro/Psych CVA    GI/Hepatic GERD  ,  Endo/Other  negative endocrine ROS  Renal/GU Renal InsufficiencyRenal disease     Musculoskeletal   Abdominal   Peds  Hematology negative hematology ROS (+)   Anesthesia Other Findings   Reproductive/Obstetrics                             Anesthesia Physical Anesthesia Plan  ASA: III  Anesthesia Plan: General   Post-op Pain Management:    Induction: Intravenous  PONV Risk Score and Plan: 2 and Ondansetron and Dexamethasone  Airway Management Planned: LMA  Additional Equipment:   Intra-op Plan:   Post-operative Plan: Extubation in OR  Informed Consent: I have reviewed the patients History and Physical, chart, labs and discussed the procedure including the risks, benefits and alternatives for the proposed anesthesia with the patient or authorized representative who has indicated his/her understanding and acceptance.   Dental advisory given  Plan Discussed with: CRNA  Anesthesia Plan Comments:         Anesthesia Quick Evaluation

## 2016-08-25 NOTE — Anesthesia Procedure Notes (Signed)
Procedure Name: LMA Insertion Date/Time: 08/25/2016 11:24 AM Performed by: Ollen Bowl Pre-anesthesia Checklist: Patient identified, Emergency Drugs available, Suction available, Patient being monitored and Timeout performed Patient Re-evaluated:Patient Re-evaluated prior to inductionOxygen Delivery Method: Circle system utilized and Simple face mask Preoxygenation: Pre-oxygenation with 100% oxygen Intubation Type: IV induction Ventilation: Mask ventilation with difficulty LMA: LMA inserted LMA Size: 5.0 Number of attempts: 1 Airway Equipment and Method: Patient positioned with wedge pillow Placement Confirmation: positive ETCO2 and breath sounds checked- equal and bilateral Tube secured with: Tape Dental Injury: Teeth and Oropharynx as per pre-operative assessment

## 2016-08-25 NOTE — CV Procedure (Signed)
   TEE:  Indication: AFIB ablation  Timeout performed.  During this procedure the patient is administered  Versed  and Fentanyl  to achieve and maintain moderate conscious sedation.  The patient's heart rate, blood pressure, and oxygen saturation are monitored continuously during the procedure. The period of conscious sedation is 20 minutes, of which I was present face-to-face 100% of this time. Tolerated the procedure well. No complications.   Findings:  Aorta appears normal  Unable to visualize cardiac structures due to large hiatal hernia obstructing view. Discussed with fellow imaging colleague.  Personally viewed CXR which demonstrates hiatal hernia He is under the care of Dr. Cristina Gong of GI for this.  Discussed with family, patient and Dr. Rayann Heman. Currently in NSR with first degree AVB.  Candee Furbish, MD

## 2016-08-26 ENCOUNTER — Encounter (HOSPITAL_COMMUNITY): Payer: Self-pay | Admitting: Internal Medicine

## 2016-08-26 DIAGNOSIS — I480 Paroxysmal atrial fibrillation: Secondary | ICD-10-CM

## 2016-08-26 DIAGNOSIS — E785 Hyperlipidemia, unspecified: Secondary | ICD-10-CM | POA: Diagnosis not present

## 2016-08-26 DIAGNOSIS — G4733 Obstructive sleep apnea (adult) (pediatric): Secondary | ICD-10-CM | POA: Diagnosis not present

## 2016-08-26 DIAGNOSIS — I481 Persistent atrial fibrillation: Secondary | ICD-10-CM | POA: Diagnosis not present

## 2016-08-26 MED ORDER — PANTOPRAZOLE SODIUM 40 MG PO TBEC: 40.0000 mg | DELAYED_RELEASE_TABLET | Freq: Every day | ORAL | 0 refills | Status: DC

## 2016-08-26 NOTE — Progress Notes (Signed)
Discharge instructions (including medications) discussed with and copy provided to patient/caregiver 

## 2016-08-26 NOTE — Discharge Instructions (Signed)
No driving for 4 days. No lifting over 5 lbs for 1 week. No vigorous or sexual activity for 1 week. You may return to work on 09/01/16. Keep procedure site clean & dry. If you notice increased pain, swelling, bleeding or pus, call/return!  You may shower, but no soaking baths/hot tubs/pools for 1 week.   You have an appointment set up with the [facility].  Multiple studies have shown that being followed by a dedicated atrial fibrillation clinic in addition to the standard care you receive from your other physicians improves health. We believe that enrollment in the atrial fibrillation clinic will allow [place] to better care for you.   The phone number to the [facility] is [phone]. The clinic is staffed Monday through Friday from 8:30am to 5pm.  Parking Directions: The clinic is located in the Heart and Vascular Building connected to [facility]. 1)From [address] turn on to [address] and go to the 3rd entrance  (Heart and Vascular entrance) on the right. 2)Look to the right for Heart &Vascular Parking Garage. 3)A code for the entrance is required please call the clinic to receive this.   4)Take the elevators to the 1st floor. Registration is in the room with the glass walls at the end of the hallway.  If you have any trouble parking or locating the clinic, please dont hesitate to call [phone].

## 2016-09-12 ENCOUNTER — Other Ambulatory Visit: Payer: Self-pay | Admitting: Internal Medicine

## 2016-09-12 DIAGNOSIS — I480 Paroxysmal atrial fibrillation: Secondary | ICD-10-CM

## 2016-09-12 DIAGNOSIS — E785 Hyperlipidemia, unspecified: Secondary | ICD-10-CM

## 2016-09-12 DIAGNOSIS — I5033 Acute on chronic diastolic (congestive) heart failure: Secondary | ICD-10-CM

## 2016-09-14 NOTE — Telephone Encounter (Signed)
Request received for Eliquis 5mg ; pt is 76 yrs old, wt-109.8kg, Crea-1.04 on 08/18/16, last seen by Chanetta Marshall on 07/29/16. Will send in refill request to requested Pharmacy.

## 2016-09-18 ENCOUNTER — Ambulatory Visit (INDEPENDENT_AMBULATORY_CARE_PROVIDER_SITE_OTHER): Payer: Medicare Other | Admitting: *Deleted

## 2016-09-18 DIAGNOSIS — I4892 Unspecified atrial flutter: Secondary | ICD-10-CM

## 2016-09-21 NOTE — Progress Notes (Signed)
Carelink Summary Report / Loop Recorder 

## 2016-09-22 ENCOUNTER — Encounter (HOSPITAL_COMMUNITY): Payer: Self-pay | Admitting: Nurse Practitioner

## 2016-09-22 ENCOUNTER — Ambulatory Visit (HOSPITAL_COMMUNITY)
Admission: RE | Admit: 2016-09-22 | Discharge: 2016-09-22 | Disposition: A | Discharge status: Home / Self Care | Payer: Medicare Other | Source: Ambulatory Visit | Attending: Nurse Practitioner | Admitting: Nurse Practitioner

## 2016-09-22 VITALS — BP 118/78 | HR 56 | Ht 72.0 in | Wt 240.0 lb

## 2016-09-22 DIAGNOSIS — Z87891 Personal history of nicotine dependence: Secondary | ICD-10-CM | POA: Insufficient documentation

## 2016-09-22 DIAGNOSIS — N189 Chronic kidney disease, unspecified: Secondary | ICD-10-CM | POA: Insufficient documentation

## 2016-09-22 DIAGNOSIS — Z7902 Long term (current) use of antithrombotics/antiplatelets: Secondary | ICD-10-CM | POA: Diagnosis not present

## 2016-09-22 DIAGNOSIS — G4733 Obstructive sleep apnea (adult) (pediatric): Secondary | ICD-10-CM | POA: Diagnosis not present

## 2016-09-22 DIAGNOSIS — Z8249 Family history of ischemic heart disease and other diseases of the circulatory system: Secondary | ICD-10-CM | POA: Insufficient documentation

## 2016-09-22 DIAGNOSIS — Z885 Allergy status to narcotic agent status: Secondary | ICD-10-CM | POA: Diagnosis not present

## 2016-09-22 DIAGNOSIS — I4892 Unspecified atrial flutter: Secondary | ICD-10-CM

## 2016-09-22 DIAGNOSIS — E785 Hyperlipidemia, unspecified: Secondary | ICD-10-CM | POA: Diagnosis not present

## 2016-09-22 DIAGNOSIS — Z8673 Personal history of transient ischemic attack (TIA), and cerebral infarction without residual deficits: Secondary | ICD-10-CM | POA: Insufficient documentation

## 2016-09-22 DIAGNOSIS — Z9889 Other specified postprocedural states: Secondary | ICD-10-CM | POA: Diagnosis not present

## 2016-09-22 DIAGNOSIS — I481 Persistent atrial fibrillation: Secondary | ICD-10-CM | POA: Diagnosis not present

## 2016-09-22 NOTE — Progress Notes (Signed)
Laurena Bering Patient ID: LEO FRAY, male   DOB: 09/26/1940, 76 y.o.   MRN: [id number]    Primary Care Physician: Maury Dus, MD Referring Physician: Dr. Jolinda Croak DANZELL BIRKY is a 76 y.o. male with a h/o PAF dating back to 2 years ago, found at the time of a CVA. He was found to be in afib in June with a check up with Dr. Meda Coffee. He reported racing of the heart that started at 3 am the same morning of visit. He had experienced shortness of breath and fatigue for the previous few weeks. He was scheduled for cardioversion and did convert but had ERAF. He states that for a few weeks, he felt "great" with much more energy.  He is seen in the afib clinic to discuss antiarrythmic therapy to restore SR. EKG shows afib with slow v. rate at 54 bpm, LAFB. EKG's in SR reveal first degree AVB pr int 280 ms. Creat 1.06. Left atrium size is 41 mm. It was discussed with Dr. Rayann Heman and decided due to conduction issues, tikosyn was best option.   He returns to Afib clinic, 9/5, after hospitalization for tikosyn loading 8/22 to 8/25. He initially converted to SR with Tikosyn but then reverted to AF prior to discharge. He was monitored until discharge on telemetry which demonstrated AF.  On the day of discharge,he was examined by Dr Rayann Heman who considered him stable for discharge to home, discharged on tikosyn 250 mcg bid.  He has significant underlying conduction system disease and for this reason, AAD therapy is limited. If still in AF at follow up visit, consider repeat DCCV. May eventually need PVI. Pt does continue in afib, will plan on cardioversion, pt did feel better in SR in hospital, more energy. No missed doses of apixaban.  F/u in afib clinic,7/10, pt had an ablation one month ago. Denies any swallowing issues. No groin issues. No afib that he is aware of. Continues on dofetilide and eliquis.  Today, he denies symptoms of palpitations, chest pain, shortness of breath, orthopnea, PND, lower extremity  edema, dizziness, presyncope, syncope, or neurologic sequela. C/o of fatigue. The patient is tolerating medications without difficulties and is otherwise without complaint today.   Past Medical History:  Diagnosis Date  . Chronic kidney disease    patient denies   . History of hiatal hernia   . Hyperlipidemia   . OSA on CPAP   . Persistent atrial fibrillation ([place])   . Primary cancer of skin of ear    "might have been on my left ear"  . 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 ATRIAL FIBRILLATION ABLATION  08/25/2016  . ATRIAL FIBRILLATION ABLATION N/A 08/25/2016   Procedure: Atrial Fibrillation Ablation;  Surgeon: [name], MD;  Location: [place] [facility];  Service: Cardiovascular;  Laterality: N/A;  . CARDIOVERSION N/A 09/09/2015   Procedure: CARDIOVERSION;  Surgeon: Fay Records, MD;  Location: [place];  Service: Cardiovascular;  Laterality: N/A;  . CARDIOVERSION N/A 11/20/2015   Procedure: CARDIOVERSION;  Surgeon: Pixie Casino, MD;  Location: [facility];  Service: Cardiovascular;  Laterality: N/A;  . LOOP RECORDER INSERTION N/A 04/21/2016   Procedure: Loop Recorder Insertion;  Surgeon: [name], MD;  Location: [place] [facility];  Service: Cardiovascular;  Laterality: N/A;  . MOHS SURGERY     ear  . SHOULDER OPEN ROTATOR CUFF REPAIR Left 1998  .  TEE WITHOUT CARDIOVERSION N/A 08/25/2016   Procedure: TRANSESOPHAGEAL ECHOCARDIOGRAM (TEE);  Surgeon: Jerline Pain, MD;  Location: [facility] ENDOSCOPY;  Service: Cardiovascular;  Laterality: N/A;    Current Outpatient Prescriptions  Medication Sig Dispense Refill  . dofetilide (TIKOSYN) 250 MCG capsule Take 1 capsule (250 mcg total) by mouth 2 (two) times daily. 60 capsule 11  . ELIQUIS 5 MG TABS tablet TAKE 1 TABLET BY MOUTH TWICE DAILY 60 tablet 11  . ferrous sulfate 325 (65 FE) MG tablet Take 325 mg by mouth every Monday, Wednesday,  and Friday.     . furosemide (LASIX) 40 MG tablet TAKE 1 TABLET BY MOUTH DAILY 90 tablet 3  . pantoprazole (PROTONIX) 40 MG tablet Take 1 tablet (40 mg total) by mouth daily. 45 tablet 0  . sildenafil (REVATIO) 20 MG tablet Take 20 mg by mouth daily as needed (for ED). Take as directed    . simvastatin (ZOCOR) 40 MG tablet TAKE 1 TABLET BY MOUTH ONCE DAILY 30 tablet 10  . diltiazem (CARDIZEM) 60 MG tablet Take 1-2 tablets by mouth every 6 hours as needed for AFIB (Patient not taking: Reported on 09/22/2016) 60 tablet 11   No current facility-administered medications for this encounter.     Allergies  Allergen Reactions  . Codeine Nausea Only         Social History   Social History  . Marital status: Married    Spouse name: N/A  . Number of children: 2  . Years of education: 14   Occupational History  . Firefighter Other    Retired   Social History Main Topics  . Smoking status: Former Smoker    Packs/day: 1.00    Years: 17.00    Types: Cigarettes    Quit date: 08/02/1974  . Smokeless tobacco: Former Systems developer    Quit date: 1977  . Alcohol use 1.2 oz/week    2 Cans of beer per week  . Drug use: No  . Sexual activity: Yes   Other Topics Concern  . Not on file   Social History Narrative   Patient is married with 2 children.   Patient is right handed.   Patient has 14 yrs of education.   Patient does not drink caffeine.    Family History  Problem Relation Age of Onset  . Heart disease Mother   . Heart disease Father   . Other Father        Carotid Artery Stenosis  . Heart disease Brother     ROS- All systems are reviewed and negative except as per the HPI above  Physical Exam: Vitals:   09/22/16 0833  BP: 118/78  Pulse: (!) 56  Weight: 240 lb (108.9 kg)  Height: 6' (1.829 m)    GEN- The patient is well appearing, alert and oriented x 3 today.   Head- normocephalic, atraumatic Eyes-  Sclera clear, conjunctiva pink Ears- hearing intact Oropharynx-  clear Neck- supple, no JVP Lymph- no cervical lymphadenopathy Lungs- Clear to ausculation bilaterally, normal work of breathing Heart-regular rate and rhythm, no murmurs, rubs or gallops, PMI not laterally displaced GI- soft, NT, ND, + BS Extremities- no clubbing, cyanosis, or edema MS- no significant deformity or atrophy Skin- no rash or lesion Psych- euthymic mood, full affect Neuro- strength and sensation are intact  EKG-sinus brady at 56 bpm, pr int 268 ms, qrs int 80 ms, qtc 486 ms Epic records reviewed  Echo-10/15 - Left ventricle: The cavity size was normal. There was  mild concentric hypertrophy. Systolic function was normal. The estimated ejection fraction was in the range of 60% to 65%. Wall motion was normal; there were no regional wall motion abnormalities. Features are consistent with a pseudonormal left ventricular filling pattern, with concomitant abnormal relaxation and increased filling pressure (grade 2 diastolic dysfunction). There was no evidence of elevated ventricular filling pressure by Doppler parameters. - Aortic valve: There was no regurgitation. - Aortic root: The aortic root was normal in size. - Mitral valve: There was mild regurgitation. - Left atrium: The atrium was mildly dilated. - Right ventricle: Systolic function was normal. - Right atrium: The atrium was normal in size. - Tricuspid valve: There was mild regurgitation. - Pulmonary arteries: Systolic pressure was within the normal range. - Inferior vena cava: The vessel was normal in size. - Pericardium, extracardiac: There was no pericardial effusion.  Impressions:  - Normal biventricular size and systolic function. Pseudonormal pattern of diastolic dysfunction with normal filling pressures. No significant valvular abnormality.  ASSESSMENT/PLAN  1. Persistent  Symptomatic afib   Ablation 6/12 Maintaining SR Continue dofetilide, dilt No missed doses of apixaban,  continue apixaban 5 mg bid for chadsvasc score of 3 Resume normal activities  Remote check 8/6 F/u with Dr. Rayann Heman 9/13 afib clinic as needed  Butch Penny C. Tianne Plott, [facility] [address] [place], [place] [address] [phone]

## 2016-09-24 LAB — CUP PACEART REMOTE DEVICE CHECK
Date Time Interrogation Session: 20180706200831
Implantable Pulse Generator Implant Date: 20180206

## 2016-10-19 ENCOUNTER — Ambulatory Visit (INDEPENDENT_AMBULATORY_CARE_PROVIDER_SITE_OTHER): Payer: Medicare Other | Admitting: *Deleted

## 2016-10-19 DIAGNOSIS — I4892 Unspecified atrial flutter: Secondary | ICD-10-CM | POA: Diagnosis not present

## 2016-10-20 NOTE — Progress Notes (Signed)
Carelink Summary Report / Loop Recorder 

## 2016-10-28 LAB — CUP PACEART REMOTE DEVICE CHECK
Date Time Interrogation Session: 20180805203829
MDC IDC PG IMPLANT DT: 20180206

## 2016-11-06 ENCOUNTER — Encounter: Payer: Self-pay | Admitting: Internal Medicine

## 2016-11-17 ENCOUNTER — Ambulatory Visit (INDEPENDENT_AMBULATORY_CARE_PROVIDER_SITE_OTHER): Payer: Medicare Other | Admitting: *Deleted

## 2016-11-17 DIAGNOSIS — I4892 Unspecified atrial flutter: Secondary | ICD-10-CM | POA: Diagnosis not present

## 2016-11-18 NOTE — Progress Notes (Signed)
Carelink Summary Report / Loop Recorder 

## 2016-11-23 LAB — CUP PACEART REMOTE DEVICE CHECK
Date Time Interrogation Session: 20180904213751
MDC IDC PG IMPLANT DT: 20180206

## 2016-11-26 ENCOUNTER — Ambulatory Visit (INDEPENDENT_AMBULATORY_CARE_PROVIDER_SITE_OTHER): Payer: Medicare Other | Admitting: Internal Medicine

## 2016-11-26 ENCOUNTER — Encounter: Payer: Self-pay | Admitting: Internal Medicine

## 2016-11-26 VITALS — BP 128/80 | HR 55 | Ht 72.0 in | Wt 240.8 lb

## 2016-11-26 DIAGNOSIS — I4892 Unspecified atrial flutter: Secondary | ICD-10-CM

## 2016-11-26 DIAGNOSIS — G4733 Obstructive sleep apnea (adult) (pediatric): Secondary | ICD-10-CM | POA: Diagnosis not present

## 2016-11-26 DIAGNOSIS — E669 Obesity, unspecified: Secondary | ICD-10-CM | POA: Diagnosis not present

## 2016-11-26 LAB — CUP PACEART INCLINIC DEVICE CHECK
Implantable Pulse Generator Implant Date: 20180206
MDC IDC SESS DTM: 20180913110138

## 2016-11-26 NOTE — Patient Instructions (Signed)
Medication Instructions:  None ordered  Labwork: None ordered   Testing/Procedures: None ordered   Follow-Up: Your physician recommends that you schedule a follow-up appointment in: 3 months with Dr Rayann Heman   Any Other Special Instructions Will Be Listed Below (If Applicable).     If you need a refill on your cardiac medications before your next appointment, please call your pharmacy.

## 2016-11-26 NOTE — Progress Notes (Signed)
PCP: Maury Dus, MD Primary Cardiologist: Dr Juliene Pina is a 76 y.o. male who presents today for routine electrophysiology followup.  Since his recent afib ablation, the patient reports doing very well.  he denies procedure related complications and is pleased with the results of the procedure.  Today, he denies symptoms of palpitations, chest pain, shortness of breath,  lower extremity edema, dizziness, presyncope, or syncope.  The patient is otherwise without complaint today.   Past Medical History:  Diagnosis Date  . Chronic kidney disease    patient denies   . History of hiatal hernia   . Hyperlipidemia   . OSA on CPAP   . Persistent atrial fibrillation ([place])   . Primary cancer of skin of ear    "might have been on my left ear"  . 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 ATRIAL FIBRILLATION ABLATION  08/25/2016  . ATRIAL FIBRILLATION ABLATION N/A 08/25/2016   Procedure: Atrial Fibrillation Ablation;  Surgeon: [name], MD;  Location: [place] [facility];  Service: Cardiovascular;  Laterality: N/A;  . CARDIOVERSION N/A 09/09/2015   Procedure: CARDIOVERSION;  Surgeon: Fay Records, MD;  Location: [place];  Service: Cardiovascular;  Laterality: N/A;  . CARDIOVERSION N/A 11/20/2015   Procedure: CARDIOVERSION;  Surgeon: Pixie Casino, MD;  Location: [facility] ENDOSCOPY;  Service: Cardiovascular;  Laterality: N/A;  . LOOP RECORDER INSERTION N/A 04/21/2016   Procedure: Loop Recorder Insertion;  Surgeon: [name], MD;  Location: [place] [facility];  Service: Cardiovascular;  Laterality: N/A;  . MOHS SURGERY     ear  . SHOULDER OPEN ROTATOR CUFF REPAIR Left 1998  . TEE WITHOUT CARDIOVERSION N/A 08/25/2016   Procedure: TRANSESOPHAGEAL ECHOCARDIOGRAM (TEE);  Surgeon: Jerline Pain, MD;  Location: [facility] ENDOSCOPY;  Service: Cardiovascular;  Laterality: N/A;    ROS- all systems are  personally reviewed and negatives except as per HPI above  Current Outpatient Prescriptions  Medication Sig Dispense Refill  . diltiazem (CARDIZEM) 60 MG tablet Take 1-2 tablets by mouth every 6 hours as needed for AFIB 60 tablet 11  . dofetilide (TIKOSYN) 250 MCG capsule Take 1 capsule (250 mcg total) by mouth 2 (two) times daily. 60 capsule 11  . ELIQUIS 5 MG TABS tablet TAKE 1 TABLET BY MOUTH TWICE DAILY 60 tablet 11  . ferrous sulfate 325 (65 FE) MG tablet Take 325 mg by mouth every Monday, Wednesday, and Friday.     . furosemide (LASIX) 40 MG tablet TAKE 1 TABLET BY MOUTH DAILY 90 tablet 3  . sildenafil (REVATIO) 20 MG tablet Take 20 mg by mouth daily as needed (for ED). Take as directed    . simvastatin (ZOCOR) 40 MG tablet TAKE 1 TABLET BY MOUTH ONCE DAILY 30 tablet 10   No current facility-administered medications for this visit.     Physical Exam: Vitals:   11/26/16 0801  BP: 128/80  Pulse: (!) 55  SpO2: 94%  Weight: 240 lb 12.8 oz (109.2 kg)  Height: 6' (1.829 m)    GEN- The patient is well appearing, alert and oriented x 3 today.   Head- normocephalic, atraumatic Eyes-  Sclera clear, conjunctiva pink Ears- hearing intact Oropharynx- clear Lungs- Clear to ausculation bilaterally, normal work of breathing Heart- Regular rate and rhythm, no murmurs, rubs or gallops, PMI not laterally displaced GI- soft, NT, ND, + BS Extremities- no clubbing, cyanosis, or  edema  EKG tracing ordered today is personally reviewed and shows sinus rhythm 55 bpm, PR 242 msec  ILR is interrogated today (see below)  Assessment and Plan:  1. Persistent atrial fibrillation Doing well s/p ablation chads2vasc score is 4 Continue long term anticoagulation ILR reveals no afib since his ablation. (26% AF burden on last interrogation) He is reluctant to stop tikosyn but may do so when his refills run out in a month or so.  He may call device clinic to see his AF burden prior to making this  decision.  2. First degree AV block Stable No change required today  3. Obesity Body mass index is 32.66 kg/m. Stable Weight loss encouraged  4. OSA Follows with Dr Maxwell Caul Compliant with CPAP  Return to see me in 3 months  [facility]  [name] MD, [facility] 11/26/2016 8:11 AM

## 2016-12-17 ENCOUNTER — Ambulatory Visit (INDEPENDENT_AMBULATORY_CARE_PROVIDER_SITE_OTHER): Payer: Medicare Other | Admitting: *Deleted

## 2016-12-17 DIAGNOSIS — I480 Paroxysmal atrial fibrillation: Secondary | ICD-10-CM

## 2016-12-18 LAB — CUP PACEART REMOTE DEVICE CHECK
Date Time Interrogation Session: 20181004220740
MDC IDC PG IMPLANT DT: 20180206

## 2016-12-18 NOTE — Progress Notes (Signed)
Loop recorder summary report 

## 2016-12-21 ENCOUNTER — Other Ambulatory Visit: Payer: Self-pay | Admitting: Internal Medicine

## 2017-01-18 ENCOUNTER — Ambulatory Visit (INDEPENDENT_AMBULATORY_CARE_PROVIDER_SITE_OTHER): Payer: Medicare Other | Admitting: *Deleted

## 2017-01-18 DIAGNOSIS — I481 Persistent atrial fibrillation: Secondary | ICD-10-CM

## 2017-01-18 DIAGNOSIS — I4819 Other persistent atrial fibrillation: Secondary | ICD-10-CM

## 2017-01-18 NOTE — Progress Notes (Signed)
Carelink Summary Report / Loop Recorder 

## 2017-01-21 LAB — CUP PACEART REMOTE DEVICE CHECK
Date Time Interrogation Session: 20181103220719
MDC IDC PG IMPLANT DT: 20180206

## 2017-02-08 ENCOUNTER — Telehealth: Payer: Self-pay | Admitting: Internal Medicine

## 2017-02-08 NOTE — Telephone Encounter (Signed)
New Message      Pt has a cold would like to know what OTC medication he can take for it, that will not interfere with heart medication

## 2017-02-08 NOTE — Telephone Encounter (Signed)
Made patient aware that he can take OTC Coricidin HBP. Made patient aware that he should avoid decongestants and NSAIDS. Patient verbalized understanding and thanked me for the call.

## 2017-02-15 ENCOUNTER — Ambulatory Visit (INDEPENDENT_AMBULATORY_CARE_PROVIDER_SITE_OTHER): Payer: Medicare Other | Admitting: *Deleted

## 2017-02-15 DIAGNOSIS — I440 Atrioventricular block, first degree: Secondary | ICD-10-CM | POA: Diagnosis not present

## 2017-02-16 NOTE — Progress Notes (Signed)
Carelink Summary Report / Loop Recorder 

## 2017-02-23 LAB — CUP PACEART REMOTE DEVICE CHECK
Implantable Pulse Generator Implant Date: 20180206
MDC IDC SESS DTM: 20181203223927

## 2017-03-01 ENCOUNTER — Ambulatory Visit: Payer: Medicare Other | Admitting: Internal Medicine

## 2017-03-01 ENCOUNTER — Encounter (INDEPENDENT_AMBULATORY_CARE_PROVIDER_SITE_OTHER): Payer: Self-pay

## 2017-03-01 ENCOUNTER — Encounter: Payer: Self-pay | Admitting: Internal Medicine

## 2017-03-01 VITALS — BP 130/82 | HR 64 | Ht 72.0 in | Wt 244.4 lb

## 2017-03-01 DIAGNOSIS — E669 Obesity, unspecified: Secondary | ICD-10-CM

## 2017-03-01 DIAGNOSIS — I481 Persistent atrial fibrillation: Secondary | ICD-10-CM

## 2017-03-01 DIAGNOSIS — I4819 Other persistent atrial fibrillation: Secondary | ICD-10-CM

## 2017-03-01 DIAGNOSIS — G4733 Obstructive sleep apnea (adult) (pediatric): Secondary | ICD-10-CM | POA: Diagnosis not present

## 2017-03-01 NOTE — Progress Notes (Signed)
PCP: Austin Dus, MD Primary Cardiologist: Dr Austin Taylor Primary EP: Dr Austin Taylor  Austin Taylor is a 76 y.o. male who presents today for routine electrophysiology followup.  Since last being seen in our clinic, the patient reports doing very well.  + occasional SOB.  Today, he denies symptoms of palpitations, chest Taylor,  lower extremity edema, dizziness, presyncope, or syncope.  The patient is otherwise without complaint today.   Past Medical History:  Diagnosis Date  . Chronic kidney disease    patient denies   . History of hiatal hernia   . Hyperlipidemia   . OSA on CPAP   . Persistent atrial fibrillation ([place])   . Primary cancer of skin of ear    "might have been on my left ear"  . 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 ATRIAL FIBRILLATION ABLATION  08/25/2016  . ATRIAL FIBRILLATION ABLATION N/A 08/25/2016   Procedure: Atrial Fibrillation Ablation;  Surgeon: Austin Grayer, MD;  Location: [place] [facility];  Service: Cardiovascular;  Laterality: N/A;  . CARDIOVERSION N/A 09/09/2015   Procedure: CARDIOVERSION;  Surgeon: Austin Records, MD;  Location: [place];  Service: Cardiovascular;  Laterality: N/A;  . CARDIOVERSION N/A 11/20/2015   Procedure: CARDIOVERSION;  Surgeon: Austin Casino, MD;  Location: [facility] ENDOSCOPY;  Service: Cardiovascular;  Laterality: N/A;  . LOOP RECORDER INSERTION N/A 04/21/2016   Procedure: Loop Recorder Insertion;  Surgeon: Austin Grayer, MD;  Location: [place] [facility];  Service: Cardiovascular;  Laterality: N/A;  . MOHS SURGERY     ear  . SHOULDER OPEN ROTATOR CUFF REPAIR Left 1998  . TEE WITHOUT CARDIOVERSION N/A 08/25/2016   Procedure: TRANSESOPHAGEAL ECHOCARDIOGRAM (TEE);  Surgeon: Austin Pain, MD;  Location: [facility] ENDOSCOPY;  Service: Cardiovascular;  Laterality: N/A;    ROS- all systems are reviewed and negatives except as per HPI above  Current  Outpatient Medications  Medication Sig Dispense Refill  . dofetilide (TIKOSYN) 250 MCG capsule TAKE 1 CAPSULE BY MOUTH TWICE DAILY 60 capsule 10  . ELIQUIS 5 MG TABS tablet TAKE 1 TABLET BY MOUTH TWICE DAILY 60 tablet 11  . ferrous sulfate 325 (65 FE) MG tablet Take 325 mg by mouth every Monday, Wednesday, and Friday.     . furosemide (LASIX) 40 MG tablet TAKE 1 TABLET BY MOUTH DAILY 90 tablet 3  . Multiple Vitamins-Minerals (PRESERVISION AREDS 2 PO) Take 1 capsule by mouth 2 (two) times daily.    . sildenafil (REVATIO) 20 MG tablet Take 20 mg by mouth daily as needed (for ED). Take as directed    . simvastatin (ZOCOR) 40 MG tablet TAKE 1 TABLET BY MOUTH ONCE DAILY 30 tablet 10   No current facility-administered medications for this visit.     Physical Exam: Vitals:   03/01/17 1208  BP: 130/82  Pulse: 64  SpO2: 94%  Weight: 244 lb 6.4 oz (110.9 kg)  Height: 6' (1.829 m)    GEN- The patient is well appearing, alert and oriented x 3 today.   Head- normocephalic, atraumatic Eyes-  Sclera clear, conjunctiva pink Ears- hearing intact Oropharynx- clear Lungs- Clear to ausculation bilaterally, normal work of breathing Heart- Regular rate and rhythm, no murmurs, rubs or gallops, PMI not laterally displaced GI- soft, NT, ND, + BS Extremities- no clubbing, cyanosis, or edema  EKG tracing ordered today is personally reviewed and shows sinus rhythm 64 bpm, PR 188 msec,  LAD, Qtc 474 msec  Assessment and Plan:  1. Persistent atrial fibrillation Doing well s/p ablation  ILR today reveals no afib since last visit.  There was an episode of sinus with wechnebach falsely recorded as afib.  There was also a single WCT lasting less than 1 minute stop tikosyn at this time chads2vasc score is 4.  Continue on eliquis  2. Obesity Body mass index is 33.15 kg/m. Lifestyle modification again encouraged today  3. OSA On CPAP Followed by Dr Austin Taylor Return to see me in 4  months  Austin Grayer MD, [facility] 03/01/2017 12:35 PM

## 2017-03-01 NOTE — Patient Instructions (Addendum)
Medication Instructions:  Your physician has recommended you make the following change in your medication:  1) STOP Tikosyn after you finish your current prescription  Labwork: None ordered   Testing/Procedures: None ordered   Follow-Up: Your physician wants you to follow-up in: 4 months with Dr. Rayann Heman.    Remote monitoring is used to monitor your Pacemaker from home. This monitoring reduces the number of office visits required to check your device to one time per year. It allows [place] to keep an eye on the functioning of your device to ensure it is working properly. You are scheduled for a device check from home on 03/17/17. You may send your transmission at any time that day. If you have a wireless device, the transmission will be sent automatically. After your physician reviews your transmission, you will receive a postcard with your next transmission date.   Any Other Special Instructions Will Be Listed Below (If Applicable).     If you need a refill on your cardiac medications before your next appointment, please call your pharmacy.

## 2017-03-02 LAB — CUP PACEART INCLINIC DEVICE CHECK
Date Time Interrogation Session: 20181217172506
Implantable Pulse Generator Implant Date: 20180206

## 2017-03-17 ENCOUNTER — Ambulatory Visit (INDEPENDENT_AMBULATORY_CARE_PROVIDER_SITE_OTHER): Payer: Medicare Other | Admitting: *Deleted

## 2017-03-17 DIAGNOSIS — I480 Paroxysmal atrial fibrillation: Secondary | ICD-10-CM | POA: Diagnosis not present

## 2017-03-18 NOTE — Progress Notes (Signed)
Carelink Summary Report / Loop Recorder 

## 2017-03-30 LAB — CUP PACEART REMOTE DEVICE CHECK
Implantable Pulse Generator Implant Date: 20180206
MDC IDC SESS DTM: 20190102233752

## 2017-04-16 ENCOUNTER — Ambulatory Visit (INDEPENDENT_AMBULATORY_CARE_PROVIDER_SITE_OTHER): Payer: Medicare Other | Admitting: *Deleted

## 2017-04-16 DIAGNOSIS — I480 Paroxysmal atrial fibrillation: Secondary | ICD-10-CM | POA: Diagnosis not present

## 2017-04-19 NOTE — Progress Notes (Signed)
Carelink Summary Report / Loop Recorder 

## 2017-04-27 LAB — CUP PACEART REMOTE DEVICE CHECK
Date Time Interrogation Session: 20190202003748
MDC IDC PG IMPLANT DT: 20180206

## 2017-05-14 ENCOUNTER — Other Ambulatory Visit: Payer: Self-pay | Admitting: Cardiology

## 2017-05-14 DIAGNOSIS — I480 Paroxysmal atrial fibrillation: Secondary | ICD-10-CM

## 2017-05-14 DIAGNOSIS — E785 Hyperlipidemia, unspecified: Secondary | ICD-10-CM

## 2017-05-14 DIAGNOSIS — I5033 Acute on chronic diastolic (congestive) heart failure: Secondary | ICD-10-CM

## 2017-05-19 ENCOUNTER — Ambulatory Visit (INDEPENDENT_AMBULATORY_CARE_PROVIDER_SITE_OTHER): Payer: Medicare Other | Admitting: *Deleted

## 2017-05-19 DIAGNOSIS — I480 Paroxysmal atrial fibrillation: Secondary | ICD-10-CM | POA: Diagnosis not present

## 2017-05-20 NOTE — Progress Notes (Signed)
Carelink Summary Report / Loop Recorder 

## 2017-06-21 ENCOUNTER — Ambulatory Visit (INDEPENDENT_AMBULATORY_CARE_PROVIDER_SITE_OTHER): Payer: Medicare Other | Admitting: *Deleted

## 2017-06-21 DIAGNOSIS — I480 Paroxysmal atrial fibrillation: Secondary | ICD-10-CM | POA: Diagnosis not present

## 2017-06-22 NOTE — Progress Notes (Signed)
Carelink Summary Report / Loop Recorder 

## 2017-07-01 LAB — CUP PACEART REMOTE DEVICE CHECK
Implantable Pulse Generator Implant Date: 20180206
MDC IDC SESS DTM: 20190307010919

## 2017-07-12 ENCOUNTER — Encounter: Payer: Self-pay | Admitting: Internal Medicine

## 2017-07-12 ENCOUNTER — Ambulatory Visit: Payer: Medicare Other | Admitting: Internal Medicine

## 2017-07-12 VITALS — BP 122/70 | HR 64 | Ht 72.0 in | Wt 243.0 lb

## 2017-07-12 DIAGNOSIS — G4733 Obstructive sleep apnea (adult) (pediatric): Secondary | ICD-10-CM | POA: Diagnosis not present

## 2017-07-12 DIAGNOSIS — I480 Paroxysmal atrial fibrillation: Secondary | ICD-10-CM

## 2017-07-12 DIAGNOSIS — E663 Overweight: Secondary | ICD-10-CM | POA: Diagnosis not present

## 2017-07-12 NOTE — Progress Notes (Signed)
PCP: Maury Dus, MD Primary Cardiologist: Dr Meda Coffee Primary EP: Dr Rayann Heman  Austin Taylor is a 77 y.o. male who presents today for routine electrophysiology followup.  Since last being seen in our clinic, the patient reports doing very well.  Today, he denies symptoms of palpitations, chest pain, shortness of breath,  lower extremity edema, dizziness, presyncope, or syncope.  The patient is otherwise without complaint today.   Past Medical History:  Diagnosis Date  . Chronic kidney disease    patient denies   . History of hiatal hernia   . Hyperlipidemia   . OSA on CPAP   . Persistent atrial fibrillation ([place])   . Primary cancer of skin of ear    "might have been on my left ear"  . 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 ATRIAL FIBRILLATION ABLATION  08/25/2016  . ATRIAL FIBRILLATION ABLATION N/A 08/25/2016   Procedure: Atrial Fibrillation Ablation;  Surgeon: [name], MD;  Location: [place] [facility];  Service: Cardiovascular;  Laterality: N/A;  . CARDIOVERSION N/A 09/09/2015   Procedure: CARDIOVERSION;  Surgeon: Fay Records, MD;  Location: [place];  Service: Cardiovascular;  Laterality: N/A;  . CARDIOVERSION N/A 11/20/2015   Procedure: CARDIOVERSION;  Surgeon: Pixie Casino, MD;  Location: [facility] ENDOSCOPY;  Service: Cardiovascular;  Laterality: N/A;  . LOOP RECORDER INSERTION N/A 04/21/2016   Procedure: Loop Recorder Insertion;  Surgeon: [name], MD;  Location: [place] CV LAB;  Service: Cardiovascular;  Laterality: N/A;  . MOHS SURGERY     ear  . SHOULDER OPEN ROTATOR CUFF REPAIR Left 1998  . TEE WITHOUT CARDIOVERSION N/A 08/25/2016   Procedure: TRANSESOPHAGEAL ECHOCARDIOGRAM (TEE);  Surgeon: Jerline Pain, MD;  Location: [facility] ENDOSCOPY;  Service: Cardiovascular;  Laterality: N/A;    ROS- all systems are reviewed and negatives except as per HPI above  Current  Outpatient Medications  Medication Sig Dispense Refill  . ELIQUIS 5 MG TABS tablet TAKE 1 TABLET BY MOUTH TWICE DAILY 60 tablet 11  . ferrous sulfate 325 (65 FE) MG tablet Take 325 mg by mouth every Monday, Wednesday, and Friday.     . furosemide (LASIX) 40 MG tablet TAKE 1 TABLET BY MOUTH DAILY 90 tablet 1  . Multiple Vitamins-Minerals (PRESERVISION AREDS 2 PO) Take 1 capsule by mouth 2 (two) times daily.    . sildenafil (REVATIO) 20 MG tablet Take 20 mg by mouth daily as needed (for ED). Take as directed    . simvastatin (ZOCOR) 40 MG tablet TAKE 1 TABLET BY MOUTH ONCE DAILY 30 tablet 10   No current facility-administered medications for this visit.     Physical Exam: Vitals:   07/12/17 1033  Pulse: 64  Weight: 243 lb (110.2 kg)  Height: 6' (1.829 m)    GEN- The patient is well appearing, alert and oriented x 3 today.   Head- normocephalic, atraumatic Eyes-  Sclera clear, conjunctiva pink Ears- hearing intact Oropharynx- clear Lungs- Clear to ausculation bilaterally, normal work of breathing Heart- Regular rate and rhythm, no murmurs, rubs or gallops, PMI not laterally displaced GI- soft, NT, ND, + BS Extremities- no clubbing, cyanosis, or edema  EKG tracing ordered today is personally reviewed and shows sinus rhythm, PACs, PR 272 msec  Assessment and Plan:  1. Persistent afib Well controlled s/p ablation off tikosyn ILR reveals no afib. chads2vasc score is 4.  Continue eliquis  2.  Obesity Body mass index is 32.96 kg/m. He is making progress  3. OSA Uses CPAP  Return in 6 months  [name] MD, [facility] 07/12/2017 10:47 AM

## 2017-07-12 NOTE — Patient Instructions (Signed)
Medication Instructions:  Your physician recommends that you continue on your current medications as directed. Please refer to the Current Medication list given to you today.  *If you need a refill on your cardiac medications before your next appointment, please call your pharmacy*  Labwork: None ordered  Testing/Procedures: None ordered  Follow-Up: Remote monitoring is used to monitor your Pacemaker or ICD from home. This monitoring reduces the number of office visits required to check your device to one time per year. It allows [place] to keep an eye on the functioning of your device to ensure it is working properly. You are scheduled for a device check from home on 07/26/2017. You may send your transmission at any time that day. If you have a wireless device, the transmission will be sent automatically. After your physician reviews your transmission, you will receive a postcard with your next transmission date.  Your physician wants you to follow-up in: 6 months with Dr. Rayann Heman.  You will receive a reminder letter in the mail two months in advance. If you don't receive a letter, please call our office to schedule the follow-up appointment.  Thank you for choosing [facility]!!

## 2017-07-26 ENCOUNTER — Ambulatory Visit (INDEPENDENT_AMBULATORY_CARE_PROVIDER_SITE_OTHER): Payer: Medicare Other | Admitting: *Deleted

## 2017-07-26 DIAGNOSIS — I481 Persistent atrial fibrillation: Secondary | ICD-10-CM | POA: Diagnosis not present

## 2017-07-26 DIAGNOSIS — I4819 Other persistent atrial fibrillation: Secondary | ICD-10-CM

## 2017-07-26 LAB — CUP PACEART REMOTE DEVICE CHECK
Implantable Pulse Generator Implant Date: 20180206
MDC IDC SESS DTM: 20190409013957

## 2017-07-26 NOTE — Progress Notes (Signed)
Carelink Summary Report / Loop Recorder 

## 2017-08-18 LAB — CUP PACEART REMOTE DEVICE CHECK
Date Time Interrogation Session: 20190512020954
MDC IDC PG IMPLANT DT: 20180206

## 2017-08-26 ENCOUNTER — Ambulatory Visit (INDEPENDENT_AMBULATORY_CARE_PROVIDER_SITE_OTHER): Payer: Medicare Other | Admitting: *Deleted

## 2017-08-26 DIAGNOSIS — I480 Paroxysmal atrial fibrillation: Secondary | ICD-10-CM | POA: Diagnosis not present

## 2017-08-27 NOTE — Progress Notes (Signed)
Carelink Summary Report / Loop Recorder 

## 2017-09-24 ENCOUNTER — Ambulatory Visit (INDEPENDENT_AMBULATORY_CARE_PROVIDER_SITE_OTHER): Payer: Medicare Other | Admitting: *Deleted

## 2017-09-24 DIAGNOSIS — I480 Paroxysmal atrial fibrillation: Secondary | ICD-10-CM

## 2017-09-29 DIAGNOSIS — I480 Paroxysmal atrial fibrillation: Secondary | ICD-10-CM | POA: Diagnosis not present

## 2017-09-29 NOTE — Progress Notes (Signed)
Carelink Summary Report / Loop Recorder 

## 2017-10-01 LAB — CUP PACEART REMOTE DEVICE CHECK
Date Time Interrogation Session: 20190614020535
MDC IDC PG IMPLANT DT: 20180206

## 2017-10-11 ENCOUNTER — Other Ambulatory Visit: Payer: Self-pay | Admitting: Internal Medicine

## 2017-10-11 DIAGNOSIS — I5033 Acute on chronic diastolic (congestive) heart failure: Secondary | ICD-10-CM

## 2017-10-11 DIAGNOSIS — E785 Hyperlipidemia, unspecified: Secondary | ICD-10-CM

## 2017-10-11 DIAGNOSIS — I480 Paroxysmal atrial fibrillation: Secondary | ICD-10-CM

## 2017-11-01 ENCOUNTER — Ambulatory Visit (INDEPENDENT_AMBULATORY_CARE_PROVIDER_SITE_OTHER): Payer: Medicare Other | Admitting: *Deleted

## 2017-11-01 DIAGNOSIS — I480 Paroxysmal atrial fibrillation: Secondary | ICD-10-CM | POA: Diagnosis not present

## 2017-11-02 NOTE — Progress Notes (Signed)
Carelink Summary Report / Loop Recorder 

## 2017-11-04 LAB — CUP PACEART REMOTE DEVICE CHECK
Implantable Pulse Generator Implant Date: 20180206
MDC IDC SESS DTM: 20190717023546

## 2017-11-19 ENCOUNTER — Other Ambulatory Visit: Payer: Self-pay | Admitting: Cardiology

## 2017-11-19 DIAGNOSIS — I480 Paroxysmal atrial fibrillation: Secondary | ICD-10-CM

## 2017-11-19 DIAGNOSIS — I5033 Acute on chronic diastolic (congestive) heart failure: Secondary | ICD-10-CM

## 2017-11-19 DIAGNOSIS — E785 Hyperlipidemia, unspecified: Secondary | ICD-10-CM

## 2017-12-06 ENCOUNTER — Ambulatory Visit (INDEPENDENT_AMBULATORY_CARE_PROVIDER_SITE_OTHER): Payer: Medicare Other | Admitting: *Deleted

## 2017-12-06 DIAGNOSIS — I480 Paroxysmal atrial fibrillation: Secondary | ICD-10-CM | POA: Diagnosis not present

## 2017-12-06 LAB — CUP PACEART REMOTE DEVICE CHECK
Date Time Interrogation Session: 20190819033922
MDC IDC PG IMPLANT DT: 20180206

## 2017-12-06 NOTE — Progress Notes (Signed)
Carelink Summary Report / Loop Recorder 

## 2017-12-13 LAB — CUP PACEART REMOTE DEVICE CHECK
Date Time Interrogation Session: 20190921034242
MDC IDC PG IMPLANT DT: 20180206

## 2018-01-06 ENCOUNTER — Ambulatory Visit (INDEPENDENT_AMBULATORY_CARE_PROVIDER_SITE_OTHER): Payer: Medicare Other | Admitting: *Deleted

## 2018-01-06 DIAGNOSIS — I480 Paroxysmal atrial fibrillation: Secondary | ICD-10-CM | POA: Diagnosis not present

## 2018-01-06 NOTE — Progress Notes (Signed)
Carelink Summary Report / Loop Recorder 

## 2018-01-21 LAB — CUP PACEART REMOTE DEVICE CHECK
MDC IDC PG IMPLANT DT: 20180206
MDC IDC SESS DTM: 20191024041020

## 2018-01-31 ENCOUNTER — Encounter: Payer: Self-pay | Admitting: Internal Medicine

## 2018-01-31 ENCOUNTER — Ambulatory Visit: Payer: Medicare Other | Admitting: Internal Medicine

## 2018-01-31 VITALS — BP 126/70 | HR 63 | Ht 72.0 in | Wt 237.8 lb

## 2018-01-31 DIAGNOSIS — G4733 Obstructive sleep apnea (adult) (pediatric): Secondary | ICD-10-CM | POA: Diagnosis not present

## 2018-01-31 DIAGNOSIS — E669 Obesity, unspecified: Secondary | ICD-10-CM

## 2018-01-31 DIAGNOSIS — I4819 Other persistent atrial fibrillation: Secondary | ICD-10-CM

## 2018-01-31 NOTE — Patient Instructions (Addendum)
Medication Instructions:  Your physician recommends that you continue on your current medications as directed. Please refer to the Current Medication list given to you today.   Labwork: None ordered   Testing/Procedures: None ordered   Follow-Up: Your physician wants you to follow-up in: 6 months with Dr. Allred    Any Other Special Instructions Will Be Listed Below (If Applicable).     If you need a refill on your cardiac medications before your next appointment, please call your pharmacy.   

## 2018-01-31 NOTE — Progress Notes (Signed)
PCP: Maury Dus, MD Primary Cardiologist: Dr Meda Coffee Primary EP: Dr Rayann Heman  Austin Taylor is a 77 y.o. male who presents today for routine electrophysiology followup.  Since last being seen in our clinic, the patient reports doing very well. He remains active.  He notices occasional SOB on incline.  Denies CP.  Today, he denies symptoms of palpitations, lower extremity edema, dizziness, presyncope, or syncope.  The patient is otherwise without complaint today.   Past Medical History:  Diagnosis Date  . Chronic kidney disease    patient denies   . History of hiatal hernia   . Hyperlipidemia   . OSA on CPAP   . Persistent atrial fibrillation   . Primary cancer of skin of ear    "might have been on my left ear"  . 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 ATRIAL FIBRILLATION ABLATION  08/25/2016  . ATRIAL FIBRILLATION ABLATION N/A 08/25/2016   Procedure: Atrial Fibrillation Ablation;  Surgeon: [name], MD;  Location: [place] [facility];  Service: Cardiovascular;  Laterality: N/A;  . CARDIOVERSION N/A 09/09/2015   Procedure: CARDIOVERSION;  Surgeon: Fay Records, MD;  Location: [place];  Service: Cardiovascular;  Laterality: N/A;  . CARDIOVERSION N/A 11/20/2015   Procedure: CARDIOVERSION;  Surgeon: Pixie Casino, MD;  Location: [facility] ENDOS[facility];  Service: Cardiovascular;  Laterality: N/A;  . LOOP RECORDER INSERTION N/A 04/21/2016   Procedure: Loop Recorder Insertion;  Surgeon: [name], MD;  Location: [place] [facility];  Service: Cardiovascular;  Laterality: N/A;  . MOHS SURGERY     ear  . SHOULDER OPEN ROTATOR CUFF REPAIR Left 1998  . TEE WITHOUT CARDIOVERSION N/A 08/25/2016   Procedure: TRANSESOPHAGEAL ECHOCARDIOGRAM (TEE);  Surgeon: Jerline Pain, MD;  Location: [facility] ENDOSCOPY;  Service: Cardiovascular;  Laterality: N/A;    ROS- all systems are reviewed and negatives except as  per HPI above  Current Outpatient Medications  Medication Sig Dispense Refill  . ELIQUIS 5 MG TABS tablet TAKE 1 TABLET BY MOUTH TWICE DAILY 60 tablet 5  . ferrous sulfate 325 (65 FE) MG tablet Take 325 mg by mouth every Monday, Wednesday, and Friday.     . furosemide (LASIX) 40 MG tablet TAKE 1 TABLET BY MOUTH DAILY 90 tablet 1  . Multiple Vitamins-Minerals (PRESERVISION AREDS 2 PO) Take 1 capsule by mouth 2 (two) times daily.    . sildenafil (REVATIO) 20 MG tablet Take 20 mg by mouth daily as needed (for ED). Take as directed    . simvastatin (ZOCOR) 40 MG tablet TAKE 1 TABLET BY MOUTH ONCE DAILY 30 tablet 10   No current facility-administered medications for this visit.     Physical Exam: Vitals:   01/31/18 0959  BP: 126/70  Pulse: 63  SpO2: 96%  Weight: 237 lb 12.8 oz (107.9 kg)  Height: 6' (1.829 m)    GEN- The patient is well appearing, alert and oriented x 3 today.   Head- normocephalic, atraumatic Eyes-  Sclera clear, conjunctiva pink Ears- hearing intact Oropharynx- clear Lungs- Clear to ausculation bilaterally, normal work of breathing Heart- Regular rate and rhythm, no murmurs, rubs or gallops, PMI not laterally displaced GI- soft, NT, ND, + BS Extremities- no clubbing, cyanosis, or edema  Wt Readings from Last 3 Encounters:  01/31/18 237 lb 12.8 oz (107.9 kg)  07/12/17 243 lb (110.2 kg)  03/01/17 244 lb 6.4 oz (110.9 kg)  EKG tracing ordered today is personally reviewed and shows sinus rhythm with mobitz I second degree AV block,   Assessment and Plan:  1. Persistent afib Doing well post ablation off AAD therapy post ablation afib burden is 1.8%, longest episode is 1 hour (11/21/17).  Most episodes appear to be sinus with mobitz I AV block rather than true afib Asymptomatic and rate controlled On eliquis for chads2vasc score of 4 (prior stroke)  2. Obesity Body mass index is 32.25 kg/m. Lifestyle modification is encouraged  3. OSA Uses CPAP  4.  Mobitz I second degree AV block Asymptomatic No pauses or brady events by ILR  [facility] Return to see me in 6 months unless problems arise  [name] MD, [facility] 01/31/2018 10:30 AM

## 2018-02-08 ENCOUNTER — Ambulatory Visit (INDEPENDENT_AMBULATORY_CARE_PROVIDER_SITE_OTHER): Payer: Medicare Other

## 2018-02-08 DIAGNOSIS — I4819 Other persistent atrial fibrillation: Secondary | ICD-10-CM | POA: Diagnosis not present

## 2018-02-08 NOTE — Progress Notes (Signed)
Carelink Summary Report / Loop Recorder 

## 2018-03-14 ENCOUNTER — Ambulatory Visit (INDEPENDENT_AMBULATORY_CARE_PROVIDER_SITE_OTHER): Payer: Medicare Other

## 2018-03-14 DIAGNOSIS — I440 Atrioventricular block, first degree: Secondary | ICD-10-CM

## 2018-03-14 DIAGNOSIS — I4819 Other persistent atrial fibrillation: Secondary | ICD-10-CM

## 2018-03-14 NOTE — Progress Notes (Signed)
Carelink Summary Report / Loop Recorder 

## 2018-03-15 LAB — CUP PACEART REMOTE DEVICE CHECK
Implantable Pulse Generator Implant Date: 20180206
MDC IDC SESS DTM: 20191229114018

## 2018-03-27 LAB — CUP PACEART REMOTE DEVICE CHECK
Date Time Interrogation Session: 20191126094028
MDC IDC PG IMPLANT DT: 20180206

## 2018-04-13 ENCOUNTER — Other Ambulatory Visit: Payer: Self-pay | Admitting: Internal Medicine

## 2018-04-13 DIAGNOSIS — I480 Paroxysmal atrial fibrillation: Secondary | ICD-10-CM

## 2018-04-13 DIAGNOSIS — I5033 Acute on chronic diastolic (congestive) heart failure: Secondary | ICD-10-CM

## 2018-04-13 DIAGNOSIS — E785 Hyperlipidemia, unspecified: Secondary | ICD-10-CM

## 2018-04-13 NOTE — Telephone Encounter (Signed)
Pt on correct Eliquis dose with age < 24 and weight > 60kg. Pt is overdue for BMET and CBC - added note to upcoming visit with Dr Rayann Heman in May to check labs then.

## 2018-04-15 ENCOUNTER — Ambulatory Visit (INDEPENDENT_AMBULATORY_CARE_PROVIDER_SITE_OTHER): Payer: Medicare Other

## 2018-04-15 DIAGNOSIS — I480 Paroxysmal atrial fibrillation: Secondary | ICD-10-CM

## 2018-04-15 LAB — CUP PACEART REMOTE DEVICE CHECK
Date Time Interrogation Session: 20200131113816
Implantable Pulse Generator Implant Date: 20180206

## 2018-04-22 NOTE — Progress Notes (Signed)
Carelink Summary Report / Loop Recorder 

## 2018-05-12 ENCOUNTER — Other Ambulatory Visit: Payer: Self-pay | Admitting: Cardiology

## 2018-05-12 DIAGNOSIS — I5033 Acute on chronic diastolic (congestive) heart failure: Secondary | ICD-10-CM

## 2018-05-12 DIAGNOSIS — E785 Hyperlipidemia, unspecified: Secondary | ICD-10-CM

## 2018-05-12 DIAGNOSIS — I480 Paroxysmal atrial fibrillation: Secondary | ICD-10-CM

## 2018-05-18 ENCOUNTER — Ambulatory Visit (INDEPENDENT_AMBULATORY_CARE_PROVIDER_SITE_OTHER): Payer: Medicare Other | Admitting: *Deleted

## 2018-05-18 DIAGNOSIS — I480 Paroxysmal atrial fibrillation: Secondary | ICD-10-CM | POA: Diagnosis not present

## 2018-05-20 LAB — CUP PACEART REMOTE DEVICE CHECK
Date Time Interrogation Session: 20200304113657
Implantable Pulse Generator Implant Date: 20180206

## 2018-05-25 NOTE — Progress Notes (Signed)
Carelink Summary Report / Loop Recorder 

## 2018-06-16 ENCOUNTER — Other Ambulatory Visit: Payer: Self-pay | Admitting: Cardiology

## 2018-06-16 DIAGNOSIS — I5033 Acute on chronic diastolic (congestive) heart failure: Secondary | ICD-10-CM

## 2018-06-16 DIAGNOSIS — I480 Paroxysmal atrial fibrillation: Secondary | ICD-10-CM

## 2018-06-16 DIAGNOSIS — E785 Hyperlipidemia, unspecified: Secondary | ICD-10-CM

## 2018-06-20 ENCOUNTER — Ambulatory Visit (INDEPENDENT_AMBULATORY_CARE_PROVIDER_SITE_OTHER): Payer: Medicare Other | Admitting: *Deleted

## 2018-06-20 ENCOUNTER — Other Ambulatory Visit: Payer: Self-pay

## 2018-06-20 DIAGNOSIS — I480 Paroxysmal atrial fibrillation: Secondary | ICD-10-CM

## 2018-06-20 LAB — CUP PACEART REMOTE DEVICE CHECK
Date Time Interrogation Session: 20200406134140
Implantable Pulse Generator Implant Date: 20180206

## 2018-06-28 NOTE — Progress Notes (Signed)
Carelink Summary Report / Loop Recorder 

## 2018-07-25 ENCOUNTER — Ambulatory Visit (INDEPENDENT_AMBULATORY_CARE_PROVIDER_SITE_OTHER): Payer: Medicare Other | Admitting: *Deleted

## 2018-07-25 ENCOUNTER — Other Ambulatory Visit: Payer: Self-pay

## 2018-07-25 DIAGNOSIS — I480 Paroxysmal atrial fibrillation: Secondary | ICD-10-CM | POA: Diagnosis not present

## 2018-07-25 LAB — CUP PACEART REMOTE DEVICE CHECK
Date Time Interrogation Session: 20200509140607
Implantable Pulse Generator Implant Date: 20180206

## 2018-07-27 ENCOUNTER — Telehealth: Payer: Self-pay

## 2018-07-27 NOTE — Telephone Encounter (Signed)
Left message regarding appt on 08/01/18. 

## 2018-08-01 ENCOUNTER — Encounter: Payer: Self-pay | Admitting: Internal Medicine

## 2018-08-01 ENCOUNTER — Telehealth (INDEPENDENT_AMBULATORY_CARE_PROVIDER_SITE_OTHER): Payer: Medicare Other | Admitting: Internal Medicine

## 2018-08-01 VITALS — BP 124/81 | HR 69 | Ht 72.0 in | Wt 235.4 lb

## 2018-08-01 DIAGNOSIS — G4733 Obstructive sleep apnea (adult) (pediatric): Secondary | ICD-10-CM | POA: Diagnosis not present

## 2018-08-01 DIAGNOSIS — E669 Obesity, unspecified: Secondary | ICD-10-CM

## 2018-08-01 DIAGNOSIS — I4819 Other persistent atrial fibrillation: Secondary | ICD-10-CM

## 2018-08-01 NOTE — Progress Notes (Signed)
Electrophysiology TeleHealth Note   Due to national recommendations of social distancing due to COVID 19, an audio/video telehealth visit is felt to be most appropriate for this patient at this time.  See MyChart message from today for the patient's consent to telehealth for [facility].   Date:  08/01/2018   ID:  Austin Taylor, DOB 1940/04/07, MRN [id number]  Location: patient's home  Provider location: [address] [place]  Evaluation Performed: Follow-up visit  PCP:  Maury Dus, MD  Cardiologist:  Meda Coffee Electrophysiologist:  Dr Rayann Heman  Chief Complaint:  afib  History of Present Illness:    Austin Taylor is a 78 y.o. male who presents via audio/video conferencing for a telehealth visit today.  Since last being seen in our clinic, the patient reports doing very well.  He has SOB with moderate activity which is stable.  Still mowing and gardening.  Today, he denies symptoms of palpitations, chest pain, lower extremity edema, dizziness, presyncope, or syncope.  The patient is otherwise without complaint today.  The patient denies symptoms of fevers, chills, cough, or new SOB worrisome for COVID 19.  Past Medical History:  Diagnosis Date  . Chronic kidney disease    patient denies   . History of hiatal hernia   . Hyperlipidemia   . OSA on CPAP   . Persistent atrial fibrillation   . Primary cancer of skin of ear    "might have been on my left ear"  . 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 ATRIAL FIBRILLATION ABLATION  08/25/2016  . ATRIAL FIBRILLATION ABLATION N/A 08/25/2016   Procedure: Atrial Fibrillation Ablation;  Surgeon: [name], MD;  Location: [place] [facility];  Service: Cardiovascular;  Laterality: N/A;  . CARDIOVERSION N/A 09/09/2015   Procedure: CARDIOVERSION;  Surgeon: Fay Records, MD;  Location: [place];  Service: Cardiovascular;   Laterality: N/A;  . CARDIOVERSION N/A 11/20/2015   Procedure: CARDIOVERSION;  Surgeon: Pixie Casino, MD;  Location: [facility] ENDOSCOPY;  Service: Cardiovascular;  Laterality: N/A;  . LOOP RECORDER INSERTION N/A 04/21/2016   Procedure: Loop Recorder Insertion;  Surgeon: [name], MD;  Location: [place] [facility];  Service: Cardiovascular;  Laterality: N/A;  . MOHS SURGERY     ear  . SHOULDER OPEN ROTATOR CUFF REPAIR Left 1998  . TEE WITHOUT CARDIOVERSION N/A 08/25/2016   Procedure: TRANSESOPHAGEAL ECHOCARDIOGRAM (TEE);  Surgeon: Jerline Pain, MD;  Location: [facility] ENDOSCOPY;  Service: Cardiovascular;  Laterality: N/A;    Current Outpatient Medications  Medication Sig Dispense Refill  . ELIQUIS 5 MG TABS tablet TAKE 1 TABLET BY MOUTH TWICE DAILY 60 tablet 5  . ferrous sulfate 325 (65 FE) MG tablet Take 325 mg by mouth every Monday, Wednesday, and Friday.     . furosemide (LASIX) 40 MG tablet TAKE 1 TABLET BY MOUTH DAILY 90 tablet 1  . Multiple Vitamins-Minerals (PRESERVISION AREDS 2 PO) Take 1 capsule by mouth 2 (two) times daily.    . sildenafil (REVATIO) 20 MG tablet Take 20 mg by mouth daily as needed (for ED). Take as directed    . simvastatin (ZOCOR) 40 MG tablet TAKE 1 TABLET BY MOUTH ONCE DAILY 30 tablet 10   No current facility-administered medications for this visit.     Allergies:   Codeine   Social History:  The patient  reports that he quit smoking  about 44 years ago. His smoking use included cigarettes. He has a 17.00 pack-year smoking history. He quit smokeless tobacco use about 43 years ago. He reports current alcohol use of about 2.0 standard drinks of alcohol per week. He reports that he does not use drugs.   Family History:  The patient's  family history includes Heart disease in his brother, father, and mother; Other in his father.   ROS:  Please see the history of present illness.   All other systems are personally reviewed and negative.    Exam:    Vital Signs:  BP  124/81   Pulse 69   Ht 6' (1.829 m)   Wt 235 lb 6.4 oz (106.8 kg)   BMI 31.93 kg/m   Well appearing, alert and conversant, regular work of breathing,  good skin color Eyes- anicteric, neuro- grossly intact, skin- no apparent rash or lesions or cyanosis, mouth- oral mucosa is pink   Labs/Other Tests and Data Reviewed:    Recent Labs: No results found for requested labs within last 8760 hours.   Wt Readings from Last 3 Encounters:  08/01/18 235 lb 6.4 oz (106.8 kg)  01/31/18 237 lb 12.8 oz (107.9 kg)  07/12/17 243 lb (110.2 kg)     Other studies personally reviewed: Additional studies/ records that were reviewed today include: my prior notes  Review of the above records today demonstrates: as above   Last device remote is reviewed from [facility] PDF dated 07/23/2018 which reveals normal device function, mobitz 1 second degree AV block,  Very low AF burden   ASSESSMENT & PLAN:    1.  Persistent afib Doing very well AF burden by ILR is 4% however most of these episodes appear to be sinus with mobitz I second degree AV  Lock Continue eliquis  2. Mobitz I second degree AV block Avoid av nodal agents in the future Continue to follow clinically No prolonged pauses by ILR  3. Obesity Lifestyle modification discussed today  4 OSA Uses CPAP  5. COVID 19 screen The patient denies symptoms of COVID 19 at this time.  The importance of social distancing was discussed today.  Follow-up:  6 months Next remote: 08/2018  Current medicines are reviewed at length with the patient today.   The patient does not have concerns regarding his medicines.  The following changes were made today:  none  Labs/ tests ordered today include:  No orders of the defined types were placed in this encounter.  Patient Risk:  after full review of this patients clinical status, I feel that they are at moderate risk at this time.  Today, I have spent 15 minutes with the patient with telehealth technology  discussing afib .    Army Fossa, MD  08/01/2018 9:32 AM     [facility] [address] [place] [address] [phone] (office) [phone] (fax)

## 2018-08-02 NOTE — Progress Notes (Signed)
Carelink Summary Report / Loop Recorder 

## 2018-08-25 ENCOUNTER — Ambulatory Visit (INDEPENDENT_AMBULATORY_CARE_PROVIDER_SITE_OTHER): Payer: Medicare Other | Admitting: *Deleted

## 2018-08-25 DIAGNOSIS — I4819 Other persistent atrial fibrillation: Secondary | ICD-10-CM

## 2018-08-25 LAB — CUP PACEART REMOTE DEVICE CHECK
Date Time Interrogation Session: 20200611130320
Implantable Pulse Generator Implant Date: 20180206

## 2018-08-31 NOTE — Progress Notes (Signed)
Carelink Summary Report / Loop Recorder 

## 2018-09-13 ENCOUNTER — Telehealth: Payer: Self-pay

## 2018-09-13 NOTE — Telephone Encounter (Signed)
Transmission reviewed. Available ECGs appear to be SR w/ PACs and intermittent Mobitz I. Called pt to let him know that transmission was received and reviewed.

## 2018-09-13 NOTE — Telephone Encounter (Signed)
I spoke with pt and asked him to send a manual transmission with his home monitor. I told him I will keep an eye out for the transmission. I told him I will have the nurse look at it and give him a call back. Transmission received.

## 2018-09-19 ENCOUNTER — Telehealth: Payer: Self-pay

## 2018-09-19 NOTE — Telephone Encounter (Signed)
Will discuss with Dr. Rayann Heman on 09/22/18. Pt set to "longest episode only" for AF alerts. Available EGMs appear to be Mobitz I AV block. No change for now.

## 2018-09-19 NOTE — Telephone Encounter (Signed)
I spoke with pt and he did send the manual transmission. Transmission received 09-19-2018 at 10:17 am.

## 2018-09-19 NOTE — Telephone Encounter (Signed)
LMOVM for pt to send a manual transmission with his home monitor. I also left my direct office number if the pt has any questions.

## 2018-09-21 ENCOUNTER — Telehealth: Payer: Self-pay | Admitting: Student

## 2018-09-21 NOTE — Telephone Encounter (Signed)
Discussed with patient. To come in tomorrow am for ILR reprogramming. Pt has no questions.   Pt answered NO to COVID screening questions. ( No contacts with known or pending testing individuals, no symptoms)  Lollie Marrow, PA-C 09/21/2018 4:50 PM

## 2018-09-22 ENCOUNTER — Other Ambulatory Visit: Payer: Self-pay

## 2018-09-22 ENCOUNTER — Ambulatory Visit (INDEPENDENT_AMBULATORY_CARE_PROVIDER_SITE_OTHER): Payer: Medicare Other | Admitting: Student

## 2018-09-22 DIAGNOSIS — I4819 Other persistent atrial fibrillation: Secondary | ICD-10-CM

## 2018-09-22 DIAGNOSIS — I480 Paroxysmal atrial fibrillation: Secondary | ICD-10-CM

## 2018-09-22 LAB — CUP PACEART REMOTE DEVICE CHECK
Date Time Interrogation Session: 20200709040500
Implantable Pulse Generator Implant Date: 20180206

## 2018-09-22 LAB — CUP PACEART INCLINIC DEVICE CHECK
Date Time Interrogation Session: 20200709114937
Implantable Pulse Generator Implant Date: 20180206

## 2018-09-22 NOTE — Progress Notes (Signed)
Loop check in clinic. Battery status: OK. R-waves 0.22mV. 48 AF episodes (14.3% burden), occasionally true, known and on [place], but most thought to be NSR with ectopy. Re-programmed to "Less sensitive" with aggressive ectopy rejection. Monthly summary reports and ROV with Dr. Rayann Heman 01/16/2019.   Legrand Como [address]" [place], PA-C 09/22/2018 11:50 AM

## 2018-09-27 ENCOUNTER — Ambulatory Visit (INDEPENDENT_AMBULATORY_CARE_PROVIDER_SITE_OTHER): Payer: Medicare Other | Admitting: *Deleted

## 2018-09-27 DIAGNOSIS — I480 Paroxysmal atrial fibrillation: Secondary | ICD-10-CM

## 2018-09-27 LAB — CUP PACEART REMOTE DEVICE CHECK
Date Time Interrogation Session: 20200714121551
Implantable Pulse Generator Implant Date: 20180206

## 2018-10-07 ENCOUNTER — Encounter (HOSPITAL_COMMUNITY): Payer: Self-pay | Admitting: *Deleted

## 2018-10-07 ENCOUNTER — Emergency Department (HOSPITAL_COMMUNITY): Payer: Medicare Other

## 2018-10-07 ENCOUNTER — Emergency Department (HOSPITAL_COMMUNITY)
Admission: EM | Admit: 2018-10-07 | Discharge: 2018-10-07 | Disposition: A | Discharge status: Home / Self Care | Payer: Medicare Other | Attending: Emergency Medicine | Admitting: Emergency Medicine

## 2018-10-07 ENCOUNTER — Other Ambulatory Visit: Payer: Self-pay

## 2018-10-07 DIAGNOSIS — R911 Solitary pulmonary nodule: Secondary | ICD-10-CM | POA: Diagnosis not present

## 2018-10-07 DIAGNOSIS — Z8673 Personal history of transient ischemic attack (TIA), and cerebral infarction without residual deficits: Secondary | ICD-10-CM | POA: Diagnosis not present

## 2018-10-07 DIAGNOSIS — K8080 Other cholelithiasis without obstruction: Secondary | ICD-10-CM | POA: Diagnosis not present

## 2018-10-07 DIAGNOSIS — R509 Fever, unspecified: Secondary | ICD-10-CM | POA: Insufficient documentation

## 2018-10-07 DIAGNOSIS — Z87891 Personal history of nicotine dependence: Secondary | ICD-10-CM | POA: Insufficient documentation

## 2018-10-07 DIAGNOSIS — I5032 Chronic diastolic (congestive) heart failure: Secondary | ICD-10-CM | POA: Insufficient documentation

## 2018-10-07 DIAGNOSIS — Z7901 Long term (current) use of anticoagulants: Secondary | ICD-10-CM | POA: Insufficient documentation

## 2018-10-07 DIAGNOSIS — I130 Hypertensive heart and chronic kidney disease with heart failure and stage 1 through stage 4 chronic kidney disease, or unspecified chronic kidney disease: Secondary | ICD-10-CM | POA: Diagnosis not present

## 2018-10-07 DIAGNOSIS — Z79899 Other long term (current) drug therapy: Secondary | ICD-10-CM | POA: Insufficient documentation

## 2018-10-07 DIAGNOSIS — K5792 Diverticulitis of intestine, part unspecified, without perforation or abscess without bleeding: Secondary | ICD-10-CM | POA: Diagnosis not present

## 2018-10-07 DIAGNOSIS — K449 Diaphragmatic hernia without obstruction or gangrene: Secondary | ICD-10-CM | POA: Diagnosis not present

## 2018-10-07 DIAGNOSIS — N182 Chronic kidney disease, stage 2 (mild): Secondary | ICD-10-CM | POA: Insufficient documentation

## 2018-10-07 DIAGNOSIS — Z20822 Contact with and (suspected) exposure to covid-19: Secondary | ICD-10-CM

## 2018-10-07 DIAGNOSIS — Z85828 Personal history of other malignant neoplasm of skin: Secondary | ICD-10-CM | POA: Diagnosis not present

## 2018-10-07 DIAGNOSIS — Z20828 Contact with and (suspected) exposure to other viral communicable diseases: Secondary | ICD-10-CM | POA: Insufficient documentation

## 2018-10-07 LAB — COMPREHENSIVE METABOLIC PANEL
ALT: 36 U/L (ref 0–44)
AST: 43 U/L — ABNORMAL HIGH (ref 15–41)
Albumin: 3.8 g/dL (ref 3.5–5.0)
Alkaline Phosphatase: 91 U/L (ref 38–126)
Anion gap: 10 (ref 5–15)
BUN: 17 mg/dL (ref 8–23)
CO2: 25 mmol/L (ref 22–32)
Calcium: 8.9 mg/dL (ref 8.9–10.3)
Chloride: 103 mmol/L (ref 98–111)
Creatinine, Ser: 1.05 mg/dL (ref 0.61–1.24)
GFR calc Af Amer: >60 mL/min (ref >60)
GFR calc non Af Amer: >60 mL/min (ref >60)
Glucose, Bld: 119 mg/dL — ABNORMAL HIGH (ref 70–99)
Potassium: 4.4 mmol/L (ref 3.5–5.1)
Sodium: 138 mmol/L (ref 135–145)
Total Bilirubin: 1 mg/dL (ref 0.3–1.2)
Total Protein: 7 g/dL (ref 6.5–8.1)

## 2018-10-07 LAB — CBC WITH DIFFERENTIAL/PLATELET
Abs Immature Granulocytes: 0.06 10*3/uL (ref 0.00–0.07)
Basophils Absolute: 0 10*3/uL (ref 0.0–0.1)
Basophils Relative: 0 %
Eosinophils Absolute: 0 10*3/uL (ref 0.0–0.5)
Eosinophils Relative: 0 %
HCT: 44.7 % (ref 39.0–52.0)
Hemoglobin: 14.7 g/dL (ref 13.0–17.0)
Immature Granulocytes: 0 %
Lymphocytes Relative: 6 %
Lymphs Abs: 0.8 10*3/uL (ref 0.7–4.0)
MCH: 30.5 pg (ref 26.0–34.0)
MCHC: 32.9 g/dL (ref 30.0–36.0)
MCV: 92.7 fL (ref 80.0–100.0)
Monocytes Absolute: 0.8 10*3/uL (ref 0.1–1.0)
Monocytes Relative: 5 %
Neutro Abs: 12.4 10*3/uL — ABNORMAL HIGH (ref 1.7–7.7)
Neutrophils Relative %: 89 %
Platelets: 204 10*3/uL (ref 150–400)
RBC: 4.82 MIL/uL (ref 4.22–5.81)
RDW: 13.2 % (ref 11.5–15.5)
WBC: 14 10*3/uL — ABNORMAL HIGH (ref 4.0–10.5)
nRBC: 0 % (ref 0.0–0.2)

## 2018-10-07 LAB — URINALYSIS, ROUTINE W REFLEX MICROSCOPIC
Bilirubin Urine: NEGATIVE
Glucose, UA: NEGATIVE mg/dL
Hgb urine dipstick: NEGATIVE
Ketones, ur: NEGATIVE mg/dL
Leukocytes,Ua: NEGATIVE
Nitrite: NEGATIVE
Protein, ur: NEGATIVE mg/dL
Specific Gravity, Urine: 1.029 (ref 1.005–1.030)
pH: 5 (ref 5.0–8.0)

## 2018-10-07 LAB — LIPASE, BLOOD: Lipase: 34 U/L (ref 11–51)

## 2018-10-07 MED ORDER — IBUPROFEN 200 MG PO TABS
600.0000 mg | ORAL_TABLET | Freq: Once | ORAL | Status: AC
  Administered 2018-10-07: 20:00:00 600 mg via ORAL
  Filled 2018-10-07: qty 3

## 2018-10-07 MED ORDER — SODIUM CHLORIDE (PF) 0.9 % IJ SOLN
INTRAMUSCULAR | Status: AC
  Filled 2018-10-07: qty 50

## 2018-10-07 MED ORDER — IOHEXOL 300 MG/ML  SOLN
100.0000 mL | Freq: Once | INTRAMUSCULAR | Status: AC | PRN
  Administered 2018-10-07: 22:00:00 100 mL via INTRAVENOUS

## 2018-10-07 MED ORDER — AMOXICILLIN-POT CLAVULANATE 875-125 MG PO TABS: 1.0000 | ORAL_TABLET | Freq: Two times a day (BID) | ORAL | 0 refills | Status: AC

## 2018-10-07 NOTE — ED Triage Notes (Signed)
Pt complains of body aches and fever since lunch today. Pt took tylenol at 3PM. Pt denies sick contacts. Pt is concerned he has COVID 19.

## 2018-10-07 NOTE — ED Provider Notes (Signed)
[facility] DEPT Provider Note   CSN: [id number] Arrival date & time: 10/07/18  1704    History   Chief Complaint Chief Complaint  Patient presents with   Fever   Generalized Body Aches    HPI Austin Taylor is a 78 y.o. male.     HPI   Patient is a 78 year old male with history of CKD, hiatal hernia, hyperlipidemia, OSA, persistent A. fib, CVA, who presents the emergency department today for evaluation of fever.  Patient's wife is at bedside and assist with a history.  She states that the patient began to have body aches and a fever this afternoon.  His temperature was greater than 102F.  She gave him Tylenol around 3 PM.  He was also having some body aches and a headache which got better after taking Tylenol.  He denies any chest pain, cough, shortness of breath.  No rhinorrhea, nasal congestion, sore throats.  He had some mild dull lower abdominal discomfort.  Denies any nausea, vomiting, diarrhea, dysuria, frequency urgency or hematuria.  Denies any known sick contacts or COVID exposures.  They state that they have been trying to social distance as much as possible.  They have been going to church but have been socially distance while at church.  They have also gone to the grocery store about once per week since [place] started.  He is requesting COVID testing.  Past Medical History:  Diagnosis Date   Chronic kidney disease    patient denies    History of hiatal hernia    Hyperlipidemia    OSA on CPAP    Persistent atrial fibrillation    Primary cancer of skin of ear    "might have been on my left ear"   Stroke (cerebrum) ([place]) 12/2013   "small one"; denies residual on 08/25/2016    Patient Active Problem List   Diagnosis Date Noted   A-fib ([place]) 08/25/2016   Persistent atrial fibrillation    Pre-procedure lab exam 09/04/2015   Chronic diastolic CHF (congestive heart failure) ([place]) 03/28/2015   Paroxysmal atrial fibrillation  ([place]) 02/12/2014   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 ([place]) 01/30/2014   HLD (hyperlipidemia) 01/30/2014   Acute sinusitis 01/15/2014   Cough 01/15/2014   Atrial flutter ([place]) 12/29/2013   Essential hypertension 95/63/8756   Diastolic dysfunction without heart failure 12/29/2013   Anticoagulation management encounter 12/29/2013   Dizziness 12/27/2013   Cerebral thrombosis with cerebral infarction ([place]) 12/27/2013   Hyperlipidemia 12/27/2013   GERD (gastroesophageal reflux disease) 12/27/2013   CKD (chronic kidney disease) stage 2, GFR 60-89 ml/min 12/27/2013   Cerebellar infarct ([place]) 12/27/2013   Obesity (BMI 30-39.9) 12/27/2013    Past Surgical History:  Procedure Laterality Date   ATRIAL FIBRILLATION ABLATION  08/25/2016   ATRIAL FIBRILLATION ABLATION N/A 08/25/2016   Procedure: Atrial Fibrillation Ablation;  Surgeon: [name], MD;  Location: [facility];  Service: Cardiovascular;  Laterality: N/A;   CARDIOVERSION N/A 09/09/2015   Procedure: CARDIOVERSION;  Surgeon: Fay Records, MD;  Location: [facility];  Service: Cardiovascular;  Laterality: N/A;   CARDIOVERSION N/A 11/20/2015   Procedure: CARDIOVERSION;  Surgeon: Pixie Casino, MD;  Location: [facility];  Service: Cardiovascular;  Laterality: N/A;   LOOP RECORDER INSERTION N/A 04/21/2016   Procedure: Loop Recorder Insertion;  Surgeon: [name], MD;  Location: [facility];  Service: Cardiovascular;  Laterality: N/A;   MOHS SURGERY     ear  SHOULDER OPEN ROTATOR CUFF REPAIR Left 1998   TEE WITHOUT CARDIOVERSION N/A 08/25/2016   Procedure: TRANSESOPHAGEAL ECHOCARDIOGRAM (TEE);  Surgeon: Jerline Pain, MD;  Location: [facility] ENDOSCOPY;  Service: Cardiovascular;  Laterality: N/A;        Home Medications    Prior to Admission medications   Medication Sig Start Date End Date Taking? Authorizing Provider  amoxicillin-clavulanate (AUGMENTIN) 875-125 MG  tablet Take 1 tablet by mouth 2 (two) times daily for 7 days. 10/07/18 10/14/18  Rilya Longo S, PA-C  ELIQUIS 5 MG TABS tablet TAKE 1 TABLET BY MOUTH TWICE DAILY 04/13/18   Allred, Jeneen Rinks, MD  ferrous sulfate 325 (65 FE) MG tablet Take 325 mg by mouth every Monday, Wednesday, and Friday.     [provider]  furosemide (LASIX) 40 MG tablet TAKE 1 TABLET BY MOUTH DAILY 06/16/18   Dorothy Spark, MD  Multiple Vitamins-Minerals (PRESERVISION AREDS 2 PO) Take 1 capsule by mouth 2 (two) times daily.    [provider]  sildenafil (REVATIO) 20 MG tablet Take 20 mg by mouth daily as needed (for ED). Take as directed    [provider]  simvastatin (ZOCOR) 40 MG tablet TAKE 1 TABLET BY MOUTH ONCE DAILY 11/13/15   Dorothy Spark, MD    Family History Family History  Problem Relation Age of Onset   Heart disease Mother    Heart disease Father    Other Father        Carotid Artery Stenosis   Heart disease Brother     Social History Social History   Tobacco Use   Smoking status: Former Smoker    Packs/day: 1.00    Years: 17.00    Pack years: 17.00    Types: Cigarettes    Quit date: 08/02/1974    Years since quitting: 44.2   Smokeless tobacco: Former Systems developer    Quit date: 1977  Substance Use Topics   Alcohol use: Yes    Alcohol/week: 2.0 standard drinks    Types: 2 Cans of beer per week   Drug use: No     Allergies   Codeine   Review of Systems Review of Systems  Constitutional: Positive for chills, diaphoresis and fever.  HENT: Negative for congestion, ear pain, rhinorrhea and sore throat.   Eyes: Negative for visual disturbance.  Respiratory: Negative for cough and shortness of breath.   Cardiovascular: Negative for chest pain.  Gastrointestinal: Positive for abdominal pain. Negative for constipation, diarrhea, nausea and vomiting.  Genitourinary: Negative for dysuria and hematuria.  Musculoskeletal: Positive for myalgias.  Skin: Negative  for rash.  Neurological: Positive for headaches (resolved). Negative for dizziness, weakness, light-headedness and numbness.  All other systems reviewed and are negative.    Physical Exam Updated Vital Signs BP 107/69 (BP Location: Right Arm)    Pulse 79    Temp 98.8 F (37.1 C)    Resp 18    SpO2 94%   Physical Exam Vitals signs and nursing note reviewed.  Constitutional:      General: He is not in acute distress.    Appearance: He is well-developed. He is not ill-appearing or toxic-appearing.  HENT:     Head: Normocephalic and atraumatic.     Mouth/Throat:     Mouth: Mucous membranes are moist.  Eyes:     Conjunctiva/sclera: Conjunctivae normal.  Neck:     Musculoskeletal: Neck supple.  Cardiovascular:     Rate and Rhythm: Normal rate and regular rhythm.  Pulses: Normal pulses.     Heart sounds: Normal heart sounds. No murmur.  Pulmonary:     Effort: Pulmonary effort is normal. No respiratory distress.     Breath sounds: Normal breath sounds. No wheezing, rhonchi or rales.  Abdominal:     General: Bowel sounds are normal. There is no distension.     Palpations: Abdomen is soft.     Tenderness: There is no abdominal tenderness. There is no guarding or rebound.  Skin:    General: Skin is warm and dry.  Neurological:     Mental Status: He is alert.     Comments: Alert, oriented, clear speech, moving all extremities, ambulatory with steady gait.     ED Treatments / Results  Labs (all labs ordered are listed, but only abnormal results are displayed) Labs Reviewed  CBC WITH DIFFERENTIAL/PLATELET - Abnormal; Notable for the following components:      Result Value   WBC 14.0 (*)    Neutro Abs 12.4 (*)    All other components within normal limits  COMPREHENSIVE METABOLIC PANEL - Abnormal; Notable for the following components:   Glucose, Bld 119 (*)    AST 43 (*)    All other components within normal limits  URINALYSIS, ROUTINE W REFLEX MICROSCOPIC - Abnormal;  Notable for the following components:   APPearance HAZY (*)    All other components within normal limits  URINE CULTURE  NOVEL CORONAVIRUS, NAA (HOSPITAL ORDER, SEND-OUT TO REF LAB)  LIPASE, BLOOD    EKG None  Radiology Ct Abdomen Pelvis W Contrast  Result Date: 10/07/2018 CLINICAL DATA:  Fever with lower abdominal pain. EXAM: CT ABDOMEN AND PELVIS WITH CONTRAST TECHNIQUE: Multidetector CT imaging of the abdomen and pelvis was performed using the standard protocol following bolus administration of intravenous contrast. CONTRAST:  11mL OMNIPAQUE IOHEXOL 300 MG/ML  SOLN COMPARISON:  None. FINDINGS: Lower chest: There is a 5 mm pulmonary nodule in the right middle lobe (axial series 4, image 21). There is scarring versus atelectasis involving the left lower lobe.The heart size is normal. Hepatobiliary: The liver is normal. Cholelithiasis without acute inflammation.There is no biliary ductal dilation. Pancreas: Normal contours without ductal dilatation. No peripancreatic fluid collection. Spleen: No splenic laceration or hematoma. Adrenals/Urinary Tract: --Adrenal glands: No adrenal hemorrhage. --Right kidney/ureter: There appears to be an at least partially duplicated right-sided collecting system. There is no right-sided hydronephrosis. --Left kidney/ureter: No hydronephrosis or perinephric hematoma. --Urinary bladder: Unremarkable. Stomach/Bowel: --Stomach/Duodenum: There is a large hiatal hernia with the majority of the stomach herniated into the thoracic cavity. --Small bowel: No dilatation or inflammation. --Colon: There is severe sigmoid diverticulosis. There is some mild inflammation adjacent to the proximal sigmoid colon (axial series 2, image 66). --Appendix: Normal. Vascular/Lymphatic: Atherosclerotic calcification is present within the non-aneurysmal abdominal aorta, without hemodynamically significant stenosis. --No retroperitoneal lymphadenopathy. --there are tiny but prominent mesenteric  lymph nodes. --No pelvic or inguinal lymphadenopathy. Reproductive: The prostate gland is enlarged. Other: No ascites or free air. There are bilateral fat containing inguinal hernias, left greater than right. Musculoskeletal. No acute displaced fractures. IMPRESSION: 1. Severe sigmoid diverticulosis with possible early sigmoid diverticulitis in the appropriate clinical setting. 2. There is cholelithiasis without secondary signs of acute cholecystitis. 3. Large hiatal hernia with the majority of the stomach herniated into the thoracic cavity. This hernia also contains a small portion of the transverse colon. 4. 5 mm pulmonary nodule in the right middle lobe. No follow-up needed if patient is low-risk. Non-contrast chest CT  can be considered in 12 months if patient is high-risk. This recommendation follows the consensus statement: Guidelines for Management of Incidental Pulmonary Nodules Detected on CT Images: From the Fleischner Society 2017; Radiology 2017; 284:228-243. Aortic Atherosclerosis (ICD10-I70.0). Electronically Signed   By: Constance Holster M.D.   On: 10/07/2018 22:51   Dg Chest Portable 1 View  Result Date: 10/07/2018 CLINICAL DATA:  78 year old male with body aches and fever. EXAM: PORTABLE CHEST 1 VIEW COMPARISON:  Chest radiograph dated 12/27/2013 FINDINGS: Minimal left lung base atelectasis. No consolidative changes. No pleural effusion or pneumothorax. Stable cardiac silhouette. A loop recorder device noted. There is a moderate size hiatal hernia which appears to contain a loop of small bowel, possibly a portion of the transverse colon or small bowel. This is similar to prior radiograph. No acute osseous pathology. IMPRESSION: 1. No acute cardiopulmonary process. 2. Hiatal hernia similar to prior radiograph. Electronically Signed   By: Anner Crete M.D.   On: 10/07/2018 19:55    Procedures Procedures (including critical care time)  Medications Ordered in ED Medications  sodium  chloride (PF) 0.9 % injection (has no administration in time range)  ibuprofen (ADVIL) tablet 600 mg (600 mg Oral Given 10/07/18 1958)  iohexol (OMNIPAQUE) 300 MG/ML solution 100 mL (100 mLs Intravenous Contrast Given 10/07/18 2218)     Initial Impression / Assessment and Plan / ED Course  I have reviewed the triage vital signs and the nursing notes.  Pertinent labs & imaging results that were available during my care of the patient were reviewed by me and considered in my medical decision making (see chart for details).   Final Clinical Impressions(s) / ED Diagnoses   Final diagnoses:  Fever, unspecified fever cause  Close Exposure to Covid-19 Virus  Diverticulitis  Hiatal hernia  Biliary calculus of other site without obstruction  Pulmonary nodule   78 year old male presenting for evaluation of fevers that started today.  Had associated generalized body aches and was concerned that he may have COVID is therefore requesting testing.  His only other symptom is dull, mild suprapubic abdominal pain.  He has no GI or GU symptoms.  No upper respiratory symptoms.  On exam he is very well-appearing.  Nontoxic.  He does have a mild fever on arrival however the remainder of his vital signs are reassuring.  Lungs are clear to auscultation bilaterally.  Heart with regular rate and rhythm.  Abdomen is soft and nontender.  Normal active bowel sounds.  We will check basic labs, chest x-ray, urine and also coronavirus testing.  CBC with leukocytosis at 14.  No anemia. CMP with slightly elevated AST.  Otherwise kidney function and electrolytes are normal. Lipase normal UA without evidence of infection. COVID testing was obtained.   Given his lower abdominal pain, fever and leukocytosis with history of diverticulitis will obtain CT of the abdomen/pelvis.    CT abd/pelvis  1. Severe sigmoid diverticulosis with possible early sigmoid diverticulitis in the appropriate clinical setting. 2. There is  cholelithiasis without secondary signs of acute cholecystitis. 3. Large hiatal hernia with the majority of the stomach herniated into the thoracic cavity. This hernia also contains a small portion of the transverse colon. 4. 5 mm pulmonary nodule in the right middle lobe. No follow-up needed if patient is low-risk. Non-contrast chest CT can be considered in 12 months if patient is high-risk.   Given his findings of possible diverticulitis, we will treat patient with Augmentin.  He is nontoxic/nonseptic appearing and I do  not feel that he will require admission at this time.  I still am concerned that he may have COVID which could be contributing to his fevers.  Have advised him and his wife to self quarantine while waiting for results.  Have advised close follow-up with PCP and return to ER for new or worsening symptoms.  He voiced understanding of plan and reasons to return.  Questions answered.  Patient stable for discharge.  Case discussed with supervising physician Dr. Regenia Skeeter who is in agreement with this plan.  ------   Austin Taylor was evaluated in Emergency Department on 10/07/2018 for the symptoms described in the history of present illness. He was evaluated in the context of the global COVID-19 pandemic, which necessitated consideration that the patient might be at risk for infection with the SARS-CoV-2 virus that causes COVID-19. Institutional protocols and algorithms that pertain to the evaluation of patients at risk for COVID-19 are in a state of rapid change based on information released by regulatory bodies including the CDC and federal and state organizations. These policies and algorithms were followed during the patient's care in the ED.    ED Discharge Orders         Ordered    amoxicillin-clavulanate (AUGMENTIN) 875-125 MG tablet  2 times daily     10/07/18 2337           Bishop Dublin 10/07/18 2337    Sherwood Gambler, MD 10/08/18 [phone]

## 2018-10-07 NOTE — Discharge Instructions (Signed)
You were given a prescription for antibiotics. Please take the antibiotic prescription fully.   You should be isolated for at least 7 days since the onset of your symptoms AND >72 hours after symptoms resolution (absence of fever without the use of fever reducing medication and improvement in respiratory symptoms), whichever is longer  Please follow up with your primary doctor within the next 3-5 days. Please return to the ER sooner if you have any new or worsening symptoms, or if you have any of the following symptoms:  Abdominal pain that does not go away.  You have a fever.  You keep throwing up (vomiting).  The pain is felt only in portions of the abdomen. Pain in the right side could possibly be appendicitis. In an adult, pain in the left lower portion of the abdomen could be colitis or diverticulitis.  You pass bloody or black tarry stools.  There is bright red blood in the stool.  The constipation stays for more than 4 days.  There is belly (abdominal) or rectal pain.  You do not seem to be getting better.  You have any questions or concerns.

## 2018-10-08 LAB — NOVEL CORONAVIRUS, NAA (HOSP ORDER, SEND-OUT TO REF LAB; TAT 18-24 HRS): SARS-CoV-2, NAA: NOT DETECTED

## 2018-10-09 LAB — URINE CULTURE: Culture: NO GROWTH

## 2018-10-10 ENCOUNTER — Other Ambulatory Visit: Payer: Self-pay | Admitting: Internal Medicine

## 2018-10-10 DIAGNOSIS — I480 Paroxysmal atrial fibrillation: Secondary | ICD-10-CM

## 2018-10-10 DIAGNOSIS — I5033 Acute on chronic diastolic (congestive) heart failure: Secondary | ICD-10-CM

## 2018-10-10 DIAGNOSIS — E785 Hyperlipidemia, unspecified: Secondary | ICD-10-CM

## 2018-10-10 NOTE — Telephone Encounter (Signed)
Pt last saw Joesph July, [place] 09/22/18, last labs 10/07/18 Creat 1.05, age 78, weight 104.3kg, based on specified criteria pt is on appropriate dosage of Eliquis 5mg  BID.  Will refill rx.

## 2018-10-11 NOTE — Progress Notes (Signed)
Carelink Summary Report / Loop Recorder 

## 2018-10-30 LAB — CUP PACEART REMOTE DEVICE CHECK
Date Time Interrogation Session: 20200816144134
Implantable Pulse Generator Implant Date: 20180206

## 2018-10-31 ENCOUNTER — Ambulatory Visit (INDEPENDENT_AMBULATORY_CARE_PROVIDER_SITE_OTHER): Payer: Medicare Other | Admitting: *Deleted

## 2018-10-31 DIAGNOSIS — I480 Paroxysmal atrial fibrillation: Secondary | ICD-10-CM | POA: Diagnosis not present

## 2018-11-08 NOTE — Progress Notes (Signed)
Carelink Summary Report / Loop Recorder 

## 2018-12-02 ENCOUNTER — Ambulatory Visit (INDEPENDENT_AMBULATORY_CARE_PROVIDER_SITE_OTHER): Payer: Medicare Other | Admitting: *Deleted

## 2018-12-02 DIAGNOSIS — I480 Paroxysmal atrial fibrillation: Secondary | ICD-10-CM | POA: Diagnosis not present

## 2018-12-02 LAB — CUP PACEART REMOTE DEVICE CHECK
Date Time Interrogation Session: 20200918145817
Implantable Pulse Generator Implant Date: 20180206

## 2018-12-05 NOTE — Progress Notes (Signed)
Carelink Summary Report / Loop Recorder 

## 2018-12-09 ENCOUNTER — Other Ambulatory Visit: Payer: Self-pay | Admitting: Cardiology

## 2018-12-09 DIAGNOSIS — E785 Hyperlipidemia, unspecified: Secondary | ICD-10-CM

## 2018-12-09 DIAGNOSIS — I480 Paroxysmal atrial fibrillation: Secondary | ICD-10-CM

## 2018-12-09 DIAGNOSIS — I5033 Acute on chronic diastolic (congestive) heart failure: Secondary | ICD-10-CM

## 2019-01-04 ENCOUNTER — Ambulatory Visit (INDEPENDENT_AMBULATORY_CARE_PROVIDER_SITE_OTHER): Payer: Medicare Other | Admitting: *Deleted

## 2019-01-04 DIAGNOSIS — I4819 Other persistent atrial fibrillation: Secondary | ICD-10-CM

## 2019-01-04 DIAGNOSIS — I5032 Chronic diastolic (congestive) heart failure: Secondary | ICD-10-CM

## 2019-01-05 LAB — CUP PACEART REMOTE DEVICE CHECK
Date Time Interrogation Session: 20201021160613
Implantable Pulse Generator Implant Date: 20180206

## 2019-01-09 ENCOUNTER — Telehealth (INDEPENDENT_AMBULATORY_CARE_PROVIDER_SITE_OTHER): Payer: Medicare Other | Admitting: Internal Medicine

## 2019-01-09 VITALS — Ht 72.0 in | Wt 233.0 lb

## 2019-01-09 DIAGNOSIS — G4733 Obstructive sleep apnea (adult) (pediatric): Secondary | ICD-10-CM | POA: Diagnosis not present

## 2019-01-09 DIAGNOSIS — I480 Paroxysmal atrial fibrillation: Secondary | ICD-10-CM

## 2019-01-09 DIAGNOSIS — E663 Overweight: Secondary | ICD-10-CM | POA: Diagnosis not present

## 2019-01-09 NOTE — Progress Notes (Signed)
Electrophysiology TeleHealth Note   Due to national recommendations of social distancing due to COVID 19, an audio/video telehealth visit is felt to be most appropriate for this patient at this time.  See MyChart message from today for the patient's consent to telehealth for [facility].   Date:  01/09/2019   ID:  Austin Taylor, DOB [birth year], MRN [id number]  Location: patient's home  Provider location:  [place]  Evaluation Performed: Follow-up visit  PCP:  Maury Dus, MD   Electrophysiologist:  Dr Rayann Heman  Chief Complaint:  palpitations  History of Present Illness:    Austin Taylor is a 78 y.o. male who presents via telehealth conferencing today.  Since last being seen in our clinic, the patient reports doing very well.    His SOB is stable.  This occurs with a long walk or on incline. He has mild swelling at the end of the day chronically.  Today, he denies symptoms of palpitations, chest pain, dizziness, presyncope, or syncope.  The patient is otherwise without complaint today.  The patient denies symptoms of fevers, chills, cough, or new SOB worrisome for COVID 19.  Past Medical History:  Diagnosis Date  . Chronic kidney disease    patient denies   . History of hiatal hernia   . Hyperlipidemia   . OSA on CPAP   . Persistent atrial fibrillation   . Primary cancer of skin of ear    "might have been on my left ear"  . Stroke (cerebrum) ([place]) 12/2013   "small one"; denies residual on 08/25/2016    Past Surgical History:  Procedure Laterality Date  . ATRIAL FIBRILLATION ABLATION  08/25/2016  . ATRIAL FIBRILLATION ABLATION N/A 08/25/2016   Procedure: Atrial Fibrillation Ablation;  Surgeon: [name], MD;  Location: [facility];  Service: Cardiovascular;  Laterality: N/A;  . CARDIOVERSION N/A 09/09/2015   Procedure: CARDIOVERSION;  Surgeon: Fay Records, MD;  Location: [facility];  Service: Cardiovascular;  Laterality: N/A;  . CARDIOVERSION  N/A 11/20/2015   Procedure: CARDIOVERSION;  Surgeon: Pixie Casino, MD;  Location: [facility] ENDOSCOPY;  Service: Cardiovascular;  Laterality: N/A;  . LOOP RECORDER INSERTION N/A 04/21/2016   Procedure: Loop Recorder Insertion;  Surgeon: [name], MD;  Location: [facility];  Service: Cardiovascular;  Laterality: N/A;  . MOHS SURGERY     ear  . SHOULDER OPEN ROTATOR CUFF REPAIR Left 1998  . TEE WITHOUT CARDIOVERSION N/A 08/25/2016   Procedure: TRANSESOPHAGEAL ECHOCARDIOGRAM (TEE);  Surgeon: Jerline Pain, MD;  Location: [facility] ENDOSCOPY;  Service: Cardiovascular;  Laterality: N/A;    Current Outpatient Medications  Medication Sig Dispense Refill  . ELIQUIS 5 MG TABS tablet TAKE 1 TABLET BY MOUTH TWICE DAILY 60 tablet 5  . ferrous sulfate 325 (65 FE) MG tablet Take 325 mg by mouth every Monday, Wednesday, and Friday.     . furosemide (LASIX) 40 MG tablet TAKE 1 TABLET BY MOUTH DAILY 90 tablet 2  . Probiotic Product (PROBIOTIC PO) Take 1 tablet by mouth daily.    . sildenafil (REVATIO) 20 MG tablet Take 20 mg by mouth daily as needed (for ED). Take as directed    . simvastatin (ZOCOR) 40 MG tablet TAKE 1 TABLET BY MOUTH ONCE DAILY 30 tablet 10   No current facility-administered medications for this visit.     Allergies:   Codeine   Social History:  The patient  reports that he quit smoking about 44 years ago.  His smoking use included cigarettes. He has a 17.00 pack-year smoking history. He quit smokeless tobacco use about 43 years ago. He reports current alcohol use of about 2.0 standard drinks of alcohol per week. He reports that he does not use drugs.   Family History:  The patient's family history includes Heart disease in his brother, father, and mother; Other in his father.   ROS:  Please see the history of present illness.   All other systems are personally reviewed and negative.    Exam:    Vital Signs:  Ht 6' (1.829 m)   Wt 233 lb (105.7 kg)   BMI 31.60 kg/m   Well sounding  and appearing, alert and conversant, regular work of breathing,  good skin color Eyes- anicteric, neuro- grossly intact, skin- no apparent rash or lesions or cyanosis, mouth- oral mucosa is pink  Labs/Other Tests and Data Reviewed:    Recent Labs: 10/07/2018: ALT 36; BUN 17; Creatinine, Ser 1.05; Hemoglobin 14.7; Platelets 204; Potassium 4.4; Sodium 138   Wt Readings from Last 3 Encounters:  01/09/19 233 lb (105.7 kg)  10/07/18 230 lb (104.3 kg)  08/01/18 235 lb 6.4 oz (106.8 kg)     Last device remote is reviewed from [facility] PDF with afib observed,  Most episodes appear to be mobitz I second degree AV block rather than afib.   ASSESSMENT & PLAN:    1.  Persistent afib afib burden by ILR is 12 % by ILR however on my review, most episodes are not afib but are actually mobitz I second degree AV block.  Device was reprogrammed 09/22/2018 by Oda Kilts (his note reviewed) to "Less sensitive" with aggressive ectopy rejection. He is on eliquis  2. OSA Uses CPAP  3. 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 Overweight Lifestyle modification is encouraged   Follow-up:  9 months with me or EP APP   Patient Risk:  after full review of this patients clinical status, I feel that they are at moderate risk at this time.  Today, I have spent 15 minutes with the patient with telehealth technology discussing arrhythmia management .    Army Fossa, MD  01/09/2019 11:42 AM     [place] [address] [phone] (office) [phone] (fax)

## 2019-01-16 ENCOUNTER — Encounter: Payer: Medicare Other | Admitting: Internal Medicine

## 2019-01-17 NOTE — Progress Notes (Signed)
Carelink Summary Report / Loop Recorder 

## 2019-02-06 ENCOUNTER — Ambulatory Visit (INDEPENDENT_AMBULATORY_CARE_PROVIDER_SITE_OTHER): Payer: Medicare Other | Admitting: *Deleted

## 2019-02-06 DIAGNOSIS — I480 Paroxysmal atrial fibrillation: Secondary | ICD-10-CM

## 2019-02-07 LAB — CUP PACEART REMOTE DEVICE CHECK
Date Time Interrogation Session: 20201124070728
Implantable Pulse Generator Implant Date: 20180206

## 2019-03-05 NOTE — Addendum Note (Signed)
Addended by: Patsey Berthold on: 03/05/2019 07:26 PM   Modules accepted: Level of Service

## 2019-03-09 ENCOUNTER — Ambulatory Visit (INDEPENDENT_AMBULATORY_CARE_PROVIDER_SITE_OTHER): Payer: Medicare Other | Admitting: *Deleted

## 2019-03-09 DIAGNOSIS — I639 Cerebral infarction, unspecified: Secondary | ICD-10-CM | POA: Diagnosis not present

## 2019-03-09 LAB — CUP PACEART REMOTE DEVICE CHECK
Date Time Interrogation Session: 20201223230827
Date Time Interrogation Session: 20201224084303
Implantable Pulse Generator Implant Date: 20180206
Implantable Pulse Generator Implant Date: 20180206

## 2019-03-11 NOTE — Progress Notes (Signed)
ILR remote 

## 2019-04-03 ENCOUNTER — Other Ambulatory Visit: Payer: Self-pay | Admitting: Internal Medicine

## 2019-04-03 DIAGNOSIS — I5033 Acute on chronic diastolic (congestive) heart failure: Secondary | ICD-10-CM

## 2019-04-03 DIAGNOSIS — E785 Hyperlipidemia, unspecified: Secondary | ICD-10-CM

## 2019-04-03 DIAGNOSIS — I480 Paroxysmal atrial fibrillation: Secondary | ICD-10-CM

## 2019-04-03 NOTE — Telephone Encounter (Signed)
Eliquis 5mg  refill request received, pt is 79yrs old, weight-105.7kg, Crea-1.05 on 10/07/2018, Diagnosis-Afib, and last seen by Dr. Rayann Heman on 04/07/2018 via Telemedicine. Dose is appropriate based on dosing criteria. Will send in refill to requested pharmacy.

## 2019-04-04 ENCOUNTER — Ambulatory Visit: Payer: Medicare Other | Attending: Internal Medicine

## 2019-04-04 DIAGNOSIS — Z23 Encounter for immunization: Secondary | ICD-10-CM | POA: Insufficient documentation

## 2019-04-04 NOTE — Progress Notes (Signed)
   Covid-19 Vaccination Clinic  Name:  Austin Taylor    MRN: [id number] DOB: 12-23-1940  04/04/2019  [name] was observed post Covid-19 immunization for 15 minutes without incidence. He was provided with Vaccine Information Sheet and instruction to access the V-Safe system.   [name] was instructed to call 911 with any severe reactions post vaccine: Marland Kitchen Difficulty breathing  . Swelling of your face and throat  . A fast heartbeat  . A bad rash all over your body  . Dizziness and weakness    Immunizations Administered    Name Date Dose VIS Date Route   Pfizer COVID-19 Vaccine 04/04/2019 10:04 AM 0.3 mL 02/24/2019 Intramuscular   Manufacturer: [place]   Lot: [id number]   [place]: [id number]

## 2019-04-10 ENCOUNTER — Ambulatory Visit (INDEPENDENT_AMBULATORY_CARE_PROVIDER_SITE_OTHER): Payer: Medicare Other | Admitting: *Deleted

## 2019-04-10 DIAGNOSIS — I639 Cerebral infarction, unspecified: Secondary | ICD-10-CM | POA: Diagnosis not present

## 2019-04-10 LAB — CUP PACEART REMOTE DEVICE CHECK
Date Time Interrogation Session: 20210124231013
Implantable Pulse Generator Implant Date: 20180206

## 2019-04-25 ENCOUNTER — Ambulatory Visit: Payer: Medicare Other | Attending: Internal Medicine

## 2019-04-25 DIAGNOSIS — Z23 Encounter for immunization: Secondary | ICD-10-CM | POA: Insufficient documentation

## 2019-04-25 NOTE — Progress Notes (Signed)
   [facility]  Name:  Austin Taylor    MRN: [id number] DOB: 09/19/1940  04/25/2019  Mr. Austin Taylor was observed post Covid-19 immunization for 15 minutes without incidence. He was provided with Vaccine Information Sheet and instruction to access the V-Safe system.   Mr. Austin Taylor was instructed to call 911 with any severe reactions post vaccine: Marland Kitchen Difficulty breathing  . Swelling of your face and throat  . A fast heartbeat  . A bad rash all over your body  . Dizziness and weakness    Immunizations Administered    Name Date Dose VIS Date Route   Pfizer COVID-19 Vaccine 04/25/2019  9:22 AM 0.3 mL 02/24/2019 Intramuscular   Manufacturer: [employer]   Lot: [id number]   [place]: [id number]

## 2019-05-10 LAB — CUP PACEART REMOTE DEVICE CHECK
Date Time Interrogation Session: 20210224234937
Implantable Pulse Generator Implant Date: 20180206

## 2019-05-11 ENCOUNTER — Ambulatory Visit (INDEPENDENT_AMBULATORY_CARE_PROVIDER_SITE_OTHER): Payer: Medicare Other | Admitting: *Deleted

## 2019-05-11 DIAGNOSIS — I639 Cerebral infarction, unspecified: Secondary | ICD-10-CM | POA: Diagnosis not present

## 2019-05-11 LAB — CUP PACEART REMOTE DEVICE CHECK
Date Time Interrogation Session: 20210225084114
Implantable Pulse Generator Implant Date: 20180206

## 2019-05-11 NOTE — Progress Notes (Signed)
ILR Remote 

## 2019-06-12 ENCOUNTER — Ambulatory Visit (INDEPENDENT_AMBULATORY_CARE_PROVIDER_SITE_OTHER): Payer: Medicare Other | Admitting: *Deleted

## 2019-06-12 DIAGNOSIS — I639 Cerebral infarction, unspecified: Secondary | ICD-10-CM

## 2019-06-12 LAB — CUP PACEART REMOTE DEVICE CHECK
Date Time Interrogation Session: 20210328021350
Implantable Pulse Generator Implant Date: 20180206

## 2019-06-12 NOTE — Progress Notes (Signed)
ILR Remote 

## 2019-07-13 LAB — CUP PACEART REMOTE DEVICE CHECK
Date Time Interrogation Session: 20210428230243
Implantable Pulse Generator Implant Date: 20180206

## 2019-07-17 ENCOUNTER — Ambulatory Visit (INDEPENDENT_AMBULATORY_CARE_PROVIDER_SITE_OTHER): Payer: Medicare Other | Admitting: *Deleted

## 2019-07-17 DIAGNOSIS — I4819 Other persistent atrial fibrillation: Secondary | ICD-10-CM

## 2019-07-17 NOTE — Progress Notes (Signed)
Carelink Summary Report / Loop Recorder 

## 2019-08-14 LAB — CUP PACEART REMOTE DEVICE CHECK
Date Time Interrogation Session: 20210529232231
Implantable Pulse Generator Implant Date: 20180206

## 2019-08-15 ENCOUNTER — Ambulatory Visit (INDEPENDENT_AMBULATORY_CARE_PROVIDER_SITE_OTHER): Payer: Medicare Other | Admitting: *Deleted

## 2019-08-15 DIAGNOSIS — I480 Paroxysmal atrial fibrillation: Secondary | ICD-10-CM | POA: Diagnosis not present

## 2019-08-16 NOTE — Progress Notes (Signed)
Carelink Summary Report / Loop Recorder 

## 2019-08-25 ENCOUNTER — Other Ambulatory Visit: Payer: Self-pay | Admitting: Internal Medicine

## 2019-08-25 DIAGNOSIS — E785 Hyperlipidemia, unspecified: Secondary | ICD-10-CM

## 2019-08-25 DIAGNOSIS — I5033 Acute on chronic diastolic (congestive) heart failure: Secondary | ICD-10-CM

## 2019-08-25 DIAGNOSIS — I480 Paroxysmal atrial fibrillation: Secondary | ICD-10-CM

## 2019-08-25 NOTE — Telephone Encounter (Signed)
Eliquis 5mg  refill request received. Patient is 79 years old, weight-105.7kg, Crea-0.99 on 06/14/2019 via [facility] from [place] Diagnosis-Afib, and last seen by Dr. Rayann Heman via video visit on 01/09/2019. Dose is appropriate based on dosing criteria. Will send in refill to requested pharmacy.

## 2019-09-04 ENCOUNTER — Other Ambulatory Visit: Payer: Self-pay | Admitting: Cardiology

## 2019-09-04 DIAGNOSIS — I5033 Acute on chronic diastolic (congestive) heart failure: Secondary | ICD-10-CM

## 2019-09-04 DIAGNOSIS — I480 Paroxysmal atrial fibrillation: Secondary | ICD-10-CM

## 2019-09-04 DIAGNOSIS — E785 Hyperlipidemia, unspecified: Secondary | ICD-10-CM

## 2019-09-15 ENCOUNTER — Ambulatory Visit (INDEPENDENT_AMBULATORY_CARE_PROVIDER_SITE_OTHER): Payer: Medicare Other | Admitting: *Deleted

## 2019-09-15 DIAGNOSIS — I480 Paroxysmal atrial fibrillation: Secondary | ICD-10-CM

## 2019-09-15 LAB — CUP PACEART REMOTE DEVICE CHECK
Date Time Interrogation Session: 20210701230949
Implantable Pulse Generator Implant Date: 20180206

## 2019-09-19 NOTE — Progress Notes (Signed)
Carelink Summary Report / Loop Recorder 

## 2019-09-25 ENCOUNTER — Telehealth: Payer: Self-pay

## 2019-09-25 NOTE — Telephone Encounter (Signed)
[facility] alert received- Manual transmission.    Spoke with pt, he sent transmission because the lights came on on the monitor so he thought that was what it wanted.  Advised pt he does not need to do anything except sleep next to the monitor.

## 2019-10-02 ENCOUNTER — Other Ambulatory Visit: Payer: Self-pay | Admitting: Cardiology

## 2019-10-02 ENCOUNTER — Telehealth: Payer: Self-pay | Admitting: Cardiology

## 2019-10-02 DIAGNOSIS — I5033 Acute on chronic diastolic (congestive) heart failure: Secondary | ICD-10-CM

## 2019-10-02 DIAGNOSIS — I480 Paroxysmal atrial fibrillation: Secondary | ICD-10-CM

## 2019-10-02 DIAGNOSIS — E785 Hyperlipidemia, unspecified: Secondary | ICD-10-CM

## 2019-10-02 MED ORDER — FUROSEMIDE 40 MG PO TABS: 40.0000 mg | ORAL_TABLET | Freq: Every day | ORAL | 0 refills | Status: DC

## 2019-10-02 NOTE — Telephone Encounter (Signed)
Pt's medication was sent to pt's pharmacy as requested. Confirmation received.  °

## 2019-10-02 NOTE — Telephone Encounter (Signed)
° °*  STAT* If patient is at the pharmacy, call can be transferred to refill team.   1. Which medications need to be refilled? (please list name of each medication and dose if known)   furosemide (LASIX) 40 MG tablet  2. Which pharmacy/location (including street and city if local pharmacy) is medication to be sent to?   [facility] - [place] [address].  3. Do they need a 30 day or 90 day supply? 30  Patient has appt on 10/17/19 with Ermalinda Barrios but needs enough furosemide to make it to the appointment

## 2019-10-10 NOTE — Progress Notes (Signed)
Cardiology Office Note    Date:  10/17/2019   ID:  CHUN SELLEN, DOB 09/20/1940, MRN [id number]  PCP:  Maury Dus, MD  Cardiologist: Ena Dawley, MD EPS: [name], MD  No chief complaint on file.   History of Present Illness:  Austin Taylor is a 79 y.o. male with history of persistent atrial fibrillation on Eliquis, Mobitz 1 second-degree AV block avoid AV nodal agents, HLD, obesity, OSA on CPAP, chronic diastolic CHF.   Patient is scheduled for loop explant 10/24/19. Denies chest pain, dyspnea, shortness of breath. Has some leg swelling and needs lasix refill. Doesn't limit his salt. No regular exercise.    Past Medical History:  Diagnosis Date  . Chronic kidney disease    patient denies   . History of hiatal hernia   . Hyperlipidemia   . OSA on CPAP   . Persistent atrial fibrillation ([place])   . Primary cancer of skin of ear    "might have been on my left ear"  . 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 ATRIAL FIBRILLATION ABLATION  08/25/2016  . ATRIAL FIBRILLATION ABLATION N/A 08/25/2016   Procedure: Atrial Fibrillation Ablation;  Surgeon: [name], MD;  Location: [place] [facility];  Service: Cardiovascular;  Laterality: N/A;  . CARDIOVERSION N/A 09/09/2015   Procedure: CARDIOVERSION;  Surgeon: Fay Records, MD;  Location: [place];  Service: Cardiovascular;  Laterality: N/A;  . CARDIOVERSION N/A 11/20/2015   Procedure: CARDIOVERSION;  Surgeon: Pixie Casino, MD;  Location: [facility] ENDOSCOPY;  Service: Cardiovascular;  Laterality: N/A;  . LOOP RECORDER INSERTION N/A 04/21/2016   Procedure: Loop Recorder Insertion;  Surgeon: [name], MD;  Location: [place] [facility];  Service: Cardiovascular;  Laterality: N/A;  . MOHS SURGERY     ear  . SHOULDER OPEN ROTATOR CUFF REPAIR Left 1998  . TEE WITHOUT CARDIOVERSION N/A 08/25/2016   Procedure: TRANSESOPHAGEAL  ECHOCARDIOGRAM (TEE);  Surgeon: Jerline Pain, MD;  Location: [facility] ENDOSCOPY;  Service: Cardiovascular;  Laterality: N/A;    Current Medications: Current Meds  Medication Sig  . ELIQUIS 5 MG TABS tablet TAKE 1 TABLET BY MOUTH TWICE DAILY  . ferrous sulfate 325 (65 FE) MG tablet Take 325 mg by mouth every Monday, Wednesday, and Friday.   . furosemide (LASIX) 40 MG tablet TAKE 1 TABLET BY MOUTH DAILY  . Probiotic Product (PROBIOTIC PO) Take 1 tablet by mouth daily.  . sildenafil (REVATIO) 20 MG tablet Take 20 mg by mouth daily as needed (for ED). Take as directed  . simvastatin (ZOCOR) 40 MG tablet TAKE 1 TABLET BY MOUTH ONCE DAILY  . [DISCONTINUED] furosemide (LASIX) 40 MG tablet TAKE 1 TABLET BY MOUTH DAILY **PLEASE MAKE AN APPOINTMENT WITH DR. Meda Coffee FOR ANYMORE REFILLS*     Allergies:   Codeine   Social History   Socioeconomic History  . Marital status: Married    Spouse name: Not on file  . Number of children: 2  . Years of education: 76  . Highest education level: Not on file  Occupational History  . Occupation: Chemical engineer: OTHER    Comment: Retired  Tobacco Use  . Smoking status: Former Smoker    Packs/day: 1.00    Years: 17.00    Pack years: 17.00    Types: Cigarettes    Quit date: 08/02/1974    Years since quitting: 45.2  .  Smokeless tobacco: Former Systems developer    Quit date: 1977  Vaping Use  . Vaping Use: Never used  Substance and Sexual Activity  . Alcohol use: Yes    Alcohol/week: 2.0 standard drinks    Types: 2 Cans of beer per week  . Drug use: No  . Sexual activity: Yes  Other Topics Concern  . Not on file  Social History Narrative   Patient is married with 2 children.   Patient is right handed.   Patient has 14 yrs of education.   Patient does not drink caffeine.   Social Determinants of Health   Financial Resource Strain:   . Difficulty of Paying Living Expenses:   Food Insecurity:   . Worried About Charity fundraiser in the Last Year:     . Arboriculturist in the Last Year:   Transportation Needs:   . Film/video editor (Medical):   Marland Kitchen Lack of Transportation (Non-Medical):   Physical Activity:   . Days of Exercise per Week:   . Minutes of Exercise per Session:   Stress:   . Feeling of Stress :   Social Connections:   . Frequency of Communication with Friends and Family:   . Frequency of Social Gatherings with Friends and Family:   . Attends Religious Services:   . Active Member of Clubs or Organizations:   . Attends Archivist Meetings:   Marland Kitchen Marital Status:      Family History:  The patient's family history includes Heart disease in his brother, father, and mother; Other in his father.   ROS:   Please see the history of present illness.    ROS All other systems reviewed and are negative.   PHYSICAL EXAM:   VS:  BP 112/64   Pulse (!) 55   Ht 6' (1.829 m)   Wt 237 lb 9.6 oz (107.8 kg)   SpO2 91%   BMI 32.22 kg/m   Physical Exam  GEN: Well nourished, well developed, in no acute distress  Neck: no JVD, carotid bruits, or masses Cardiac:distant HS, RRR; no murmurs, rubs, or gallops  Respiratory:  clear to auscultation bilaterally, normal work of breathing GI: soft, nontender, nondistended, + BS Ext: without cyanosis, clubbing, or edema, Good distal pulses bilaterally Neuro:  Alert and Oriented x 3 Psych: euthymic mood, full affect  Wt Readings from Last 3 Encounters:  10/17/19 237 lb 9.6 oz (107.8 kg)  01/09/19 233 lb (105.7 kg)  10/07/18 230 lb (104.3 kg)      Studies/Labs Reviewed:   EKG:  EKG is  ordered today.  The ekg ordered today demonstrates Mobitz 1 second-degree AV block unchanged.  Recent Labs: No results found for requested labs within last 8760 hours.   Lipid Panel    Component Value Date/Time   CHOL 140 05/28/2015 0754   TRIG 95 05/28/2015 0754   HDL 44 05/28/2015 0754   CHOLHDL 3.2 05/28/2015 0754   VLDL 19 05/28/2015 0754   LDLCALC 77 05/28/2015 0754     Additional studies/ records that were reviewed today include:  Echo 2017 Study Conclusions   - Left ventricle: The cavity size was normal. Systolic function was    vigorous. The estimated ejection fraction was in the range of 65%    to 70%. Wall motion was normal; there were no regional wall    motion abnormalities. Features are consistent with a pseudonormal    left ventricular filling pattern, with concomitant abnormal  relaxation and increased filling pressure (grade 2 diastolic    dysfunction). Doppler parameters are consistent with elevated    ventricular end-diastolic filling pressure.  - Aortic valve: Trileaflet; normal thickness leaflets. There was no    regurgitation.  - Aortic root: The aortic root was normal in size.  - Ascending aorta: The ascending aorta was normal in size.  - Mitral valve: Structurally normal valve. There was no    regurgitation.  - Left atrium: The atrium was mildly dilated.  - Right ventricle: The cavity size was normal. Wall thickness was    normal. Systolic function was normal.  - Right atrium: The atrium was normal in size.  - Tricuspid valve: There was mild regurgitation.  - Pulmonary arteries: Systolic pressure was within the normal    range. PA peak pressure: 33 mm Hg (S).  - Inferior vena cava: The vessel was normal in size.  - Pericardium, extracardiac: A trivial pericardial effusion was    identified posterior to the heart.   Impressions:   - There is no significant change form the study on 12/29/2013.    Study Conclusions   - Left ventricle: The cavity size was normal. Systolic function was   vigorous. The estimated ejection fraction was in the range of 65%   to 70%. Wall motion was normal; there were no regional wall   motion abnormalities. Features are consistent with a pseudonormal   left ventricular filling pattern, with concomitant abnormal   relaxation and increased filling pressure (grade 2 diastolic   dysfunction).  Doppler parameters are consistent with elevated   ventricular end-diastolic filling pressure. - Aortic valve: Trileaflet; normal thickness leaflets. There was no   regurgitation. - Aortic root: The aortic root was normal in size. - Ascending aorta: The ascending aorta was normal in size. - Mitral valve: Structurally normal valve. There was no   regurgitation. - Left atrium: The atrium was mildly dilated. - Right ventricle: The cavity size was normal. Wall thickness was   normal. Systolic function was normal. - Right atrium: The atrium was normal in size. - Tricuspid valve: There was mild regurgitation. - Pulmonary arteries: Systolic pressure was within the normal   range. PA peak pressure: 33 mm Hg (S). - Inferior vena cava: The vessel was normal in size. - Pericardium, extracardiac: A trivial pericardial effusion was   identified posterior to the heart.   Impressions:   - There is no significant change form the study on 12/29/2013.   TEE 2018 Impressions:   - Large hiatal hernia obstructed cardiac views. Unable to assess.    Aorta appears normal.      Discussed with Dr. Rayann Heman (pre-ablation)      ASSESSMENT:    1. Longstanding persistent atrial fibrillation ([place])   2. Mobitz type 1 second degree atrioventricular block   3. Obesity (BMI 30-39.9)   4. OSA (obstructive sleep apnea)   5. HLD (hyperlipidemia)   6. Chronic diastolic CHF (congestive heart failure) (H[facility])      PLAN:  In order of problems listed above:  Persistent atrial fibrillation on Eliquis, has Mobitz 1 second-degree AV block unchanged. No rate lowering medications. Is having loop recorder removed next week.  Mobitz 1 second-degree AV block avoid AV nodal agents in the future  Obesity 150 minutes of exercise and weight loss recommended.  OSA on CPAP  HLD on Zocor. Check fasting lipid panel LDL 88 11/4852  Chronic diastolic CHF with mild leg edema patient treated with Lasix. Refill and check  surveillance labs    Medication Adjustments/Labs and Tests Ordered: Current medicines are reviewed at length with the patient today.  Concerns regarding medicines are outlined above.  Medication changes, Labs and Tests ordered today are listed in the Patient Instructions below. Patient Instructions   Medication Instructions:  Your physician recommends that you continue on your current medications as directed. Please refer to the Current Medication list given to you today.  *If you need a refill on your cardiac medications before your next appointment, please call your pharmacy*   Lab Work: TODAY: CBC, CMET, FLP  If you have labs (blood work) drawn today and your tests are completely normal, you will receive your results only by: Marland Kitchen MyChart Message (if you have MyChart) OR . A paper copy in the mail If you have any lab test that is abnormal or we need to change your treatment, we will call you to review the results.   Testing/Procedures: None   Follow-Up: At [facility], you and your health needs are our priority.  As part of our continuing mission to provide you with exceptional heart care, we have created designated Provider Care Teams.  These Care Teams include your primary Cardiologist (physician) and Advanced Practice Providers (APPs -  Physician Assistants and Nurse Practitioners) who all work together to provide you with the care you need, when you need it.  We recommend signing up for the patient portal called "MyChart".  Sign up information is provided on this After Visit Summary.  MyChart is used to connect with patients for Virtual Visits (Telemedicine).  Patients are able to view lab/test results, encounter notes, upcoming appointments, etc.  Non-urgent messages can be sent to your provider as well.   To learn more about what you can do with MyChart, go to [email or web address].    Your next appointment:   12 month(s)  The format for your next appointment:   In  Person  Provider:   You may see Ena Dawley, MD or one of the following Advanced Practice Providers on your designated Care Team:    Melina Copa, PA-C  Ermalinda Barrios, PA-C    Other Instructions Your provider recommends that you maintain 150 minutes per week of moderate aerobic activity.  Two Gram Sodium Diet 2000 mg  What is Sodium? Sodium is a mineral found naturally in many foods. The most significant source of sodium in the diet is table salt, which is about 40% sodium.  Processed, convenience, and preserved foods also contain a large amount of sodium.  The body needs only 500 mg of sodium daily to function,  A normal diet provides more than enough sodium even if you do not use salt.  Why Limit Sodium? A build up of sodium in the body can cause thirst, increased blood pressure, shortness of breath, and water retention.  Decreasing sodium in the diet can reduce edema and risk of heart attack or stroke associated with high blood pressure.  Keep in mind that there are many other factors involved in these health problems.  Heredity, obesity, lack of exercise, cigarette smoking, stress and what you eat all play a role.  General Guidelines:  Do not add salt at the table or in cooking.  One teaspoon of salt contains over 2 grams of sodium.  Read food labels  Avoid processed and convenience foods  Ask your dietitian before eating any foods not dicussed in the menu planning guidelines  Consult your physician if you wish to use a salt substitute or  a sodium containing medication such as antacids.  Limit milk and milk products to 16 oz (2 cups) per day.  Shopping Hints:  READ LABELS!! "Dietetic" does not necessarily mean low sodium.  Salt and other sodium ingredients are often added to foods during processing.   Menu Planning Guidelines Food Group Choose More Often Avoid  Beverages (see also the milk group All fruit juices, low-sodium, salt-free vegetables juices, low-sodium  carbonated beverages Regular vegetable or tomato juices, commercially softened water used for drinking or cooking  Breads and Cereals Enriched white, wheat, rye and pumpernickel bread, hard rolls and dinner rolls; muffins, cornbread and waffles; most dry cereals, cooked cereal without added salt; unsalted crackers and breadsticks; low sodium or homemade bread crumbs Bread, rolls and crackers with salted tops; quick breads; instant hot cereals; pancakes; commercial bread stuffing; self-rising flower and biscuit mixes; regular bread crumbs or cracker crumbs  Desserts and Sweets Desserts and sweets mad with mild should be within allowance Instant pudding mixes and cake mixes  Fats Butter or margarine; vegetable oils; unsalted salad dressings, regular salad dressings limited to 1 Tbs; light, sour and heavy cream Regular salad dressings containing bacon fat, bacon bits, and salt pork; snack dips made with instant soup mixes or processed cheese; salted nuts  Fruits Most fresh, frozen and canned fruits Fruits processed with salt or sodium-containing ingredient (some dried fruits are processed with sodium sulfites        Vegetables Fresh, frozen vegetables and low- sodium canned vegetables Regular canned vegetables, sauerkraut, pickled vegetables, and others prepared in brine; frozen vegetables in sauces; vegetables seasoned with ham, bacon or salt pork  Condiments, Sauces, Miscellaneous  Salt substitute with physician's approval; pepper, herbs, spices; vinegar, lemon or lime juice; hot pepper sauce; garlic powder, onion powder, low sodium soy sauce (1 Tbs.); low sodium condiments (ketchup, chili sauce, mustard) in limited amounts (1 tsp.) fresh ground horseradish; unsalted tortilla chips, pretzels, potato chips, popcorn, salsa (1/4 cup) Any seasoning made with salt including garlic salt, celery salt, onion salt, and seasoned salt; sea salt, rock salt, kosher salt; meat tenderizers; monosodium glutamate;  mustard, regular soy sauce, barbecue, sauce, chili sauce, teriyaki sauce, steak sauce, Worcestershire sauce, and most flavored vinegars; canned gravy and mixes; regular condiments; salted snack foods, olives, picles, relish, horseradish sauce, catsup   Food preparation: Try these seasonings Meats:    Pork Sage, onion Serve with applesauce  Chicken Poultry seasoning, thyme, parsley Serve with cranberry sauce  Lamb Curry powder, rosemary, garlic, thyme Serve with mint sauce or jelly  Veal Marjoram, basil Serve with current jelly, cranberry sauce  Beef Pepper, bay leaf Serve with dry mustard, unsalted chive butter  Fish [place] leaf, dill Serve with unsalted lemon butter, unsalted parsley butter  Vegetables:    Asparagus Lemon juice   Broccoli Lemon juice   Carrots Mustard dressing parsley, mint, nutmeg, glazed with unsalted butter and sugar   Green beans Marjoram, lemon juice, nutmeg,dill seed   Tomatoes Basil, marjoram, onion   Spice /blend for [employer]" 4 tsp ground thyme 1 tsp ground sage 3 tsp ground rosemary 4 tsp ground marjoram   Test your knowledge 1. A product that says "Salt Free" may still contain sodium. True or False 2. Garlic Powder and Hot Pepper Sauce an be used as alternative seasonings.True or False 3. Processed foods have more sodium than fresh foods.  True or False 4. Canned Vegetables have less sodium than froze True or False  WAYS TO DECREASE YOUR SODIUM INTAKE  1. Avoid the use of added salt in cooking and at the table.  Table salt (and other prepared seasonings which contain salt) is probably one of the greatest sources of sodium in the diet.  Unsalted foods can gain flavor from the sweet, sour, and butter taste sensations of herbs and spices.  Instead of using salt for seasoning, try the following seasonings with the foods listed.  Remember: how you use them to enhance natural food flavors is limited only by your creativity... Allspice-Meat, fish, eggs, fruit, peas,  red and yellow vegetables Almond Extract-Fruit baked goods Anise Seed-Sweet breads, fruit, carrots, beets, cottage cheese, cookies (tastes like licorice) Basil-Meat, fish, eggs, vegetables, rice, vegetables salads, soups, sauces Bay Leaf-Meat, fish, stews, poultry Burnet-Salad, vegetables (cucumber-like flavor) Caraway Seed-Bread, cookies, cottage cheese, meat, vegetables, cheese, rice Cardamon-Baked goods, fruit, soups Celery Powder or seed-Salads, salad dressings, sauces, meatloaf, soup, bread.Do not use  celery salt Chervil-Meats, salads, fish, eggs, vegetables, cottage cheese (parsley-like flavor) Chili Power-Meatloaf, chicken cheese, corn, eggplant, egg dishes Chives-Salads cottage cheese, egg dishes, soups, vegetables, sauces Cilantro-Salsa, casseroles Cinnamon-Baked goods, fruit, pork, lamb, chicken, carrots Cloves-Fruit, baked goods, fish, pot roast, green beans, beets, carrots Coriander-Pastry, cookies, meat, salads, cheese (lemon-orange flavor) Cumin-Meatloaf, fish,cheese, eggs, cabbage,fruit pie (caraway flavor) [employer], fruit, eggs, fish, poultry, cottage cheese, vegetables Dill Seed-Meat, cottage cheese, poultry, vegetables, fish, salads, bread Fennel Seed-Bread, cookies, apples, pork, eggs, fish, beets, cabbage, cheese, Licorice-like flavor Garlic-(buds or powder) Salads, meat, poultry, fish, bread, butter, vegetables, potatoes.Do not  use garlic salt Ginger-Fruit, vegetables, baked goods, meat, fish, poultry Horseradish Root-Meet, vegetables, butter Lemon Juice or Extract-Vegetables, fruit, tea, baked goods, fish salads Mace-Baked goods fruit, vegetables, fish, poultry (taste like nutmeg) Maple Extract-Syrups Marjoram-Meat, chicken, fish, vegetables, breads, green salads (taste like Sage) Mint-Tea, lamb, sherbet, vegetables, desserts, carrots, cabbage Mustard, Dry or Seed-Cheese, eggs, meats, vegetables, poultry Nutmeg-Baked goods, fruit, chicken, eggs,  vegetables, desserts Onion Powder-Meat, fish, poultry, vegetables, cheese, eggs, bread, rice salads (Do not use   Onion salt) Orange Extract-Desserts, baked goods Oregano-Pasta, eggs, cheese, onions, pork, lamb, fish, chicken, vegetables, green salads Paprika-Meat, fish, poultry, eggs, cheese, vegetables Parsley Flakes-Butter, vegetables, meat fish, poultry, eggs, bread, salads (certain forms may   Contain sodium Pepper-Meat fish, poultry, vegetables, eggs Peppermint Extract-Desserts, baked goods Poppy Seed-Eggs, bread, cheese, fruit dressings, baked goods, noodles, vegetables, cottage  [employer], poultry, meat, fish, cauliflower, turnips,eggs bread Saffron-Rice, bread, veal, chicken, fish, eggs Sage-Meat, fish, poultry, onions, eggplant, tomateos, pork, stews Savory-Eggs, salads, poultry, meat, rice, vegetables, soups, pork Tarragon-Meat, poultry, fish, eggs, butter, vegetables (licorice-like flavor)  Thyme-Meat, poultry, fish, eggs, vegetables, (clover-like flavor), sauces, soups Tumeric-Salads, butter, eggs, fish, rice, vegetables (saffron-like flavor) Vanilla Extract-Baked goods, candy Vinegar-Salads, vegetables, meat marinades Walnut Extract-baked goods, candy  2. Choose your Foods Wisely   The following is a list of foods to avoid which are high in sodium:  Meats-Avoid all smoked, canned, salt cured, dried and kosher meat and fish as well as Anchovies   [employer] meats:Bologna, Liverwurst, Pastrami Canned meat or fish  Marinated herring Caviar    Pepperoni Corned Beef   Pizza Dried chipped beef  Salami Frozen breaded fish or meat Salt pork Frankfurters or hot dogs  Sardines Gefilte fish   Sausage Ham (boiled ham, Proscuitto Smoked butt    spiced ham)   Spam      TV Dinners Vegetables Canned vegetables (Regular) Relish Canned mushrooms  Sauerkraut Olives    Tomato juice [employer]  and Dessert  Products Canned puddings  Cream pies Cheesecake   Decorated cakes Cookies  Beverages/Juices Tomato juice, regular  Gatorade   V-8 vegetable juice, regular  Breads and Cereals Biscuit mixes   Salted potato chips, corn chips, pretzels Bread stuffing mixes  Salted crackers and rolls Pancake and waffle mixes Self-rising flour  Seasonings Accent    Meat sauces Barbecue sauce  Meat tenderizer Catsup    Monosodium glutamate (MSG) Celery salt   Onion salt Chili sauce   Prepared mustard Garlic salt   Salt, seasoned salt, sea salt Gravy mixes   Soy sauce Horseradish   Steak sauce Ketchup   Tartar sauce Lite salt    Teriyaki sauce Marinade mixes   Worcestershire sauce  Others Baking powder   Cocoa and cocoa mixes Baking soda   Commercial casserole mixes Candy-caramels, chocolate  Dehydrated soups    Bars, fudge,nougats  Instant rice and pasta mixes Canned broth or soup  Maraschino cherries Cheese, aged and processed cheese and cheese spreads  Learning Assessment Quiz  Indicated T (for True) or F (for False) for each of the following statements:  1. _____ Fresh fruits and vegetables and unprocessed grains are generally low in sodium 2. _____ Water may contain a considerable amount of sodium, depending on the source 3. _____ You can always tell if a food is high in sodium by tasting it 4. _____ Certain laxatives my be high in sodium and should be avoided unless prescribed   by a physician or pharmacist 5. _____ Salt substitutes may be used freely by anyone on a sodium restricted diet 6. _____ Sodium is present in table salt, food additives and as a natural component of   most foods 7. _____ Table salt is approximately 90% sodium 8. _____ Limiting sodium intake may help prevent excess fluid accumulation in the body 9. _____ On a sodium-restricted diet, seasonings such as bouillon soy sauce, and    cooking wine should be used in place of table salt 10. _____ On an ingredient list, a  product which lists monosodium glutamate as the first   ingredient is an appropriate food to include on a low sodium diet  Circle the best answer(s) to the following statements (Hint: there may be more than one correct answer)  11. On a low-sodium diet, some acceptable snack items are:    A. Olives  F. Bean dip   K. Grapefruit juice    B. Salted Pretzels G. Commercial Popcorn   L. Canned peaches    C. Carrot Sticks  H. Bouillon   M. Unsalted nuts   D. [place] fries  I. Peanut butter crackers N. Salami   E. Sweet pickles J. Tomato Juice   O. Pizza  12.  Seasonings that may be used freely on a reduced - sodium diet include   A. Lemon wedges F.Monosodium glutamate K. Celery seed    B.Soysauce   G. Pepper   L. Mustard powder   C. Sea salt  H. Cooking wine  M. Onion flakes   D. Vinegar  E. Prepared horseradish N. Salsa   E. Sage   J. Worcestershire sauce  O. [address]      Sumner Boast, PA-C  10/17/2019 8:31 AM    [facility] [place], [place], [place]  [id number] Phone: [phone]; Fax: [phone]

## 2019-10-16 ENCOUNTER — Telehealth: Payer: Self-pay | Admitting: *Deleted

## 2019-10-16 NOTE — Telephone Encounter (Signed)
LINQ alert received for RRT as of 10/12/19. Pt made aware, agreeable to schedule appointment to discuss plan with Dr. Allred (possible explant). Pt aware that he will receive call from scheduler. ° °Unenrolled from [facility], return kit ordered to home address. ° ° ° ° °

## 2019-10-17 ENCOUNTER — Other Ambulatory Visit: Payer: Self-pay

## 2019-10-17 ENCOUNTER — Encounter: Payer: Self-pay | Admitting: Physician Assistant

## 2019-10-17 ENCOUNTER — Ambulatory Visit: Payer: Medicare Other | Admitting: Physician Assistant

## 2019-10-17 VITALS — BP 112/64 | HR 55 | Ht 72.0 in | Wt 237.6 lb

## 2019-10-17 DIAGNOSIS — I441 Atrioventricular block, second degree: Secondary | ICD-10-CM

## 2019-10-17 DIAGNOSIS — G4733 Obstructive sleep apnea (adult) (pediatric): Secondary | ICD-10-CM

## 2019-10-17 DIAGNOSIS — I4811 Longstanding persistent atrial fibrillation: Secondary | ICD-10-CM

## 2019-10-17 DIAGNOSIS — E669 Obesity, unspecified: Secondary | ICD-10-CM | POA: Diagnosis not present

## 2019-10-17 DIAGNOSIS — E7849 Other hyperlipidemia: Secondary | ICD-10-CM

## 2019-10-17 DIAGNOSIS — I5032 Chronic diastolic (congestive) heart failure: Secondary | ICD-10-CM

## 2019-10-17 LAB — COMPREHENSIVE METABOLIC PANEL
ALT: 14 IU/L (ref 0–44)
AST: 16 IU/L (ref 0–40)
Albumin/Globulin Ratio: 1.7 (ref 1.2–2.2)
Albumin: 4.1 g/dL (ref 3.7–4.7)
Alkaline Phosphatase: 79 IU/L (ref 48–121)
BUN/Creatinine Ratio: 15 (ref 10–24)
BUN: 15 mg/dL (ref 8–27)
Bilirubin Total: 0.5 mg/dL (ref 0.0–1.2)
CO2: 24 mmol/L (ref 20–29)
Calcium: 8.8 mg/dL (ref 8.6–10.2)
Chloride: 100 mmol/L (ref 96–106)
Creatinine, Ser: 0.99 mg/dL (ref 0.76–1.27)
GFR calc Af Amer: 83 mL/min/{1.73_m2} (ref >59)
GFR calc non Af Amer: 72 mL/min/{1.73_m2} (ref >59)
Globulin, Total: 2.4 g/dL (ref 1.5–4.5)
Glucose: 109 mg/dL — ABNORMAL HIGH (ref 65–99)
Potassium: 3.9 mmol/L (ref 3.5–5.2)
Sodium: 136 mmol/L (ref 134–144)
Total Protein: 6.5 g/dL (ref 6.0–8.5)

## 2019-10-17 LAB — CBC
Hematocrit: 40.5 % (ref 37.5–51.0)
Hemoglobin: 14.1 g/dL (ref 13.0–17.7)
MCH: 31.1 pg (ref 26.6–33.0)
MCHC: 34.8 g/dL (ref 31.5–35.7)
MCV: 89 fL (ref 79–97)
Platelets: 244 10*3/uL (ref 150–450)
RBC: 4.53 x10E6/uL (ref 4.14–5.80)
RDW: 13 % (ref 11.6–15.4)
WBC: 7.5 10*3/uL (ref 3.4–10.8)

## 2019-10-17 LAB — LIPID PANEL
Chol/HDL Ratio: 4.1 ratio (ref 0.0–5.0)
Cholesterol, Total: 150 mg/dL (ref 100–199)
HDL: 37 mg/dL — ABNORMAL LOW (ref >39)
LDL Chol Calc (NIH): 91 mg/dL (ref 0–99)
Triglycerides: 122 mg/dL (ref 0–149)
VLDL Cholesterol Cal: 22 mg/dL (ref 5–40)

## 2019-10-17 MED ORDER — FUROSEMIDE 40 MG PO TABS: ORAL_TABLET | ORAL | 3 refills | Status: DC

## 2019-10-17 NOTE — Patient Instructions (Signed)
Medication Instructions:  Your physician recommends that you continue on your current medications as directed. Please refer to the Current Medication list given to you today.  *If you need a refill on your cardiac medications before your next appointment, please call your pharmacy*   Lab Work: TODAY: CBC, CMET, FLP  If you have labs (blood work) drawn today and your tests are completely normal, you will receive your results only by: Marland Kitchen MyChart Message (if you have MyChart) OR . A paper copy in the mail If you have any lab test that is abnormal or we need to change your treatment, we will call you to review the results.   Testing/Procedures: None   Follow-Up: At [facility], you and your health needs are our priority.  As part of our continuing mission to provide you with exceptional heart care, we have created designated Provider Care Teams.  These Care Teams include your primary Cardiologist (physician) and Advanced Practice Providers (APPs -  Physician Assistants and Nurse Practitioners) who all work together to provide you with the care you need, when you need it.  We recommend signing up for the patient portal called "MyChart".  Sign up information is provided on this After Visit Summary.  MyChart is used to connect with patients for Virtual Visits (Telemedicine).  Patients are able to view lab/test results, encounter notes, upcoming appointments, etc.  Non-urgent messages can be sent to your provider as well.   To learn more about what you can do with MyChart, go to [email or web address].    Your next appointment:   12 month(s)  The format for your next appointment:   In Person  Provider:   You may see Ena Dawley, MD or one of the following Advanced Practice Providers on your designated Care Team:    Melina Copa, PA-C  Ermalinda Barrios, PA-C    Other Instructions Your provider recommends that you maintain 150 minutes per week of moderate aerobic activity.  Two  Gram Sodium Diet 2000 mg  What is Sodium? Sodium is a mineral found naturally in many foods. The most significant source of sodium in the diet is table salt, which is about 40% sodium.  Processed, convenience, and preserved foods also contain a large amount of sodium.  The body needs only 500 mg of sodium daily to function,  A normal diet provides more than enough sodium even if you do not use salt.  Why Limit Sodium? A build up of sodium in the body can cause thirst, increased blood pressure, shortness of breath, and water retention.  Decreasing sodium in the diet can reduce edema and risk of heart attack or stroke associated with high blood pressure.  Keep in mind that there are many other factors involved in these health problems.  Heredity, obesity, lack of exercise, cigarette smoking, stress and what you eat all play a role.  General Guidelines:  Do not add salt at the table or in cooking.  One teaspoon of salt contains over 2 grams of sodium.  Read food labels  Avoid processed and convenience foods  Ask your dietitian before eating any foods not dicussed in the menu planning guidelines  Consult your physician if you wish to use a salt substitute or a sodium containing medication such as antacids.  Limit milk and milk products to 16 oz (2 cups) per day.  Shopping Hints:  READ LABELS!! "Dietetic" does not necessarily mean low sodium.  Salt and other sodium ingredients are often added to foods during processing.  Menu Planning Guidelines Food Group Choose More Often Avoid  Beverages (see also the milk group All fruit juices, low-sodium, salt-free vegetables juices, low-sodium carbonated beverages Regular vegetable or tomato juices, commercially softened water used for drinking or cooking  Breads and Cereals Enriched white, wheat, rye and pumpernickel bread, hard rolls and dinner rolls; muffins, cornbread and waffles; most dry cereals, cooked cereal without added salt; unsalted  crackers and breadsticks; low sodium or homemade bread crumbs Bread, rolls and crackers with salted tops; quick breads; instant hot cereals; pancakes; commercial bread stuffing; self-rising flower and biscuit mixes; regular bread crumbs or cracker crumbs  Desserts and Sweets Desserts and sweets mad with mild should be within allowance Instant pudding mixes and cake mixes  Fats Butter or margarine; vegetable oils; unsalted salad dressings, regular salad dressings limited to 1 Tbs; light, sour and heavy cream Regular salad dressings containing bacon fat, bacon bits, and salt pork; snack dips made with instant soup mixes or processed cheese; salted nuts  Fruits Most fresh, frozen and canned fruits Fruits processed with salt or sodium-containing ingredient (some dried fruits are processed with sodium sulfites        Vegetables Fresh, frozen vegetables and low- sodium canned vegetables Regular canned vegetables, sauerkraut, pickled vegetables, and others prepared in brine; frozen vegetables in sauces; vegetables seasoned with ham, bacon or salt pork  Condiments, Sauces, Miscellaneous  Salt substitute with physician's approval; pepper, herbs, spices; vinegar, lemon or lime juice; hot pepper sauce; garlic powder, onion powder, low sodium soy sauce (1 Tbs.); low sodium condiments (ketchup, chili sauce, mustard) in limited amounts (1 tsp.) fresh ground horseradish; unsalted tortilla chips, pretzels, potato chips, popcorn, salsa (1/4 cup) Any seasoning made with salt including garlic salt, celery salt, onion salt, and seasoned salt; sea salt, rock salt, kosher salt; meat tenderizers; monosodium glutamate; mustard, regular soy sauce, barbecue, sauce, chili sauce, teriyaki sauce, steak sauce, Worcestershire sauce, and most flavored vinegars; canned gravy and mixes; regular condiments; salted snack foods, olives, picles, relish, horseradish sauce, catsup   Food preparation: Try these seasonings Meats:    Pork  Sage, onion Serve with applesauce  Chicken Poultry seasoning, thyme, parsley Serve with cranberry sauce  Lamb Curry powder, rosemary, garlic, thyme Serve with mint sauce or jelly  Veal Marjoram, basil Serve with current jelly, cranberry sauce  Beef Pepper, bay leaf Serve with dry mustard, unsalted chive butter  Fish [place] leaf, dill Serve with unsalted lemon butter, unsalted parsley butter  Vegetables:    Asparagus Lemon juice   Broccoli Lemon juice   Carrots Mustard dressing parsley, mint, nutmeg, glazed with unsalted butter and sugar   Green beans Marjoram, lemon juice, nutmeg,dill seed   Tomatoes Basil, marjoram, onion   Spice /blend for [employer]" 4 tsp ground thyme 1 tsp ground sage 3 tsp ground rosemary 4 tsp ground marjoram   Test your knowledge 1. A product that says "Salt Free" may still contain sodium. True or False 2. Garlic Powder and Hot Pepper Sauce an be used as alternative seasonings.True or False 3. Processed foods have more sodium than fresh foods.  True or False 4. Canned Vegetables have less sodium than froze True or False  WAYS TO DECREASE YOUR SODIUM INTAKE 1. Avoid the use of added salt in cooking and at the table.  Table salt (and other prepared seasonings which contain salt) is probably one of the greatest sources of sodium in the diet.  Unsalted foods can gain flavor from the sweet, sour, and butter taste  sensations of herbs and spices.  Instead of using salt for seasoning, try the following seasonings with the foods listed.  Remember: how you use them to enhance natural food flavors is limited only by your creativity... Allspice-Meat, fish, eggs, fruit, peas, red and yellow vegetables Almond Extract-Fruit baked goods Anise Seed-Sweet breads, fruit, carrots, beets, cottage cheese, cookies (tastes like licorice) Basil-Meat, fish, eggs, vegetables, rice, vegetables salads, soups, sauces Bay Leaf-Meat, fish, stews, poultry Burnet-Salad, vegetables (cucumber-like  flavor) Caraway Seed-Bread, cookies, cottage cheese, meat, vegetables, cheese, rice Cardamon-Baked goods, fruit, soups Celery Powder or seed-Salads, salad dressings, sauces, meatloaf, soup, bread.Do not use  celery salt Chervil-Meats, salads, fish, eggs, vegetables, cottage cheese (parsley-like flavor) Chili Power-Meatloaf, chicken cheese, corn, eggplant, egg dishes Chives-Salads cottage cheese, egg dishes, soups, vegetables, sauces Cilantro-Salsa, casseroles Cinnamon-Baked goods, fruit, pork, lamb, chicken, carrots Cloves-Fruit, baked goods, fish, pot roast, green beans, beets, carrots Coriander-Pastry, cookies, meat, salads, cheese (lemon-orange flavor) Cumin-Meatloaf, fish,cheese, eggs, cabbage,fruit pie (caraway flavor) [employer], fruit, eggs, fish, poultry, cottage cheese, vegetables Dill Seed-Meat, cottage cheese, poultry, vegetables, fish, salads, bread Fennel Seed-Bread, cookies, apples, pork, eggs, fish, beets, cabbage, cheese, Licorice-like flavor Garlic-(buds or powder) Salads, meat, poultry, fish, bread, butter, vegetables, potatoes.Do not  use garlic salt Ginger-Fruit, vegetables, baked goods, meat, fish, poultry Horseradish Root-Meet, vegetables, butter Lemon Juice or Extract-Vegetables, fruit, tea, baked goods, fish salads Mace-Baked goods fruit, vegetables, fish, poultry (taste like nutmeg) Maple Extract-Syrups Marjoram-Meat, chicken, fish, vegetables, breads, green salads (taste like Sage) Mint-Tea, lamb, sherbet, vegetables, desserts, carrots, cabbage Mustard, Dry or Seed-Cheese, eggs, meats, vegetables, poultry Nutmeg-Baked goods, fruit, chicken, eggs, vegetables, desserts Onion Powder-Meat, fish, poultry, vegetables, cheese, eggs, bread, rice salads (Do not use   Onion salt) Orange Extract-Desserts, baked goods Oregano-Pasta, eggs, cheese, onions, pork, lamb, fish, chicken, vegetables, green salads Paprika-Meat, fish, poultry, eggs, cheese, vegetables Parsley  Flakes-Butter, vegetables, meat fish, poultry, eggs, bread, salads (certain forms may   Contain sodium Pepper-Meat fish, poultry, vegetables, eggs Peppermint Extract-Desserts, baked goods Poppy Seed-Eggs, bread, cheese, fruit dressings, baked goods, noodles, vegetables, cottage  [employer], poultry, meat, fish, cauliflower, turnips,eggs bread Saffron-Rice, bread, veal, chicken, fish, eggs Sage-Meat, fish, poultry, onions, eggplant, tomateos, pork, stews Savory-Eggs, salads, poultry, meat, rice, vegetables, soups, pork Tarragon-Meat, poultry, fish, eggs, butter, vegetables (licorice-like flavor)  Thyme-Meat, poultry, fish, eggs, vegetables, (clover-like flavor), sauces, soups Tumeric-Salads, butter, eggs, fish, rice, vegetables (saffron-like flavor) Vanilla Extract-Baked goods, candy Vinegar-Salads, vegetables, meat marinades Walnut Extract-baked goods, candy  2. Choose your Foods Wisely   The following is a list of foods to avoid which are high in sodium:  Meats-Avoid all smoked, canned, salt cured, dried and kosher meat and fish as well as Anchovies   [employer] meats:Bologna, Liverwurst, Pastrami Canned meat or fish  Marinated herring Caviar    Pepperoni Corned Beef   Pizza Dried chipped beef  Salami Frozen breaded fish or meat Salt pork Frankfurters or hot dogs  Sardines Gefilte fish   Sausage Ham (boiled ham, Proscuitto Smoked butt    spiced ham)   Spam      TV Dinners Vegetables Canned vegetables (Regular) Relish Canned mushrooms  Sauerkraut Olives    Tomato juice Pickles  Bakery and Dessert Products Canned puddings  Cream pies Cheesecake   Decorated cakes Cookies  Beverages/Juices Tomato juice, regular  Gatorade   V-8 vegetable juice, regular  Breads and Cereals Biscuit mixes   Salted potato chips, corn chips, pretzels Bread stuffing mixes  Salted crackers and  rolls Pancake and waffle mixes Self-rising  flour  Seasonings Accent    Meat sauces Barbecue sauce  Meat tenderizer Catsup    Monosodium glutamate (MSG) Celery salt   Onion salt Chili sauce   Prepared mustard Garlic salt   Salt, seasoned salt, sea salt Gravy mixes   Soy sauce Horseradish   Steak sauce Ketchup   Tartar sauce Lite salt    Teriyaki sauce Marinade mixes   Worcestershire sauce  Others Baking powder   Cocoa and cocoa mixes Baking soda   Commercial casserole mixes Candy-caramels, chocolate  Dehydrated soups    Bars, fudge,nougats  Instant rice and pasta mixes Canned broth or soup  Maraschino cherries Cheese, aged and processed cheese and cheese spreads  Learning Assessment Quiz  Indicated T (for True) or F (for False) for each of the following statements:  1. _____ Fresh fruits and vegetables and unprocessed grains are generally low in sodium 2. _____ Water may contain a considerable amount of sodium, depending on the source 3. _____ You can always tell if a food is high in sodium by tasting it 4. _____ Certain laxatives my be high in sodium and should be avoided unless prescribed   by a physician or pharmacist 5. _____ Salt substitutes may be used freely by anyone on a sodium restricted diet 6. _____ Sodium is present in table salt, food additives and as a natural component of   most foods 7. _____ Table salt is approximately 90% sodium 8. _____ Limiting sodium intake may help prevent excess fluid accumulation in the body 9. _____ On a sodium-restricted diet, seasonings such as bouillon soy sauce, and    cooking wine should be used in place of table salt 10. _____ On an ingredient list, a product which lists monosodium glutamate as the first   ingredient is an appropriate food to include on a low sodium diet  Circle the best answer(s) to the following statements (Hint: there may be more than one correct answer)  11. On a low-sodium diet, some acceptable snack items are:    A. Olives  F. Bean dip   K.  Grapefruit juice    B. Salted Pretzels G. Commercial Popcorn   L. Canned peaches    C. Carrot Sticks  H. Bouillon   M. Unsalted nuts   D. [place] fries  I. Peanut butter crackers N. Salami   E. Sweet pickles J. Tomato Juice   O. Pizza  12.  Seasonings that may be used freely on a reduced - sodium diet include   A. Lemon wedges F.Monosodium glutamate K. Celery seed    B.Soysauce   G. Pepper   L. Mustard powder   C. Sea salt  H. Cooking wine  M. Onion flakes   D. Vinegar  E. Prepared horseradish N. Salsa   E. Sage   J. Worcestershire sauce  O. Chutney

## 2019-10-24 ENCOUNTER — Ambulatory Visit: Payer: Medicare Other | Admitting: Internal Medicine

## 2019-10-24 ENCOUNTER — Other Ambulatory Visit: Payer: Self-pay

## 2019-10-24 ENCOUNTER — Encounter: Payer: Self-pay | Admitting: Internal Medicine

## 2019-10-24 VITALS — BP 124/72 | HR 62 | Ht 72.0 in | Wt 235.7 lb

## 2019-10-24 DIAGNOSIS — I4819 Other persistent atrial fibrillation: Secondary | ICD-10-CM

## 2019-10-24 DIAGNOSIS — G4733 Obstructive sleep apnea (adult) (pediatric): Secondary | ICD-10-CM

## 2019-10-24 DIAGNOSIS — Z9889 Other specified postprocedural states: Secondary | ICD-10-CM | POA: Insufficient documentation

## 2019-10-24 DIAGNOSIS — E669 Obesity, unspecified: Secondary | ICD-10-CM | POA: Diagnosis not present

## 2019-10-24 DIAGNOSIS — I441 Atrioventricular block, second degree: Secondary | ICD-10-CM

## 2019-10-24 HISTORY — PX: OTHER SURGICAL HISTORY: SHX169

## 2019-10-24 NOTE — Patient Instructions (Addendum)
Medication Instructions:  Your physician recommends that you continue on your current medications as directed. Please refer to the Current Medication list given to you today.  Labwork: None ordered.  Testing/Procedures: None ordered.  Follow up:  No wound check is necessary per Dr Rayann Heman   Your physician wants you to follow-up in: 6 months with Dr. Rayann Heman.  You will receive a reminder letter in the mail two months in advance. If you don't receive a letter, please call our office to schedule the follow-up appointment.    Implantable Loop Recorder Placement, Care After This sheet gives you information about how to care for yourself after your procedure. Your health care provider may also give you more specific instructions. If you have problems or questions, contact your health care provider. What can I expect after the procedure? After the procedure, it is common to have:  Soreness or discomfort near the incision.  Some swelling or bruising near the incision.  Follow these instructions at home: Incision care  1.  Leave your outer dressing on for 24 hours.  After 24 hours you can remove your outer dressing and shower. 2. Leave adhesive strips in place. These skin closures may need to stay in place for 1-2 weeks. If adhesive strip edges start to loosen and curl up, you may trim the loose edges.  You may remove the strips if they have not fallen off after 2 weeks. 3. Check your incision area every day for signs of infection. Check for: a. Redness, swelling, or pain. b. Fluid or blood. c. Warmth. d. Pus or a bad smell. 4. Do not take baths, swim, or use a hot tub until your incision is completely healed. 5. If your wound site starts to bleed apply pressure.      If you have any questions/concerns please call the device clinic at [phone].  Activity  Return to your normal activities.  General instructions  Follow instructions from your health care provider about how to  manage your implantable loop recorder and transmit the information. Learn how to activate a recording if this is necessary for your type of device.  Do not go through a metal detection gate, and do not let someone hold a metal detector over your chest. Show your ID card.  Do not have an MRI unless you check with your health care provider first.  Take over-the-counter and prescription medicines only as told by your health care provider.  Keep all follow-up visits as told by your health care provider. This is important. Contact a health care provider if:  You have redness, swelling, or pain around your incision.  You have a fever.  You have pain that is not relieved by your pain medicine.  You have triggered your device because of fainting (syncope) or because of a heartbeat that feels like it is racing, slow, fluttering, or skipping (palpitations). Get help right away if you have:  Chest pain.  Difficulty breathing. Summary  After the procedure, it is common to have soreness or discomfort near the incision.  Change your dressing as told by your health care provider.  Follow instructions from your health care provider about how to manage your implantable loop recorder and transmit the information.  Keep all follow-up visits as told by your health care provider. This is important. This information is not intended to replace advice given to you by your health care provider. Make sure you discuss any questions you have with your health care provider. Document Released: 02/11/2015  Document Revised: 04/17/2017 Document Reviewed: 04/17/2017 Elsevier Patient Education  2020 [employer].

## 2019-10-24 NOTE — Progress Notes (Signed)
PCP: Maury Dus, MD   Primary EP: Dr Rayann Heman  Austin Taylor is a 79 y.o. male who presents today for routine electrophysiology followup.  Since last being seen in our clinic, the patient reports doing very well.  Today, he denies symptoms of palpitations, chest pain, shortness of breath,  lower extremity edema, dizziness, presyncope, or syncope.  The patient is otherwise without complaint today.   Past Medical History:  Diagnosis Date  . Chronic kidney disease    patient denies   . History of hiatal hernia   . Hyperlipidemia   . OSA on CPAP   . Persistent atrial fibrillation ([place])   . Primary cancer of skin of ear    "might have been on my left ear"  . 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 ATRIAL FIBRILLATION ABLATION  08/25/2016  . ATRIAL FIBRILLATION ABLATION N/A 08/25/2016   Procedure: Atrial Fibrillation Ablation;  Surgeon: [name], MD;  Location: [place] [facility];  Service: Cardiovascular;  Laterality: N/A;  . CARDIOVERSION N/A 09/09/2015   Procedure: CARDIOVERSION;  Surgeon: Fay Records, MD;  Location: [place];  Service: Cardiovascular;  Laterality: N/A;  . CARDIOVERSION N/A 11/20/2015   Procedure: CARDIOVERSION;  Surgeon: Pixie Casino, MD;  Location: [facility] ENDOSCOPY;  Service: Cardiovascular;  Laterality: N/A;  . LOOP RECORDER INSERTION N/A 04/21/2016   Procedure: Loop Recorder Insertion;  Surgeon: [name], MD;  Location: [place] [facility];  Service: Cardiovascular;  Laterality: N/A;  . MOHS SURGERY     ear  . SHOULDER OPEN ROTATOR CUFF REPAIR Left 1998  . TEE WITHOUT CARDIOVERSION N/A 08/25/2016   Procedure: TRANSESOPHAGEAL ECHOCARDIOGRAM (TEE);  Surgeon: Jerline Pain, MD;  Location: [facility] ENDOSCOPY;  Service: Cardiovascular;  Laterality: N/A;    ROS- all systems are reviewed and negatives except as per HPI above  Current Outpatient Medications  Medication  Sig Dispense Refill  . ELIQUIS 5 MG TABS tablet TAKE 1 TABLET BY MOUTH TWICE DAILY 60 tablet 5  . ferrous sulfate 325 (65 FE) MG tablet Take 325 mg by mouth every Monday, Wednesday, and Friday.     . furosemide (LASIX) 40 MG tablet TAKE 1 TABLET BY MOUTH DAILY 90 tablet 3  . Probiotic Product (PROBIOTIC PO) Take 1 tablet by mouth daily.    . sildenafil (REVATIO) 20 MG tablet Take 20 mg by mouth daily as needed (for ED). Take as directed    . simvastatin (ZOCOR) 40 MG tablet TAKE 1 TABLET BY MOUTH ONCE DAILY 30 tablet 10   No current facility-administered medications for this visit.    Physical Exam: Vitals:   10/24/19 1022  BP: 124/72  Pulse: 62  SpO2: 98%  Weight: 235 lb 11.2 oz (106.9 kg)  Height: 6' (1.829 m)    GEN- The patient is well appearing, alert and oriented x 3 today.   Head- normocephalic, atraumatic Eyes-  Sclera clear, conjunctiva pink Ears- hearing intact Oropharynx- clear Lungs-   normal work of breathing Heart- Regular rate and rhythm  GI- soft  Extremities- no clubbing, cyanosis, or edema  Wt Readings from Last 3 Encounters:  10/24/19 235 lb 11.2 oz (106.9 kg)  10/17/19 237 lb 9.6 oz (107.8 kg)  01/09/19 233 lb (105.7 kg)    Assessment and Plan:  1. persistent atrial fibrillation Symptoms are well controlled Loop is at EOS Risks and benefits to ILR removal including bleeding  and infection were discussed with the patient who wishes to proceed.  He wishes to have a new ILR placed as he has found this to be very beneficial for afib management and to further characterize his palpitations Continue anticoagulation with eliquis  2. Mobitz I second degree AV block Stable No change required today  3. OSA Uses CPAP  4. HL On zocor  5. Chronic diastolic dysfunction Stable No change required today   Risks, benefits and potential toxicities for medications prescribed and/or refilled reviewed with patient today.   [name] MD,  [facility] 10/24/2019 10:54 AM     DESCRIPTION OF PROCEDURE:  Informed written consent was obtained.  The patient required no sedation for the procedure today.  The patients left chest was prepped and draped.   The skin overlying his ILR was infiltrated with lidocaine for local analgesia.  A 0.5-cm incision was made at the implant site. The previously implanted medtronic Reveal [id number] was removed using a combination of sharp and blunt dissection.  A new Medtronic Reveal Linq model [id number] (SN # [id number] ) implantable loop recorder was then placed into the pocket R waves were very prominent and measured > 0.2 mV. EBL<1 ml.  Steri- Strips and a sterile dressing were then applied.  There were no early apparent complications.     CONCLUSIONS:   1. Successful explantation of a previously implanted loop recorder and reimplantation of a new Medtronic Reveal [id number] implantable loop recorder for afib management and further evaluation of palpitations  2. No early apparent complications.    Return in 6 months  [name] MD, [facility] 10/24/2019 11:04 AM

## 2019-11-02 ENCOUNTER — Ambulatory Visit: Payer: Medicare Other

## 2019-11-27 ENCOUNTER — Ambulatory Visit (INDEPENDENT_AMBULATORY_CARE_PROVIDER_SITE_OTHER): Payer: Medicare Other | Admitting: *Deleted

## 2019-11-27 DIAGNOSIS — I480 Paroxysmal atrial fibrillation: Secondary | ICD-10-CM | POA: Diagnosis not present

## 2019-11-27 LAB — CUP PACEART REMOTE DEVICE CHECK
Date Time Interrogation Session: 20210912102505
Implantable Pulse Generator Implant Date: 20210810

## 2019-11-29 NOTE — Progress Notes (Signed)
Carelink Summary Report / Loop Recorder 

## 2019-12-27 ENCOUNTER — Other Ambulatory Visit: Payer: Self-pay | Admitting: Internal Medicine

## 2019-12-27 DIAGNOSIS — E785 Hyperlipidemia, unspecified: Secondary | ICD-10-CM

## 2019-12-27 DIAGNOSIS — I5033 Acute on chronic diastolic (congestive) heart failure: Secondary | ICD-10-CM

## 2019-12-27 DIAGNOSIS — I480 Paroxysmal atrial fibrillation: Secondary | ICD-10-CM

## 2019-12-27 NOTE — Telephone Encounter (Signed)
Eliquis 5mg  refill request received. Patient is 79 years old, weight-106.9kg, Crea-0.99 on 10/17/2019, Diagnosis-Afib, and last seen by Dr. Rayann Heman on 10/24/2019. Dose is appropriate based on dosing criteria. Will send in refill to requested pharmacy.

## 2019-12-30 LAB — CUP PACEART REMOTE DEVICE CHECK
Date Time Interrogation Session: 20211015103047
Implantable Pulse Generator Implant Date: 20210810

## 2020-01-31 LAB — CUP PACEART REMOTE DEVICE CHECK
Date Time Interrogation Session: 20211117093309
Implantable Pulse Generator Implant Date: 20210810

## 2020-02-01 ENCOUNTER — Ambulatory Visit (INDEPENDENT_AMBULATORY_CARE_PROVIDER_SITE_OTHER): Payer: Medicare Other

## 2020-02-01 DIAGNOSIS — I480 Paroxysmal atrial fibrillation: Secondary | ICD-10-CM

## 2020-02-02 NOTE — Progress Notes (Signed)
Carelink Summary Report / Loop Recorder 

## 2020-03-05 ENCOUNTER — Ambulatory Visit (INDEPENDENT_AMBULATORY_CARE_PROVIDER_SITE_OTHER): Payer: Medicare Other

## 2020-03-05 DIAGNOSIS — I480 Paroxysmal atrial fibrillation: Secondary | ICD-10-CM

## 2020-03-05 LAB — CUP PACEART REMOTE DEVICE CHECK
Date Time Interrogation Session: 20211220093359
Implantable Pulse Generator Implant Date: 20210810

## 2020-03-19 NOTE — Progress Notes (Signed)
Carelink Summary Report / Loop Recorder 

## 2020-04-07 LAB — CUP PACEART REMOTE DEVICE CHECK
Date Time Interrogation Session: 20220122093451
Implantable Pulse Generator Implant Date: 20210810

## 2020-04-08 ENCOUNTER — Ambulatory Visit (INDEPENDENT_AMBULATORY_CARE_PROVIDER_SITE_OTHER): Payer: Medicare Other

## 2020-04-08 DIAGNOSIS — I480 Paroxysmal atrial fibrillation: Secondary | ICD-10-CM | POA: Diagnosis not present

## 2020-04-19 NOTE — Progress Notes (Signed)
Carelink Summary Report / Loop Recorder 

## 2020-05-09 LAB — CUP PACEART REMOTE DEVICE CHECK
Date Time Interrogation Session: 20220224093702
Implantable Pulse Generator Implant Date: 20210810

## 2020-05-10 DIAGNOSIS — K219 Gastro-esophageal reflux disease without esophagitis: Secondary | ICD-10-CM | POA: Diagnosis not present

## 2020-05-10 DIAGNOSIS — E7800 Pure hypercholesterolemia, unspecified: Secondary | ICD-10-CM | POA: Diagnosis not present

## 2020-05-10 DIAGNOSIS — I110 Hypertensive heart disease with heart failure: Secondary | ICD-10-CM | POA: Diagnosis not present

## 2020-05-10 DIAGNOSIS — I509 Heart failure, unspecified: Secondary | ICD-10-CM | POA: Diagnosis not present

## 2020-05-10 DIAGNOSIS — D509 Iron deficiency anemia, unspecified: Secondary | ICD-10-CM | POA: Diagnosis not present

## 2020-05-10 DIAGNOSIS — I63541 Cerebral infarction due to unspecified occlusion or stenosis of right cerebellar artery: Secondary | ICD-10-CM | POA: Diagnosis not present

## 2020-05-10 DIAGNOSIS — I480 Paroxysmal atrial fibrillation: Secondary | ICD-10-CM | POA: Diagnosis not present

## 2020-05-13 ENCOUNTER — Ambulatory Visit (INDEPENDENT_AMBULATORY_CARE_PROVIDER_SITE_OTHER): Payer: Medicare Other

## 2020-05-13 DIAGNOSIS — I480 Paroxysmal atrial fibrillation: Secondary | ICD-10-CM | POA: Diagnosis not present

## 2020-05-20 NOTE — Progress Notes (Signed)
Carelink Summary Report / Loop Recorder 

## 2020-06-11 ENCOUNTER — Ambulatory Visit (INDEPENDENT_AMBULATORY_CARE_PROVIDER_SITE_OTHER): Payer: Medicare Other

## 2020-06-11 DIAGNOSIS — I480 Paroxysmal atrial fibrillation: Secondary | ICD-10-CM

## 2020-06-11 LAB — CUP PACEART REMOTE DEVICE CHECK
Date Time Interrogation Session: 20220329103921
Implantable Pulse Generator Implant Date: 20210810

## 2020-06-13 DIAGNOSIS — I110 Hypertensive heart disease with heart failure: Secondary | ICD-10-CM | POA: Diagnosis not present

## 2020-06-13 DIAGNOSIS — K219 Gastro-esophageal reflux disease without esophagitis: Secondary | ICD-10-CM | POA: Diagnosis not present

## 2020-06-13 DIAGNOSIS — I480 Paroxysmal atrial fibrillation: Secondary | ICD-10-CM | POA: Diagnosis not present

## 2020-06-13 DIAGNOSIS — I509 Heart failure, unspecified: Secondary | ICD-10-CM | POA: Diagnosis not present

## 2020-06-13 DIAGNOSIS — E7800 Pure hypercholesterolemia, unspecified: Secondary | ICD-10-CM | POA: Diagnosis not present

## 2020-06-13 DIAGNOSIS — D509 Iron deficiency anemia, unspecified: Secondary | ICD-10-CM | POA: Diagnosis not present

## 2020-06-13 DIAGNOSIS — I63541 Cerebral infarction due to unspecified occlusion or stenosis of right cerebellar artery: Secondary | ICD-10-CM | POA: Diagnosis not present

## 2020-06-17 DIAGNOSIS — L814 Other melanin hyperpigmentation: Secondary | ICD-10-CM | POA: Diagnosis not present

## 2020-06-17 DIAGNOSIS — D1801 Hemangioma of skin and subcutaneous tissue: Secondary | ICD-10-CM | POA: Diagnosis not present

## 2020-06-17 DIAGNOSIS — L570 Actinic keratosis: Secondary | ICD-10-CM | POA: Diagnosis not present

## 2020-06-17 DIAGNOSIS — Z85828 Personal history of other malignant neoplasm of skin: Secondary | ICD-10-CM | POA: Diagnosis not present

## 2020-06-17 DIAGNOSIS — L821 Other seborrheic keratosis: Secondary | ICD-10-CM | POA: Diagnosis not present

## 2020-06-25 NOTE — Progress Notes (Signed)
Carelink Summary Report / Loop Recorder 

## 2020-07-11 ENCOUNTER — Telehealth: Payer: Self-pay | Admitting: Internal Medicine

## 2020-07-11 DIAGNOSIS — I5032 Chronic diastolic (congestive) heart failure: Secondary | ICD-10-CM

## 2020-07-11 DIAGNOSIS — E7849 Other hyperlipidemia: Secondary | ICD-10-CM

## 2020-07-11 MED ORDER — FUROSEMIDE 40 MG PO TABS: ORAL_TABLET | ORAL | 1 refills | Status: DC

## 2020-07-11 NOTE — Telephone Encounter (Signed)
*  STAT* If patient is at the pharmacy, call can be transferred to refill team. ? ? ?1. Which medications need to be refilled? (please list name of each medication and dose if known) furosemide (LASIX) 40 MG tablet ? ?2. Which pharmacy/location (including street and city if local pharmacy) is medication to be sent to? [facility] - [place], [place] - [address] ? ?3. Do they need a 30 day or 90 day supply? 90 ?

## 2020-07-11 NOTE — Telephone Encounter (Signed)
Pt's medication was sent to pt's pharmacy as requested. Confirmation received.  °

## 2020-07-12 DIAGNOSIS — I63541 Cerebral infarction due to unspecified occlusion or stenosis of right cerebellar artery: Secondary | ICD-10-CM | POA: Diagnosis not present

## 2020-07-12 DIAGNOSIS — I110 Hypertensive heart disease with heart failure: Secondary | ICD-10-CM | POA: Diagnosis not present

## 2020-07-12 DIAGNOSIS — K219 Gastro-esophageal reflux disease without esophagitis: Secondary | ICD-10-CM | POA: Diagnosis not present

## 2020-07-12 DIAGNOSIS — E7800 Pure hypercholesterolemia, unspecified: Secondary | ICD-10-CM | POA: Diagnosis not present

## 2020-07-12 DIAGNOSIS — I509 Heart failure, unspecified: Secondary | ICD-10-CM | POA: Diagnosis not present

## 2020-07-12 DIAGNOSIS — D509 Iron deficiency anemia, unspecified: Secondary | ICD-10-CM | POA: Diagnosis not present

## 2020-07-12 DIAGNOSIS — I480 Paroxysmal atrial fibrillation: Secondary | ICD-10-CM | POA: Diagnosis not present

## 2020-07-15 ENCOUNTER — Ambulatory Visit (INDEPENDENT_AMBULATORY_CARE_PROVIDER_SITE_OTHER): Payer: Medicare Other

## 2020-07-15 DIAGNOSIS — I441 Atrioventricular block, second degree: Secondary | ICD-10-CM

## 2020-07-15 LAB — CUP PACEART REMOTE DEVICE CHECK
Date Time Interrogation Session: 20220501104400
Implantable Pulse Generator Implant Date: 20210810

## 2020-07-17 DIAGNOSIS — D509 Iron deficiency anemia, unspecified: Secondary | ICD-10-CM | POA: Diagnosis not present

## 2020-07-17 DIAGNOSIS — Z1389 Encounter for screening for other disorder: Secondary | ICD-10-CM | POA: Diagnosis not present

## 2020-07-17 DIAGNOSIS — E7800 Pure hypercholesterolemia, unspecified: Secondary | ICD-10-CM | POA: Diagnosis not present

## 2020-07-17 DIAGNOSIS — I110 Hypertensive heart disease with heart failure: Secondary | ICD-10-CM | POA: Diagnosis not present

## 2020-07-17 DIAGNOSIS — I480 Paroxysmal atrial fibrillation: Secondary | ICD-10-CM | POA: Diagnosis not present

## 2020-07-17 DIAGNOSIS — Z0000 Encounter for general adult medical examination without abnormal findings: Secondary | ICD-10-CM | POA: Diagnosis not present

## 2020-07-17 DIAGNOSIS — G4733 Obstructive sleep apnea (adult) (pediatric): Secondary | ICD-10-CM | POA: Diagnosis not present

## 2020-07-17 DIAGNOSIS — I509 Heart failure, unspecified: Secondary | ICD-10-CM | POA: Diagnosis not present

## 2020-07-17 DIAGNOSIS — D6869 Other thrombophilia: Secondary | ICD-10-CM | POA: Diagnosis not present

## 2020-08-02 NOTE — Progress Notes (Signed)
Carelink Summary Report / Loop Recorder 

## 2020-08-05 NOTE — Progress Notes (Signed)
Electrophysiology Office Note Date: 08/06/2020  ID:  Austin Taylor, DOB [birth year], MRN [id number]  PCP: Maury Dus, MD Primary Cardiologist: Ena Dawley, MD (Inactive) Electrophysiologist: [name], MD   CC: ILR follow-up  Austin Taylor is a 80 y.o. male seen today for Dr. Rayann Heman . he presents today for routine electrophysiology followup.  Since last being seen in our clinic, the patient reports doing very well.  he denies chest pain, palpitations, dyspnea, PND, orthopnea, nausea, vomiting, dizziness, syncope, edema, weight gain, or early satiety.  Device History: Medtronic loop recorder implanted 04/2016, re-implant 10/24/2019 for management of atrial fibrillation  Past Medical History:  Diagnosis Date  . Chronic kidney disease    patient denies   . History of hiatal hernia   . Hyperlipidemia   . OSA on CPAP   . Persistent atrial fibrillation ([place])   . Primary cancer of skin of ear    "might have been on my left ear"  . 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 ATRIAL FIBRILLATION ABLATION  08/25/2016  . ATRIAL FIBRILLATION ABLATION N/A 08/25/2016   Procedure: Atrial Fibrillation Ablation;  Surgeon: [name], MD;  Location: [place] [facility];  Service: Cardiovascular;  Laterality: N/A;  . CARDIOVERSION N/A 09/09/2015   Procedure: CARDIOVERSION;  Surgeon: Fay Records, MD;  Location: [place];  Service: Cardiovascular;  Laterality: N/A;  . CARDIOVERSION N/A 11/20/2015   Procedure: CARDIOVERSION;  Surgeon: Pixie Casino, MD;  Location: [facility] ENDOSCOPY;  Service: Cardiovascular;  Laterality: N/A;  . implantable loop recorder placement  10/24/2019   Medtronic Reveal [place] model [id number] (SN # [id number] ) implantable loop recorder implanted.  old device (RRT) removed  . LOOP RECORDER INSERTION N/A 04/21/2016   Procedure: Loop Recorder Insertion;  Surgeon: [name], MD;   Location: [place] [facility];  Service: Cardiovascular;  Laterality: N/A;  . MOHS SURGERY     ear  . SHOULDER OPEN ROTATOR CUFF REPAIR Left 1998  . TEE WITHOUT CARDIOVERSION N/A 08/25/2016   Procedure: TRANSESOPHAGEAL ECHOCARDIOGRAM (TEE);  Surgeon: Jerline Pain, MD;  Location: [facility] ENDOSCOPY;  Service: Cardiovascular;  Laterality: N/A;    Current Outpatient Medications  Medication Sig Dispense Refill  . ELIQUIS 5 MG TABS tablet TAKE ONE TABLET BY MOUTH TWICE DAILY 60 tablet 10  . ferrous sulfate 325 (65 FE) MG tablet Take 325 mg by mouth every Monday, Wednesday, and Friday.     . Probiotic Product (PROBIOTIC PO) Take 1 tablet by mouth daily.    . sildenafil (VIAGRA) 100 MG tablet 50 mg as needed.    . simvastatin (ZOCOR) 40 MG tablet TAKE 1 TABLET BY MOUTH ONCE DAILY 30 tablet 10  . furosemide (LASIX) 40 MG tablet Take 1 tablet (40 mg total) by mouth daily. 90 tablet 3   No current facility-administered medications for this visit.    Allergies:   Codeine   Social History: Social History   Socioeconomic History  . Marital status: Married    Spouse name: Not on file  . Number of children: 2  . Years of education: 71  . Highest education level: Not on file  Occupational History  . Occupation: Chemical engineer: OTHER    Comment: Retired  Tobacco Use  . Smoking status: Former Smoker    Packs/day: 1.00    Years: 17.00    Pack years: 17.00  Types: Cigarettes    Quit date: 08/02/1974    Years since quitting: 46.0  . Smokeless tobacco: Former Systems developer    Quit date: 1977  Vaping Use  . Vaping Use: Never used  Substance and Sexual Activity  . Alcohol use: Yes    Alcohol/week: 2.0 standard drinks    Types: 2 Cans of beer per week  . Drug use: No  . Sexual activity: Yes  Other Topics Concern  . Not on file  Social History Narrative   Patient is married with 2 children.   Patient is right handed.   Patient has 14 yrs of education.   Patient does not drink caffeine.    Social Determinants of Health   Financial Resource Strain: Not on file  Food Insecurity: Not on file  Transportation Needs: Not on file  Physical Activity: Not on file  Stress: Not on file  Social Connections: Not on file  Intimate Partner Violence: Not on file    Family History: Family History  Problem Relation Age of Onset  . Heart disease Mother   . Heart disease Father   . Other Father        Carotid Artery Stenosis  . Heart disease Brother      Review of Systems: All other systems reviewed and are otherwise negative except as noted above.  Physical Exam: Vitals:   08/06/20 0823  BP: 118/60  Pulse: 62  SpO2: 97%  Weight: 234 lb (106.1 kg)  Height: 5\' 11"  (1.803 m)     GEN- The patient is well appearing, alert and oriented x 3 today.   HEENT: normocephalic, atraumatic; sclera clear, conjunctiva pink; hearing intact; oropharynx clear; neck supple  Lungs- Clear to ausculation bilaterally, normal work of breathing.  No wheezes, rales, rhonchi Heart- Regular rate and rhythm, no murmurs, rubs or gallops  GI- soft, non-tender, non-distended, bowel sounds present  Extremities- no clubbing, cyanosis, or edema  MS- no significant deformity or atrophy Skin- warm and dry, no rash or lesion; PPM pocket well healed Psych- euthymic mood, full affect Neuro- strength and sensation are intact  PPM Interrogation- reviewed in detail today,  See PACEART report  EKG:  EKG is not ordered today.  Recent Labs: 10/17/2019: ALT 14; BUN 15; Creatinine, Ser 0.99; Hemoglobin 14.1; Platelets 244; Potassium 3.9; Sodium 136   Wt Readings from Last 3 Encounters:  08/06/20 234 lb (106.1 kg)  10/24/19 235 lb 11.2 oz (106.9 kg)  10/17/19 237 lb 9.6 oz (107.8 kg)     Other studies Reviewed: Additional studies/ records that were reviewed today include: Previous EP office notes, Previous remote checks, Most recent labwork.   Assessment and Plan:  1. Persistent atrial fibrillation s/p  Medtronic Loop recorder Normal device function See Pace Art report No changes today 7.8% burden  2. Mobitz I second degree AV block Stable  3. OSA Encouraged nightly CPAP use  4. HLD Managed on Zocor   Current medicines are reviewed at length with the patient today.   The patient does not have concerns regarding his medicines.  The following changes were made today:  none  Labs/ tests ordered today include:  Orders Placed This Encounter  Procedures  . Basic metabolic panel  . CBC  . CUP PACEART INCLINIC DEVICE CHECK   Disposition:   Follow up with Dr. Rayann Heman in 6 Months    Signed, Annamaria Helling  08/06/2020 8:56 AM  [facility] [address] [place] [address] [phone] (  office) [phone] (fax)

## 2020-08-06 ENCOUNTER — Other Ambulatory Visit: Payer: Self-pay

## 2020-08-06 ENCOUNTER — Encounter: Payer: Self-pay | Admitting: Student

## 2020-08-06 ENCOUNTER — Ambulatory Visit: Payer: Medicare Other | Admitting: Student

## 2020-08-06 VITALS — BP 118/60 | HR 62 | Ht 71.0 in | Wt 234.0 lb

## 2020-08-06 DIAGNOSIS — E7849 Other hyperlipidemia: Secondary | ICD-10-CM

## 2020-08-06 DIAGNOSIS — G4733 Obstructive sleep apnea (adult) (pediatric): Secondary | ICD-10-CM | POA: Diagnosis not present

## 2020-08-06 DIAGNOSIS — I441 Atrioventricular block, second degree: Secondary | ICD-10-CM | POA: Diagnosis not present

## 2020-08-06 DIAGNOSIS — I4819 Other persistent atrial fibrillation: Secondary | ICD-10-CM | POA: Diagnosis not present

## 2020-08-06 DIAGNOSIS — I5032 Chronic diastolic (congestive) heart failure: Secondary | ICD-10-CM

## 2020-08-06 LAB — CBC
Hematocrit: 43.8 % (ref 37.5–51.0)
Hemoglobin: 14.5 g/dL (ref 13.0–17.7)
MCH: 30 pg (ref 26.6–33.0)
MCHC: 33.1 g/dL (ref 31.5–35.7)
MCV: 91 fL (ref 79–97)
Platelets: 232 10*3/uL (ref 150–450)
RBC: 4.83 x10E6/uL (ref 4.14–5.80)
RDW: 12.9 % (ref 11.6–15.4)
WBC: 6.8 10*3/uL (ref 3.4–10.8)

## 2020-08-06 LAB — BASIC METABOLIC PANEL
BUN/Creatinine Ratio: 13 (ref 10–24)
BUN: 14 mg/dL (ref 8–27)
CO2: 25 mmol/L (ref 20–29)
Calcium: 8.8 mg/dL (ref 8.6–10.2)
Chloride: 101 mmol/L (ref 96–106)
Creatinine, Ser: 1.04 mg/dL (ref 0.76–1.27)
Glucose: 100 mg/dL — ABNORMAL HIGH (ref 65–99)
Potassium: 3.9 mmol/L (ref 3.5–5.2)
Sodium: 140 mmol/L (ref 134–144)
eGFR: 72 mL/min/{1.73_m2} (ref >59)

## 2020-08-06 LAB — CUP PACEART INCLINIC DEVICE CHECK
Date Time Interrogation Session: 20220524085354
Implantable Pulse Generator Implant Date: 20210810

## 2020-08-06 MED ORDER — FUROSEMIDE 40 MG PO TABS: 40.0000 mg | ORAL_TABLET | Freq: Every day | ORAL | 3 refills | Status: DC

## 2020-08-06 NOTE — Patient Instructions (Signed)
Medication Instructions:  Your physician recommends that you continue on your current medications as directed. Please refer to the Current Medication list given to you today.  *If you need a refill on your cardiac medications before your next appointment, please call your pharmacy*   Lab Work: TODAY: BMET, CBC  If you have labs (blood work) drawn today and your tests are completely normal, you will receive your results only by: . MyChart Message (if you have MyChart) OR . A paper copy in the mail If you have any lab test that is abnormal or we need to change your treatment, we will call you to review the results.   Follow-Up: At [facility], you and your health needs are our priority.  As part of our continuing mission to provide you with exceptional heart care, we have created designated Provider Care Teams.  These Care Teams include your primary Cardiologist (physician) and Advanced Practice Providers (APPs -  Physician Assistants and Nurse Practitioners) who all work together to provide you with the care you need, when you need it.  Your next appointment:   6 month(s)  The format for your next appointment:   In Person  Provider:   You may see James Allred, MD or one of the following Advanced Practice Providers on your designated Care Team:    Amber Seiler, NP  Renee Ursuy, PA-C  Michael "Andy" Tillery, PA-C    

## 2020-08-18 LAB — CUP PACEART REMOTE DEVICE CHECK
Date Time Interrogation Session: 20220603104629
Implantable Pulse Generator Implant Date: 20210810

## 2020-08-19 ENCOUNTER — Ambulatory Visit (INDEPENDENT_AMBULATORY_CARE_PROVIDER_SITE_OTHER): Payer: Medicare Other

## 2020-08-19 DIAGNOSIS — I480 Paroxysmal atrial fibrillation: Secondary | ICD-10-CM

## 2020-09-09 DIAGNOSIS — G4733 Obstructive sleep apnea (adult) (pediatric): Secondary | ICD-10-CM | POA: Diagnosis not present

## 2020-09-09 NOTE — Progress Notes (Signed)
Carelink Summary Report / Loop Recorder 

## 2020-09-22 LAB — CUP PACEART REMOTE DEVICE CHECK
Date Time Interrogation Session: 20220706104908
Implantable Pulse Generator Implant Date: 20210810

## 2020-09-23 ENCOUNTER — Ambulatory Visit (INDEPENDENT_AMBULATORY_CARE_PROVIDER_SITE_OTHER): Payer: Medicare Other

## 2020-09-23 DIAGNOSIS — I441 Atrioventricular block, second degree: Secondary | ICD-10-CM | POA: Diagnosis not present

## 2020-10-15 NOTE — Progress Notes (Signed)
Carelink Summary Report / Loop Recorder 

## 2020-10-28 ENCOUNTER — Ambulatory Visit (INDEPENDENT_AMBULATORY_CARE_PROVIDER_SITE_OTHER): Payer: Medicare Other

## 2020-10-28 DIAGNOSIS — I441 Atrioventricular block, second degree: Secondary | ICD-10-CM | POA: Diagnosis not present

## 2020-10-28 LAB — CUP PACEART REMOTE DEVICE CHECK
Date Time Interrogation Session: 20220808105655
Implantable Pulse Generator Implant Date: 20210810

## 2020-11-06 ENCOUNTER — Encounter: Payer: Self-pay | Admitting: Physician Assistant

## 2020-11-06 ENCOUNTER — Other Ambulatory Visit: Payer: Self-pay

## 2020-11-06 ENCOUNTER — Ambulatory Visit: Payer: Medicare Other | Admitting: Physician Assistant

## 2020-11-06 VITALS — BP 117/70 | HR 64 | Ht 71.0 in | Wt 233.6 lb

## 2020-11-06 DIAGNOSIS — I5032 Chronic diastolic (congestive) heart failure: Secondary | ICD-10-CM

## 2020-11-06 DIAGNOSIS — E782 Mixed hyperlipidemia: Secondary | ICD-10-CM

## 2020-11-06 DIAGNOSIS — I4819 Other persistent atrial fibrillation: Secondary | ICD-10-CM | POA: Diagnosis not present

## 2020-11-06 DIAGNOSIS — G4733 Obstructive sleep apnea (adult) (pediatric): Secondary | ICD-10-CM

## 2020-11-06 NOTE — Progress Notes (Signed)
Cardiology Office Note:    Date:  11/06/2020   ID:  Austin Taylor, DOB 07/18/1940, MRN [id number]  PCP:  Austin Dus, MD   [facility] Providers Cardiologist:  Austin Bergeron, MD Cardiology APP:  Austin Shi, PA-C  Electrophysiologist:  Austin Grayer, MD     Referring MD: Austin Dus, MD   Chief Complaint:  Follow-up (CHF, AFib)    Patient Profile:    Austin Taylor is a 80 y.o. male with:  Atrial fibrillation S/p PVI ablation in 6/18 S/p ILR - low burden (1.1% on interrogation 8/22) Mobitz I S/p ILR Re-implanted new device in 8/21 (HFpEF) heart failure with preserved ejection fraction  Chronic kidney disease  Hyperlipidemia  OSA  Obesity  Hx of CVA  Prior CV studies: Echocardiogram 11/06/2015 EF 65-70, no RWMA, GRII DD, mild LAE, normal RVSF, mild TR, PASP 33, trivial pericardial effusion  Carotid US 12/29/2013 Bilateral ICA 1-39  History of Present Illness: Mr. Austin Taylor was last seen by Austin Barrios, PA-C in 10/2019.  His ILR was EOL and Dr. Rayann Taylor explanted his old device and reimplanted a new device in 8/21.  He returns for f/u.  He is here with his wife.  He is doing well without chest pain, syncope, orthopnea, leg edema.  He does get short of breath with more moderate to extreme activities.  This is been a chronic symptom for the last several years without significant change.  He does remain quite active.  He went hiking 3 miles with his 55 year old grandson recently.    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    furosemide (LASIX) 40 MG tablet Take 1 tablet (40 mg total) by mouth daily.   Probiotic Product (PROBIOTIC PO) Take 1 tablet by mouth daily.   sildenafil (VIAGRA) 100 MG tablet 50 mg as needed.   simvastatin (ZOCOR) 40 MG tablet TAKE 1 TABLET BY MOUTH ONCE DAILY     Allergies:   Codeine   Social History   Tobacco Use   Smoking status: Former    Packs/day: 1.00    Years: 17.00    Pack years: 17.00    Types: Cigarettes    Quit date: 08/02/1974    Years since quitting: 46.2   Smokeless tobacco: Former    Quit date: 1977  Vaping Use   Vaping Use: Never used  Substance Use Topics   Alcohol use: Yes    Alcohol/week: 2.0 standard drinks    Types: 2 Cans of beer per week   Drug use: No     Family Hx: The patient's family history includes Heart disease in his brother, father, and mother; Other in his father.  Review of Systems  Gastrointestinal:  Negative for hematochezia and melena.  Genitourinary:  Negative for hematuria.    EKGs/Labs/Other Test Reviewed:    EKG:  EKG is  ordered today.  The ekg ordered today demonstrates NSR, HR 64, leftward axis, first-degree AV block, parable 294 ms, QTC 466, junctional escape beat  Recent Labs: 08/06/2020: BUN 14; Creatinine, Ser 1.04; Hemoglobin 14.5; Platelets 232; Potassium 3.9; Sodium 140   Recent Lipid Panel Lab Results  Component Value Date/Time   CHOL 150 10/17/2019 08:51 AM   TRIG 122 10/17/2019 08:51 AM   HDL 37 (L) 10/17/2019 08:51 AM   LDLCALC 91 10/17/2019 08:51 AM   Labs obtained through [facility] - personally reviewed and interpreted: 07/17/2020: HDL 48, LDL 70, total cholesterol 133, triglycerides 75, creatinine 1.04, hemoglobin 14.5, K+ on 3.9    Risk Assessment/Calculations:    CHA2DS2-VASc Score = 5  This indicates a 7.2% annual risk of stroke. The patient's score is based upon: CHF History: Yes HTN History: No Diabetes History:  No Stroke History: Yes Vascular Disease History: No Age Score: 2 Gender Score: 0    Physical Exam:    VS:  BP 117/70   Pulse 64   Ht '5\' 11"'$  (1.803 m)   Wt 233 lb 9.6 oz (106 kg)   SpO2 96%   BMI 32.58 kg/m     Wt Readings from Last 3 Encounters:  11/06/20 233 lb 9.6 oz (106 kg)  08/06/20 234 lb (106.1 kg)  10/24/19 235 lb 11.2 oz (106.9 kg)     Constitutional:      Appearance: Healthy appearance. Not in distress.  Neck:     Vascular: JVD normal.  Pulmonary:     Effort: Pulmonary effort is normal.     Breath sounds: No wheezing. No rales.  Cardiovascular:     Normal rate. Regular rhythm. Normal S1. Normal S2.      Murmurs: There is no murmur.  Edema:    Peripheral edema absent.  Abdominal:     Palpations: Abdomen is soft.  Skin:    General: Skin is warm and dry.  Neurological:     General: No focal deficit present.     Mental Status: Alert and oriented to person, place and time.     Cranial Nerves: Cranial nerves are intact.        ASSESSMENT & PLAN:    1. Chronic heart failure with preserved ejection fraction (HCC) EF 65-70 by echocardiogram in 8/17.  NYHA II.  Volume status stable.  Continue furosemide 40 mg daily.  Recent creatinine, potassium stable.  2. Persistent atrial fibrillation (HCC) Maintaining sinus rhythm.  Most recent loop recorder interrogation demonstrated 1.1% atrial fibrillation burden.  He is tolerating anticoagulation.  Recent hemoglobin, creatinine normal.  Continue Apixaban 5 mg twice daily.  Follow-up with Dr. Rayann Taylor as planned.  3. OSA (obstructive sleep apnea) He uses CPAP nightly.  4. Mixed hyperlipidemia Managed by primary care.         Dispo:  Return in about 1 year (around 11/06/2021) for Routine Follow Up, w/ Dr. Johney Taylor, or Austin Dopp, PA-C.   Medication Adjustments/Labs and Tests Ordered: Current medicines are reviewed at length with the patient today.  Concerns regarding medicines are outlined above.  Tests  Ordered: Orders Placed This Encounter  Procedures   EKG 12-Lead    Medication Changes: No orders of the defined types were placed in this encounter.   Signed, Austin Dopp, PA-C  11/06/2020 2:17 PM    [facility] [place], [place]  [address] Phone: [phone]; Fax: [phone]

## 2020-11-06 NOTE — Patient Instructions (Signed)
Medication Instructions:   Your physician recommends that you continue on your current medications as directed. Please refer to the Current Medication list given to you today.  *If you need a refill on your cardiac medications before your next appointment, please call your pharmacy*   Lab Work:  None  If you have labs (blood work) drawn today and your tests are completely normal, you will receive your results only by: [place] (if you have MyChart) OR A paper copy in the mail If you have any lab test that is abnormal or we need to change your treatment, we will call you to review the results.   Testing/Procedures:  -None  Follow-Up: At [facility], you and your health needs are our priority.  As part of our continuing mission to provide you with exceptional heart care, we have created designated Provider Care Teams.  These Care Teams include your primary Cardiologist (physician) and Advanced Practice Providers (APPs -  Physician Assistants and Nurse Practitioners) who all work together to provide you with the care you need, when you need it.  We recommend signing up for the patient portal called "MyChart".  Sign up information is provided on this After Visit Summary.  MyChart is used to connect with patients for Virtual Visits (Telemedicine).  Patients are able to view lab/test results, encounter notes, upcoming appointments, etc.  Non-urgent messages can be sent to your provider as well.   To learn more about what you can do with MyChart, go to [email or web address].    Your next appointment:   1 year(s)  The format for your next appointment:   In Person  Provider:   You may see Dr. Johney Frame or one of the following Advanced Practice Providers on your designated Care Team:   Richardson Dopp, PA-C    Other Instructions Your physician wants you to follow-up in: 1 year with Dr. Johney Frame or Richardson Dopp, PA-C.  You will receive a reminder letter in the mail two months  in advance. If you don't receive a letter, please call our office to schedule the follow-up appointment.

## 2020-11-15 NOTE — Progress Notes (Signed)
Carelink Summary Report / Loop Recorder 

## 2020-12-02 ENCOUNTER — Ambulatory Visit (INDEPENDENT_AMBULATORY_CARE_PROVIDER_SITE_OTHER): Payer: Medicare Other

## 2020-12-02 DIAGNOSIS — I441 Atrioventricular block, second degree: Secondary | ICD-10-CM

## 2020-12-03 LAB — CUP PACEART REMOTE DEVICE CHECK
Date Time Interrogation Session: 20220910105549
Implantable Pulse Generator Implant Date: 20210810

## 2020-12-06 NOTE — Progress Notes (Signed)
Carelink Summary Report / Loop Recorder 

## 2020-12-16 ENCOUNTER — Other Ambulatory Visit: Payer: Self-pay | Admitting: Internal Medicine

## 2020-12-16 DIAGNOSIS — I480 Paroxysmal atrial fibrillation: Secondary | ICD-10-CM

## 2020-12-16 DIAGNOSIS — E785 Hyperlipidemia, unspecified: Secondary | ICD-10-CM

## 2020-12-16 DIAGNOSIS — I5033 Acute on chronic diastolic (congestive) heart failure: Secondary | ICD-10-CM

## 2020-12-16 NOTE — Telephone Encounter (Signed)
Prescription refill request for Eliquis received.  Indication: aflutter Last office visit: Kathlen Mody 11/06/2020 Scr: 1.04, 08/06/2020 Age: 80 yo  Weight: 106 kg   Refill sent.

## 2020-12-31 DIAGNOSIS — C4442 Squamous cell carcinoma of skin of scalp and neck: Secondary | ICD-10-CM | POA: Diagnosis not present

## 2020-12-31 DIAGNOSIS — D0421 Carcinoma in situ of skin of right ear and external auricular canal: Secondary | ICD-10-CM | POA: Diagnosis not present

## 2020-12-31 DIAGNOSIS — C44222 Squamous cell carcinoma of skin of right ear and external auricular canal: Secondary | ICD-10-CM | POA: Diagnosis not present

## 2021-01-01 ENCOUNTER — Ambulatory Visit (INDEPENDENT_AMBULATORY_CARE_PROVIDER_SITE_OTHER): Payer: Medicare Other

## 2021-01-01 DIAGNOSIS — I441 Atrioventricular block, second degree: Secondary | ICD-10-CM

## 2021-01-02 LAB — CUP PACEART REMOTE DEVICE CHECK
Date Time Interrogation Session: 20221013105623
Implantable Pulse Generator Implant Date: 20210810

## 2021-01-06 NOTE — Progress Notes (Signed)
Carelink Summary Report / Loop Recorder 

## 2021-02-03 ENCOUNTER — Ambulatory Visit (INDEPENDENT_AMBULATORY_CARE_PROVIDER_SITE_OTHER): Payer: Medicare Other

## 2021-02-03 DIAGNOSIS — I441 Atrioventricular block, second degree: Secondary | ICD-10-CM

## 2021-02-04 DIAGNOSIS — C44222 Squamous cell carcinoma of skin of right ear and external auricular canal: Secondary | ICD-10-CM | POA: Diagnosis not present

## 2021-02-04 LAB — CUP PACEART REMOTE DEVICE CHECK
Date Time Interrogation Session: 20221115095731
Implantable Pulse Generator Implant Date: 20210810

## 2021-02-05 ENCOUNTER — Other Ambulatory Visit: Payer: Self-pay

## 2021-02-05 ENCOUNTER — Ambulatory Visit: Payer: Medicare Other | Admitting: Internal Medicine

## 2021-02-05 VITALS — BP 112/64 | HR 56 | Ht 71.0 in | Wt 238.0 lb

## 2021-02-05 DIAGNOSIS — I441 Atrioventricular block, second degree: Secondary | ICD-10-CM

## 2021-02-05 DIAGNOSIS — G4733 Obstructive sleep apnea (adult) (pediatric): Secondary | ICD-10-CM | POA: Diagnosis not present

## 2021-02-05 DIAGNOSIS — I4819 Other persistent atrial fibrillation: Secondary | ICD-10-CM | POA: Diagnosis not present

## 2021-02-05 DIAGNOSIS — E785 Hyperlipidemia, unspecified: Secondary | ICD-10-CM | POA: Diagnosis not present

## 2021-02-05 DIAGNOSIS — I5032 Chronic diastolic (congestive) heart failure: Secondary | ICD-10-CM | POA: Diagnosis not present

## 2021-02-05 NOTE — Progress Notes (Signed)
PCP: Maury Dus, MD   Primary EP: Dr Rayann Heman  Austin Taylor is a 80 y.o. male who presents today for routine electrophysiology followup.  Since last being seen in our clinic, the patient reports doing very well.  Today, he denies symptoms of palpitations, chest pain, shortness of breath,  dizziness, presyncope, or syncope.  + edema.  The patient is otherwise without complaint today.   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 ENDOSCOPY;  Service: Cardiovascular;  Laterality: N/A;   implantable loop recorder placement  10/24/2019   Medtronic Reveal [place] model [id number] (SN # [id number] ) implantable loop recorder implanted.  old device (RRT) removed   LOOP RECORDER INSERTION N/A 04/21/2016   Procedure: Loop Recorder Insertion;  Surgeon: [name], MD;  Location: [place] [facility];  Service: Cardiovascular;  Laterality: N/A;   MOHS SURGERY     ear   SHOULDER OPEN ROTATOR CUFF REPAIR Left 1998   TEE WITHOUT CARDIOVERSION N/A 08/25/2016   Procedure: TRANSESOPHAGEAL ECHOCARDIOGRAM (TEE);  Surgeon: Jerline Pain, MD;  Location: [facility] ENDOSCOPY;  Service:  Cardiovascular;  Laterality: N/A;    ROS- all systems are reviewed and negatives except as per HPI above  Current Outpatient Medications  Medication Sig Dispense Refill   apixaban (ELIQUIS) 5 MG TABS tablet TAKE ONE TABLET BY MOUTH TWICE DAILY 60 tablet 5   doxycycline (VIBRAMYCIN) 100 MG capsule Take 100 mg by mouth 2 (two) times daily.     ferrous sulfate 325 (65 FE) MG tablet Take 325 mg by mouth every Monday, Wednesday, and Friday.      furosemide (LASIX) 40 MG tablet Take 1 tablet (40 mg total) by mouth daily. 90 tablet 3   Probiotic Product (PROBIOTIC PO) Take 1 tablet by mouth daily.     sildenafil (VIAGRA) 100 MG tablet 50 mg as needed.     simvastatin (ZOCOR) 40 MG tablet TAKE 1 TABLET BY MOUTH ONCE DAILY 30 tablet 10   No current facility-administered medications for this visit.    Physical Exam: Vitals:   02/05/21 1416  BP: 112/64  Pulse: (!) 56  SpO2: 94%  Weight: 238 lb (108 kg)  Height: 5\' 11"  (1.803 m)    GEN- The patient is well appearing, alert and oriented x 3 today.   Head- normocephalic, atraumatic Eyes-  Sclera clear, conjunctiva pink Ears- hearing intact Oropharynx- clear Lungs- Clear to ausculation bilaterally, normal work of breathing Heart- Regular rate and rhythm, no murmurs, rubs or gallops, PMI not laterally displaced GI- soft, NT, ND, + BS Extremities- no clubbing, cyanosis, +1 edema  Wt  Readings from Last 3 Encounters:  02/05/21 238 lb (108 kg)  11/06/20 233 lb 9.6 oz (106 kg)  08/06/20 234 lb (106.1 kg)    EKG tracing ordered today is personally reviewed and shows sinus  Assessment and Plan:  Persistent afib Well controlled  by ILR AF butrden is 3.7% He is on eliquis Cbc, bmet today  2. Mobitz I second degree AV block Stable No change required today  3. OSA Compliance with CPAP advised  4. Acute on chronic diastolic dysfunction Elevated fluid today Sodium restriction and daily weights advised Increase lasix to 40mg  BID x 3  days then resume prior dosing Pro bnp, cbc and bmet today May benefit from Alleviate HF.  We will screen with Pro BNP.   Return to see EP APP in a year  [name] MD, [facility] 02/05/2021 2:23 PM

## 2021-02-05 NOTE — Patient Instructions (Addendum)
Medication Instructions:  Increase your Lasix to 40 mg two times a day for 3 days then go back to daily. Your physician recommends that you continue on your current medications as directed. Please refer to the Current Medication list given to you today. *If you need a refill on your cardiac medications before your next appointment, please call your pharmacy*  Lab Work: CBC, BMP, ProBNP If you have labs (blood work) drawn today and your tests are completely normal, you will receive your results only by: [place] (if you have MyChart) OR A paper copy in the mail If you have any lab test that is abnormal or we need to change your treatment, we will call you to review the results.  Testing/Procedures: None.  Follow-Up: At [facility], you and your health needs are our priority.  As part of our continuing mission to provide you with exceptional heart care, we have created designated Provider Care Teams.  These Care Teams include your primary Cardiologist (physician) and Advanced Practice Providers (APPs -  Physician Assistants and Nurse Practitioners) who all work together to provide you with the care you need, when you need it.  Your physician wants you to follow-up in: 12 months with  [name], MD or one of the following Advanced Practice Providers on your designated Care Team:     Legrand Como "Jonni Sanger" Chalmers Cater, [place]   You will receive a reminder letter in the mail two months in advance. If you don't receive a letter, please call our office to schedule the follow-up appointment.  We recommend signing up for the patient portal called "MyChart".  Sign up information is provided on this After Visit Summary.  MyChart is used to connect with patients for Virtual Visits (Telemedicine).  Patients are able to view lab/test results, encounter notes, upcoming appointments, etc.  Non-urgent messages can be sent to your provider as well.   To learn more about what you can do with MyChart, go to  [email or web address].    Any Other Special Instructions Will Be Listed Below (If Applicable).  Low-Sodium Eating Plan (2 grams a day)  Sodium, which is an element that makes up salt, helps you maintain a healthy balance of fluids in your body. Too much sodium can increase your blood pressure and cause fluid and waste to be held in your body. Your health care provider or dietitian may recommend following this plan if you have high blood pressure (hypertension), kidney disease, liver disease, or heart failure. Eating less sodium can help lower your blood pressure, reduce swelling, and protect your heart, liver, and kidneys. What are tips for following this plan? Reading food labels The Nutrition Facts label lists the amount of sodium in one serving of the food. If you eat more than one serving, you must multiply the listed amount of sodium by the number of servings. Choose foods with less than 140 mg of sodium per serving. Avoid foods with 300 mg of sodium or more per serving. Shopping  Look for lower-sodium products, often labeled as "low-sodium" or "no salt added." Always check the sodium content, even if foods are labeled as "unsalted" or "no salt added." Buy fresh foods. Avoid canned foods and pre-made or frozen meals. Avoid canned, cured, or processed meats. Buy breads that have less than 80 mg of sodium per slice. Cooking  Eat more home-cooked food and less restaurant, buffet, and fast food. Avoid adding salt when cooking. Use salt-free seasonings or herbs instead of table salt or sea salt. Check  with your health care provider or pharmacist before using salt substitutes. Cook with plant-based oils, such as canola, sunflower, or olive oil. Meal planning When eating at a restaurant, ask that your food be prepared with less salt or no salt, if possible. Avoid dishes labeled as brined, pickled, cured, smoked, or made with soy sauce, miso, or teriyaki sauce. Avoid foods that contain MSG  (monosodium glutamate). MSG is sometimes added to [place] food, bouillon, and some canned foods. Make meals that can be grilled, baked, poached, roasted, or steamed. These are generally made with less sodium. General information Most people on this plan should limit their sodium intake to 1,500-2,000 mg (milligrams) of sodium each day. What foods should I eat? Fruits Fresh, frozen, or canned fruit. Fruit juice. Vegetables Fresh or frozen vegetables. "No salt added" canned vegetables. "No salt added" tomato sauce and paste. Low-sodium or reduced-sodium tomato and vegetable juice. Grains Low-sodium cereals, including oats, puffed wheat and rice, and shredded wheat. Low-sodium crackers. Unsalted rice. Unsalted pasta. Low-sodium bread. Whole-grain breads and whole-grain pasta. Meats and other proteins Fresh or frozen (no salt added) meat, poultry, seafood, and fish. Low-sodium canned tuna and salmon. Unsalted nuts. Dried peas, beans, and lentils without added salt. Unsalted canned beans. Eggs. Unsalted nut butters. Dairy Milk. Soy milk. Cheese that is naturally low in sodium, such as ricotta cheese, fresh mozzarella, or Swiss cheese. Low-sodium or reduced-sodium cheese. Cream cheese. Yogurt. Seasonings and condiments Fresh and dried herbs and spices. Salt-free seasonings. Low-sodium mustard and ketchup. Sodium-free salad dressing. Sodium-free light mayonnaise. Fresh or refrigerated horseradish. Lemon juice. Vinegar. Other foods Homemade, reduced-sodium, or low-sodium soups. Unsalted popcorn and pretzels. Low-salt or salt-free chips. The items listed above may not be a complete list of foods and beverages you can eat. Contact a dietitian for more information. What foods should I avoid? Vegetables Sauerkraut, pickled vegetables, and relishes. Olives. [place] fries. Onion rings. Regular canned vegetables (not low-sodium or reduced-sodium). Regular canned tomato sauce and paste (not low-sodium or  reduced-sodium). Regular tomato and vegetable juice (not low-sodium or reduced-sodium). Frozen vegetables in sauces. Grains Instant hot cereals. Bread stuffing, pancake, and biscuit mixes. Croutons. Seasoned rice or pasta mixes. Noodle soup cups. Boxed or frozen macaroni and cheese. Regular salted crackers. Self-rising flour. Meats and other proteins Meat or fish that is salted, canned, smoked, spiced, or pickled. Precooked or cured meat, such as sausages or meat loaves. Berniece Salines. Ham. Pepperoni. Hot dogs. Corned beef. Chipped beef. Salt pork. Jerky. Pickled herring. Anchovies and sardines. Regular canned tuna. Salted nuts. Dairy Processed cheese and cheese spreads. Hard cheeses. Cheese curds. Blue cheese. Feta cheese. String cheese. Regular cottage cheese. Buttermilk. Canned milk. Fats and oils Salted butter. Regular margarine. Ghee. Bacon fat. Seasonings and condiments Onion salt, garlic salt, seasoned salt, table salt, and sea salt. Canned and packaged gravies. Worcestershire sauce. Tartar sauce. Barbecue sauce. Teriyaki sauce. Soy sauce, including reduced-sodium. Steak sauce. Fish sauce. Oyster sauce. Cocktail sauce. Horseradish that you find on the shelf. Regular ketchup and mustard. Meat flavorings and tenderizers. Bouillon cubes. Hot sauce. Pre-made or packaged marinades. Pre-made or packaged taco seasonings. Relishes. Regular salad dressings. Salsa. Other foods Salted popcorn and pretzels. Corn chips and puffs. Potato and tortilla chips. Canned or dried soups. Pizza. Frozen entrees and pot pies. The items listed above may not be a complete list of foods and beverages you should avoid. Contact a dietitian for more information. Summary Eating less sodium can help lower your blood pressure, reduce swelling, and protect your heart,  liver, and kidneys. Most people on this plan should limit their sodium intake to 1,500-2,000 mg (milligrams) of sodium each day. Canned, boxed, and frozen foods are high  in sodium. Restaurant foods, fast foods, and pizza are also very high in sodium. You also get sodium by adding salt to food. Try to cook at home, eat more fresh fruits and vegetables, and eat less fast food and canned, processed, or prepared foods. This information is not intended to replace advice given to you by your health care provider. Make sure you discuss any questions you have with your health care provider. Document Revised: 04/07/2019 Document Reviewed: 02/01/2019 Elsevier Patient Education  2022 [employer].

## 2021-02-06 LAB — BASIC METABOLIC PANEL
BUN/Creatinine Ratio: 16 (ref 10–24)
BUN: 17 mg/dL (ref 8–27)
CO2: 25 mmol/L (ref 20–29)
Calcium: 8.9 mg/dL (ref 8.6–10.2)
Chloride: 102 mmol/L (ref 96–106)
Creatinine, Ser: 1.06 mg/dL (ref 0.76–1.27)
Glucose: 119 mg/dL — ABNORMAL HIGH (ref 70–99)
Potassium: 3.8 mmol/L (ref 3.5–5.2)
Sodium: 139 mmol/L (ref 134–144)
eGFR: 71 mL/min/{1.73_m2} (ref >59)

## 2021-02-06 LAB — CBC
Hematocrit: 42.2 % (ref 37.5–51.0)
Hemoglobin: 14.4 g/dL (ref 13.0–17.7)
MCH: 30.5 pg (ref 26.6–33.0)
MCHC: 34.1 g/dL (ref 31.5–35.7)
MCV: 89 fL (ref 79–97)
Platelets: 255 10*3/uL (ref 150–450)
RBC: 4.72 x10E6/uL (ref 4.14–5.80)
RDW: 12.6 % (ref 11.6–15.4)
WBC: 7.4 10*3/uL (ref 3.4–10.8)

## 2021-02-06 LAB — PRO B NATRIURETIC PEPTIDE: NT-Pro BNP: 195 pg/mL (ref 0–486)

## 2021-02-11 DIAGNOSIS — H524 Presbyopia: Secondary | ICD-10-CM | POA: Diagnosis not present

## 2021-02-11 DIAGNOSIS — D0421 Carcinoma in situ of skin of right ear and external auricular canal: Secondary | ICD-10-CM | POA: Diagnosis not present

## 2021-02-11 DIAGNOSIS — H25013 Cortical age-related cataract, bilateral: Secondary | ICD-10-CM | POA: Diagnosis not present

## 2021-02-11 NOTE — Progress Notes (Signed)
Carelink Summary Report / Loop Recorder 

## 2021-03-04 LAB — CUP PACEART REMOTE DEVICE CHECK
Date Time Interrogation Session: 20221218100124
Implantable Pulse Generator Implant Date: 20210810

## 2021-03-12 ENCOUNTER — Ambulatory Visit (INDEPENDENT_AMBULATORY_CARE_PROVIDER_SITE_OTHER): Payer: Medicare Other

## 2021-03-12 DIAGNOSIS — I4819 Other persistent atrial fibrillation: Secondary | ICD-10-CM | POA: Diagnosis not present

## 2021-03-23 IMAGING — CT CT ABDOMEN AND PELVIS WITH CONTRAST
2 of 5 series · 15 of 46 positions shown, 17 images · IV contrast (ISOVUE)
Comparison: None.

CLINICAL DATA: Fever with lower abdominal pain.

EXAM:
CT ABDOMEN AND PELVIS WITH CONTRAST
TECHNIQUE: Multidetector CT imaging of the abdomen and pelvis was performed
using the standard protocol following bolus administration of
intravenous contrast.
CONTRAST:  100mL OMNIPAQUE IOHEXOL 300 MG/ML  SOLN

[Series 2: axial st · axial · 0.91mm/px · z∈[-561,-136]mm · 12 of 99 slices shown, 14 images]
[im 7/99  soft-tissue]
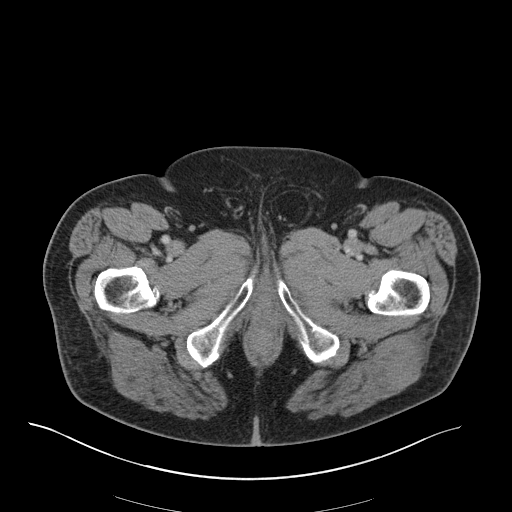
[im 7/99  bone]
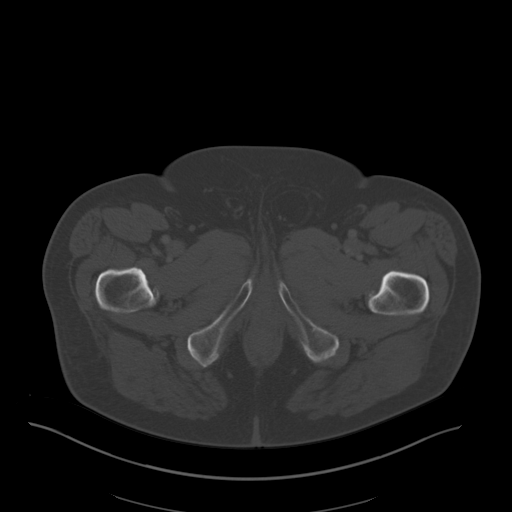
[im 13/99  soft-tissue]
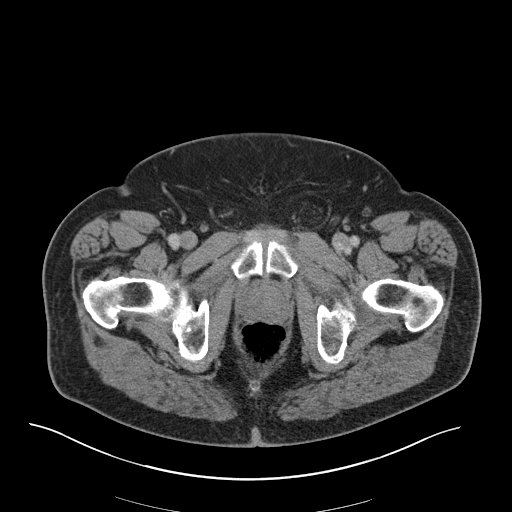
[im 25/99  soft-tissue]
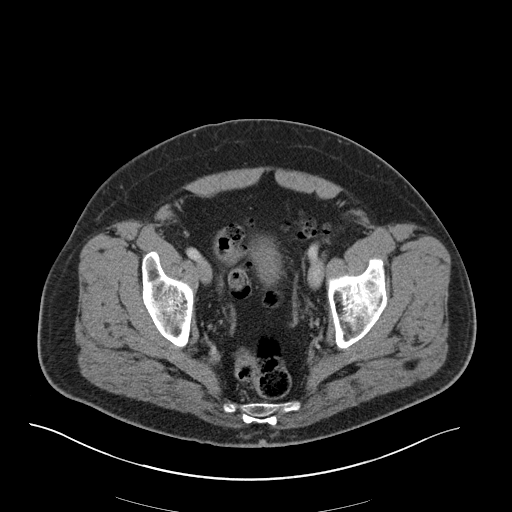
[im 31/99  soft-tissue]
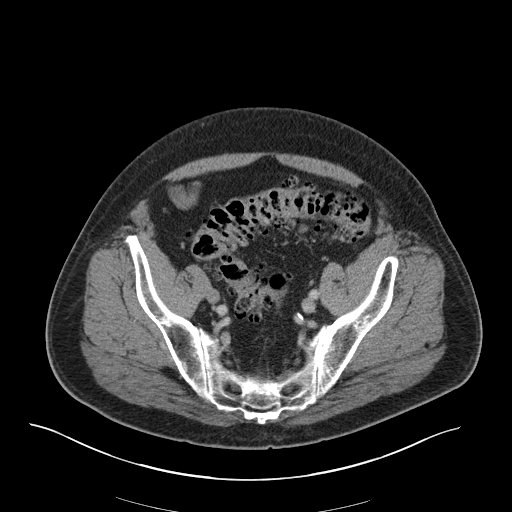
[im 37/99  soft-tissue]
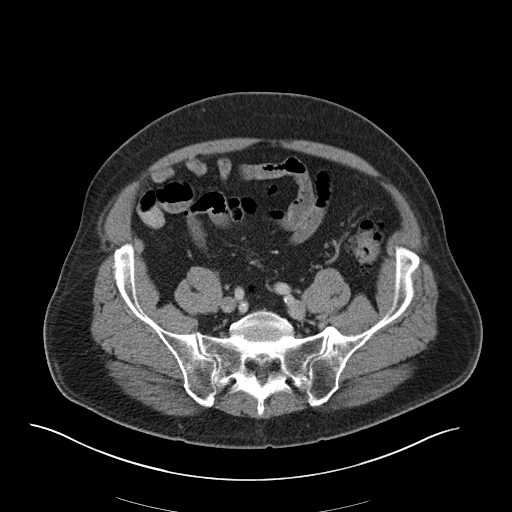
[im 43/99  soft-tissue]
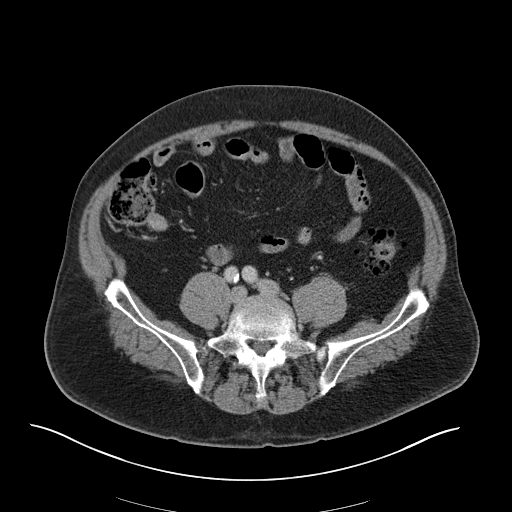
[im 56/99  soft-tissue]
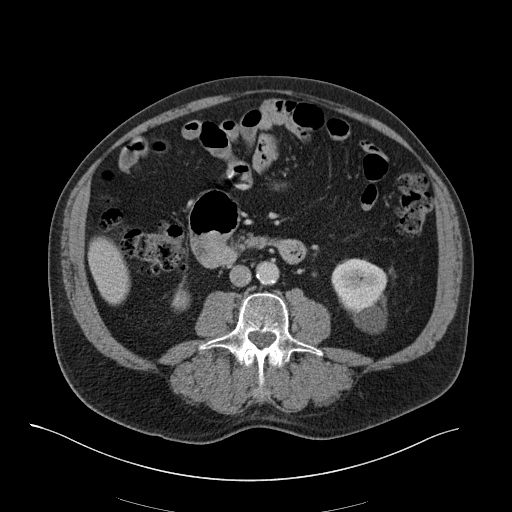
[im 62/99  soft-tissue]
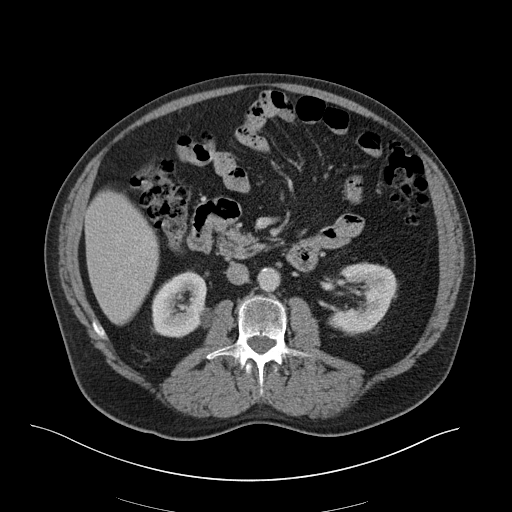
[im 68/99  soft-tissue]
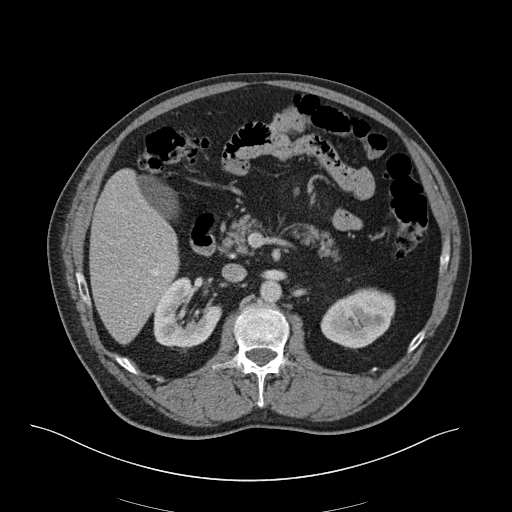
[im 68/99  bone]
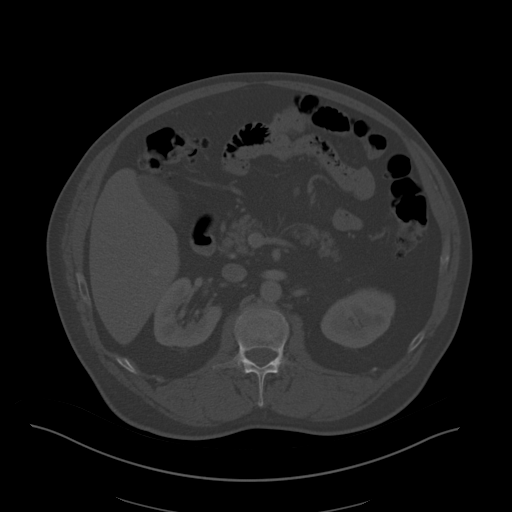
[im 74/99  soft-tissue]
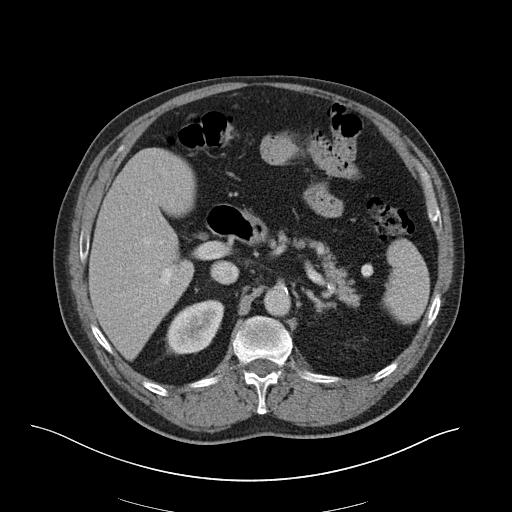
[im 86/99  soft-tissue]
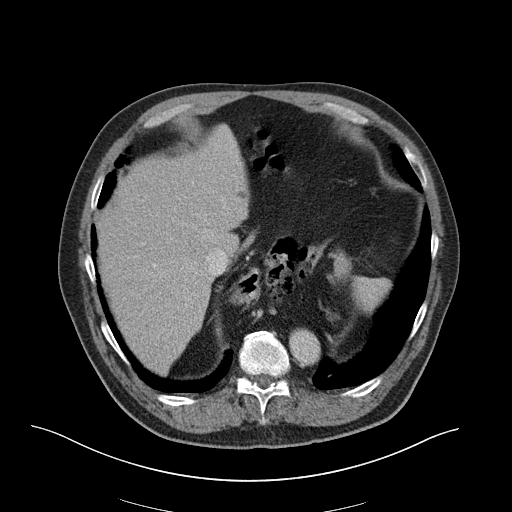
[im 92/99  soft-tissue]
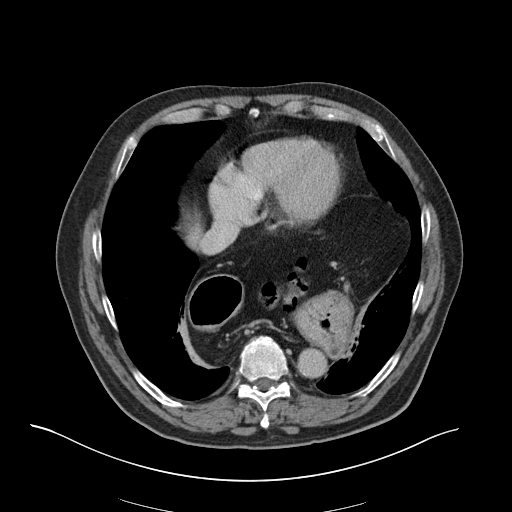

[Series 5: coronal st · coronal · 0.81mm/px · 3 of 171 slices shown]
[im 57/171  soft-tissue]
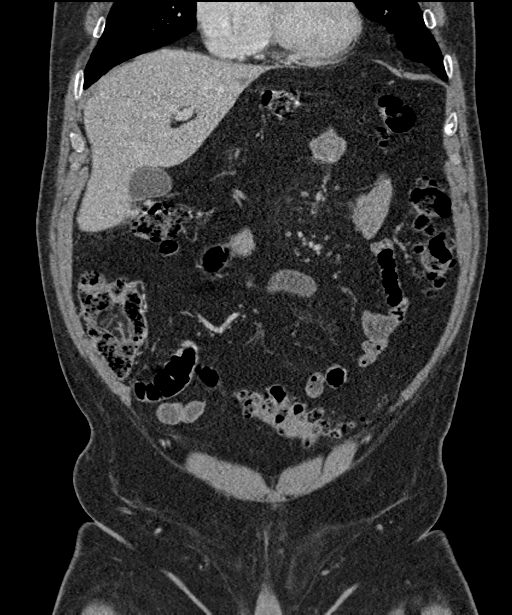
[im 76/171  soft-tissue]
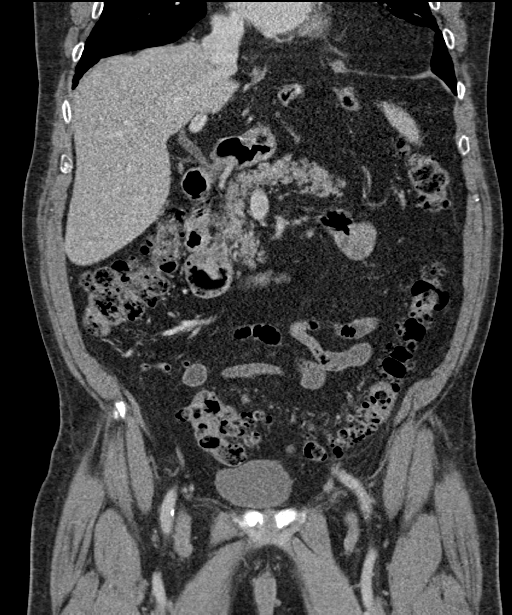
[im 95/171  soft-tissue]
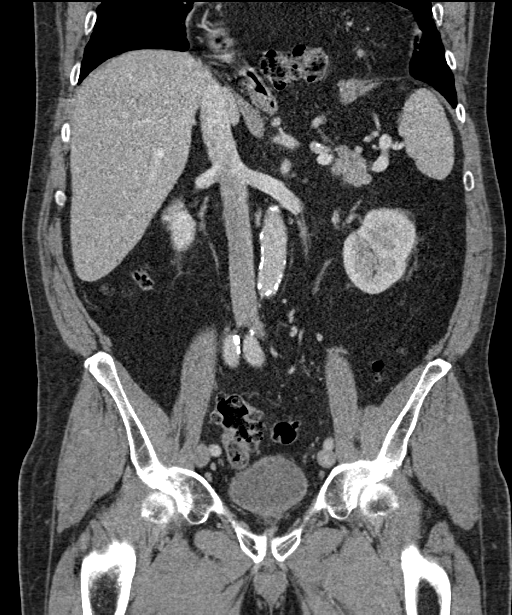

[15 of 46 positions shown; findings below may reference images not displayed]

FINDINGS: Lower chest: There is a 5 mm pulmonary nodule in the right middle
lobe (axial series 4, image 21). There is scarring versus
atelectasis involving the left lower lobe.The heart size is normal.

Hepatobiliary: The liver is normal. Cholelithiasis without acute
inflammation.There is no biliary ductal dilation.

Pancreas: Normal contours without ductal dilatation. No
peripancreatic fluid collection.

Spleen: No splenic laceration or hematoma.

Adrenals/Urinary Tract:

--Adrenal glands: No adrenal hemorrhage.

--Right kidney/ureter: There appears to be an at least partially
duplicated right-sided collecting system. There is no right-sided
hydronephrosis.

--Left kidney/ureter: No hydronephrosis or perinephric hematoma.

--Urinary bladder: Unremarkable.

Stomach/Bowel:

--Stomach/Duodenum: There is a large hiatal hernia with the majority
of the stomach herniated into the thoracic cavity.

--Small bowel: No dilatation or inflammation.

--Colon: There is severe sigmoid diverticulosis. There is some mild
inflammation adjacent to the proximal sigmoid colon (axial series 2,
image 66).

--Appendix: Normal.

Vascular/Lymphatic: Atherosclerotic calcification is present within
the non-aneurysmal abdominal aorta, without hemodynamically
significant stenosis.

--No retroperitoneal lymphadenopathy.

--there are tiny but prominent mesenteric lymph nodes.

--No pelvic or inguinal lymphadenopathy.

Reproductive: The prostate gland is enlarged.

Other: No ascites or free air. There are bilateral fat containing
inguinal hernias, left greater than right.

Musculoskeletal. No acute displaced fractures.
IMPRESSION: 1. Severe sigmoid diverticulosis with possible early sigmoid
diverticulitis in the appropriate clinical setting.
2. There is cholelithiasis without secondary signs of acute
cholecystitis.
3. Large hiatal hernia with the majority of the stomach herniated
into the thoracic cavity. This hernia also contains a small portion
of the transverse colon.
4. 5 mm pulmonary nodule in the right middle lobe. No follow-up
needed if patient is low-risk. Non-contrast chest CT can be
considered in 12 months if patient is high-risk. This recommendation
follows the consensus statement: Guidelines for Management of
Incidental Pulmonary Nodules Detected on CT Images: From the

Aortic Atherosclerosis (ZK0O2-SFO.O).

## 2021-03-24 NOTE — Progress Notes (Signed)
Carelink Summary Report / Loop Recorder 

## 2021-04-14 ENCOUNTER — Ambulatory Visit (INDEPENDENT_AMBULATORY_CARE_PROVIDER_SITE_OTHER): Payer: Medicare Other

## 2021-04-14 DIAGNOSIS — I441 Atrioventricular block, second degree: Secondary | ICD-10-CM

## 2021-04-14 LAB — CUP PACEART REMOTE DEVICE CHECK
Date Time Interrogation Session: 20230129232508
Implantable Pulse Generator Implant Date: 20210810

## 2021-04-22 NOTE — Progress Notes (Signed)
Carelink Summary Report / Loop Recorder 

## 2021-05-01 DIAGNOSIS — I509 Heart failure, unspecified: Secondary | ICD-10-CM | POA: Diagnosis not present

## 2021-05-01 DIAGNOSIS — E7800 Pure hypercholesterolemia, unspecified: Secondary | ICD-10-CM | POA: Diagnosis not present

## 2021-05-19 ENCOUNTER — Ambulatory Visit (INDEPENDENT_AMBULATORY_CARE_PROVIDER_SITE_OTHER): Payer: Medicare Other

## 2021-05-19 DIAGNOSIS — I441 Atrioventricular block, second degree: Secondary | ICD-10-CM

## 2021-05-19 LAB — CUP PACEART REMOTE DEVICE CHECK
Date Time Interrogation Session: 20230303232730
Implantable Pulse Generator Implant Date: 20210810

## 2021-05-28 NOTE — Progress Notes (Signed)
Carelink Summary Report / Loop Recorder 

## 2021-06-19 DIAGNOSIS — Z85828 Personal history of other malignant neoplasm of skin: Secondary | ICD-10-CM | POA: Diagnosis not present

## 2021-06-19 DIAGNOSIS — D1801 Hemangioma of skin and subcutaneous tissue: Secondary | ICD-10-CM | POA: Diagnosis not present

## 2021-06-19 DIAGNOSIS — L570 Actinic keratosis: Secondary | ICD-10-CM | POA: Diagnosis not present

## 2021-06-19 DIAGNOSIS — L821 Other seborrheic keratosis: Secondary | ICD-10-CM | POA: Diagnosis not present

## 2021-06-19 DIAGNOSIS — L814 Other melanin hyperpigmentation: Secondary | ICD-10-CM | POA: Diagnosis not present

## 2021-06-19 DIAGNOSIS — C44629 Squamous cell carcinoma of skin of left upper limb, including shoulder: Secondary | ICD-10-CM | POA: Diagnosis not present

## 2021-06-23 ENCOUNTER — Ambulatory Visit (INDEPENDENT_AMBULATORY_CARE_PROVIDER_SITE_OTHER): Payer: Medicare Other

## 2021-06-23 DIAGNOSIS — I441 Atrioventricular block, second degree: Secondary | ICD-10-CM

## 2021-06-24 LAB — CUP PACEART REMOTE DEVICE CHECK
Date Time Interrogation Session: 20230406003741
Implantable Pulse Generator Implant Date: 20210810

## 2021-07-08 NOTE — Progress Notes (Signed)
Carelink Summary Report / Loop Recorder 

## 2021-07-22 DIAGNOSIS — D509 Iron deficiency anemia, unspecified: Secondary | ICD-10-CM | POA: Diagnosis not present

## 2021-07-22 DIAGNOSIS — E7800 Pure hypercholesterolemia, unspecified: Secondary | ICD-10-CM | POA: Diagnosis not present

## 2021-07-22 DIAGNOSIS — Z0000 Encounter for general adult medical examination without abnormal findings: Secondary | ICD-10-CM | POA: Diagnosis not present

## 2021-07-22 DIAGNOSIS — G4733 Obstructive sleep apnea (adult) (pediatric): Secondary | ICD-10-CM | POA: Diagnosis not present

## 2021-07-22 DIAGNOSIS — I509 Heart failure, unspecified: Secondary | ICD-10-CM | POA: Diagnosis not present

## 2021-07-22 DIAGNOSIS — D6869 Other thrombophilia: Secondary | ICD-10-CM | POA: Diagnosis not present

## 2021-07-22 DIAGNOSIS — I480 Paroxysmal atrial fibrillation: Secondary | ICD-10-CM | POA: Diagnosis not present

## 2021-07-22 DIAGNOSIS — I110 Hypertensive heart disease with heart failure: Secondary | ICD-10-CM | POA: Diagnosis not present

## 2021-07-23 ENCOUNTER — Other Ambulatory Visit: Payer: Self-pay | Admitting: Internal Medicine

## 2021-07-23 DIAGNOSIS — I5033 Acute on chronic diastolic (congestive) heart failure: Secondary | ICD-10-CM

## 2021-07-23 DIAGNOSIS — E785 Hyperlipidemia, unspecified: Secondary | ICD-10-CM

## 2021-07-23 DIAGNOSIS — I480 Paroxysmal atrial fibrillation: Secondary | ICD-10-CM

## 2021-07-23 NOTE — Telephone Encounter (Signed)
Prescription refill request for Eliquis received. ?Indication: Afib  ?Last office visit: 02/05/21 ([place]) ?Scr: 1.06 (02/05/21) ?Age: 81 ?Weight: 108kg ? ?Appropriate dose and refill sent to requested pharmacy.  ?

## 2021-07-24 DIAGNOSIS — H2513 Age-related nuclear cataract, bilateral: Secondary | ICD-10-CM | POA: Diagnosis not present

## 2021-07-28 ENCOUNTER — Ambulatory Visit (INDEPENDENT_AMBULATORY_CARE_PROVIDER_SITE_OTHER): Payer: Medicare Other

## 2021-07-28 DIAGNOSIS — I441 Atrioventricular block, second degree: Secondary | ICD-10-CM

## 2021-07-28 LAB — CUP PACEART REMOTE DEVICE CHECK
Date Time Interrogation Session: 20230510230741
Implantable Pulse Generator Implant Date: 20210810

## 2021-08-15 NOTE — Progress Notes (Signed)
Carelink Summary Report / Loop Recorder 

## 2021-09-01 ENCOUNTER — Ambulatory Visit (INDEPENDENT_AMBULATORY_CARE_PROVIDER_SITE_OTHER): Payer: Medicare Other

## 2021-09-01 DIAGNOSIS — I441 Atrioventricular block, second degree: Secondary | ICD-10-CM

## 2021-09-02 LAB — CUP PACEART REMOTE DEVICE CHECK
Date Time Interrogation Session: 20230612231617
Implantable Pulse Generator Implant Date: 20210810

## 2021-09-10 DIAGNOSIS — G4733 Obstructive sleep apnea (adult) (pediatric): Secondary | ICD-10-CM | POA: Diagnosis not present

## 2021-09-22 NOTE — Progress Notes (Signed)
Carelink Summary Report / Loop Recorder 

## 2021-10-05 LAB — CUP PACEART REMOTE DEVICE CHECK
Date Time Interrogation Session: 20230715231633
Implantable Pulse Generator Implant Date: 20210810

## 2021-10-06 ENCOUNTER — Ambulatory Visit (INDEPENDENT_AMBULATORY_CARE_PROVIDER_SITE_OTHER): Payer: Medicare Other

## 2021-10-06 DIAGNOSIS — I4819 Other persistent atrial fibrillation: Secondary | ICD-10-CM | POA: Diagnosis not present

## 2021-10-11 ENCOUNTER — Other Ambulatory Visit: Payer: Self-pay | Admitting: Student

## 2021-10-11 DIAGNOSIS — E7849 Other hyperlipidemia: Secondary | ICD-10-CM

## 2021-10-11 DIAGNOSIS — I5032 Chronic diastolic (congestive) heart failure: Secondary | ICD-10-CM

## 2021-11-06 NOTE — Progress Notes (Signed)
Carelink Summary Report / Loop Recorder 

## 2021-11-09 LAB — CUP PACEART REMOTE DEVICE CHECK
Date Time Interrogation Session: 20230817232106
Implantable Pulse Generator Implant Date: 20210810

## 2021-11-10 ENCOUNTER — Ambulatory Visit (INDEPENDENT_AMBULATORY_CARE_PROVIDER_SITE_OTHER): Payer: Medicare Other

## 2021-11-10 DIAGNOSIS — I441 Atrioventricular block, second degree: Secondary | ICD-10-CM

## 2021-12-03 NOTE — Progress Notes (Signed)
Carelink Summary Report / Loop Recorder 

## 2021-12-15 ENCOUNTER — Ambulatory Visit (INDEPENDENT_AMBULATORY_CARE_PROVIDER_SITE_OTHER): Payer: Medicare Other

## 2021-12-15 DIAGNOSIS — I4819 Other persistent atrial fibrillation: Secondary | ICD-10-CM | POA: Diagnosis not present

## 2021-12-16 LAB — CUP PACEART REMOTE DEVICE CHECK
Date Time Interrogation Session: 20231001231513
Implantable Pulse Generator Implant Date: 20210810

## 2021-12-29 NOTE — Progress Notes (Signed)
Carelink Summary Report / Loop Recorder 

## 2022-01-08 ENCOUNTER — Other Ambulatory Visit: Payer: Self-pay | Admitting: Internal Medicine

## 2022-01-08 DIAGNOSIS — I480 Paroxysmal atrial fibrillation: Secondary | ICD-10-CM

## 2022-01-08 DIAGNOSIS — E785 Hyperlipidemia, unspecified: Secondary | ICD-10-CM

## 2022-01-08 DIAGNOSIS — I5033 Acute on chronic diastolic (congestive) heart failure: Secondary | ICD-10-CM

## 2022-01-08 NOTE — Telephone Encounter (Signed)
Eliquis '5mg'$  refill request received. Patient is 81 years old, weight-108kg, Crea-1.06 on 02/05/2021, Diagnosis-Afib, and last seen by Dr. Rayann Heman on 02/05/2021 and pending appt on 02/02/2022 with Jonni Sanger. Dose is appropriate based on dosing criteria. Will send in refill to requested pharmacy.

## 2022-01-12 ENCOUNTER — Other Ambulatory Visit: Payer: Self-pay | Admitting: Student

## 2022-01-12 DIAGNOSIS — I480 Paroxysmal atrial fibrillation: Secondary | ICD-10-CM

## 2022-01-12 DIAGNOSIS — E785 Hyperlipidemia, unspecified: Secondary | ICD-10-CM

## 2022-01-12 DIAGNOSIS — I5033 Acute on chronic diastolic (congestive) heart failure: Secondary | ICD-10-CM

## 2022-01-13 NOTE — Telephone Encounter (Addendum)
Prescription refill request for Eliquis received. Indication: AF Last office visit: 02/05/21 Clearnce Hasten MD  (Has an appt 02/02/22) Scr: 1.06 on 02/05/21 Age: 81 Weight: 108kg  Based on above findings Eliquis '5mg'$  twice daily is the appropriate dose.  Refill approved.

## 2022-01-19 ENCOUNTER — Ambulatory Visit (INDEPENDENT_AMBULATORY_CARE_PROVIDER_SITE_OTHER): Payer: Medicare Other

## 2022-01-19 DIAGNOSIS — I4819 Other persistent atrial fibrillation: Secondary | ICD-10-CM

## 2022-01-20 LAB — CUP PACEART REMOTE DEVICE CHECK
Date Time Interrogation Session: 20231103231700
Implantable Pulse Generator Implant Date: 20210810

## 2022-01-28 NOTE — Progress Notes (Signed)
Electrophysiology Office Note Date: 02/02/2022  ID:  Austin Taylor, DOB [birth year], MRN [id number]  PCP: Maury Dus, MD Primary Cardiologist: Freada Bergeron, MD Electrophysiologist: Dr. Rayann Heman ->  Dr. Curt Bears  CC: ILR follow-up  Austin Taylor is a 81 y.o. male seen today for Dr. Curt Bears . he presents today for routine electrophysiology followup. Since last being seen in our clinic the patient reports doing well.  He has a known inguinal hernia. Has some SOB with strenuous activity. He describes a time recently where he was picking up limbs (large and small) and become SOB, rested for a short while, then was able to continue working in the yard.     he denies chest pain, palpitations, dyspnea, PND, orthopnea, nausea, vomiting, dizziness, syncope, edema, weight gain, or early satiety.   He takes eliquis BID, denies bleeding concerns.   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   "small one"; denies residual on 08/25/2016   Past Surgical History:  Procedure Laterality Date   ATRIAL FIBRILLATION ABLATION  08/25/2016   ATRIAL FIBRILLATION ABLATION N/A 08/25/2016   Procedure: Atrial Fibrillation Ablation;  Surgeon: [name], MD;  Location: [place];  Service: Cardiovascular;  Laterality: N/A;   CARDIOVERSION N/A 09/09/2015   Procedure: CARDIOVERSION;  Surgeon: Fay Records, MD;  Location: [place];  Service: Cardiovascular;  Laterality: N/A;   CARDIOVERSION N/A 11/20/2015   Procedure: CARDIOVERSION;  Surgeon: Pixie Casino, MD;  Location: [facility];  Service: Cardiovascular;  Laterality:  N/A;   implantable loop recorder placement  10/24/2019   Medtronic Reveal [place] model [id number] (SN # [id number] ) implantable loop recorder implanted.  old device (RRT) removed   LOOP RECORDER INSERTION N/A 04/21/2016   Procedure: Loop Recorder Insertion;  Surgeon: [name], MD;  Location: [facility];  Service: Cardiovascular;  Laterality: N/A;   MOHS SURGERY     ear   SHOULDER OPEN ROTATOR CUFF REPAIR Left 1998   TEE WITHOUT CARDIOVERSION N/A 08/25/2016   Procedure: TRANSESOPHAGEAL ECHOCARDIOGRAM (TEE);  Surgeon: Jerline Pain, MD;  Location: [facility];  Service: Cardiovascular;  Laterality: N/A;    Current Outpatient Medications  Medication Sig Dispense Refill   apixaban (ELIQUIS) 5 MG TABS tablet TAKE ONE TABLET BY MOUTH TWICE DAILY 60 tablet 5   doxycycline (VIBRAMYCIN) 100 MG capsule Take 100 mg by mouth 2 (two) times daily.     ferrous sulfate 325 (65 FE) MG tablet Take 325 mg by mouth every Monday, Wednesday, and Friday.      furosemide (LASIX) 40 MG tablet TAKE ONE TABLET BY MOUTH ONCE DAILY 90 tablet 1   Probiotic Product (PROBIOTIC PO) Take 1 tablet by mouth daily.     sildenafil (VIAGRA) 100 MG tablet 50 mg as needed.     simvastatin (ZOCOR) 40 MG tablet TAKE 1 TABLET BY MOUTH ONCE DAILY 30 tablet 10   No current facility-administered medications for this visit.    Allergies:   Codeine   Social History: Social History   Socioeconomic History   Marital  status: Married    Spouse name: Not on file   Number of children: 2   Years of education: 14   Highest education level: Not on file  Occupational History   Occupation: Chemical engineer: OTHER    Comment: Retired  Tobacco Use   Smoking status: Former    Packs/day: 1.00    Years: 17.00    Total pack years: 17.00    Types: Cigarettes    Quit date: 08/02/1974    Years since quitting: 47.5   Smokeless tobacco: Former    Quit date: 1977  Vaping Use   Vaping Use: Never used  Substance and Sexual  Activity   Alcohol use: Yes    Alcohol/week: 2.0 standard drinks of alcohol    Types: 2 Cans of beer per week   Drug use: No   Sexual activity: Yes  Other Topics Concern   Not on file  Social History Narrative   Patient is married with 2 children.   Patient is right handed.   Patient has 14 yrs of education.   Patient does not drink caffeine.   Social Determinants of Health   Financial Resource Strain: Not on file  Food Insecurity: Not on file  Transportation Needs: Not on file  Physical Activity: Not on file  Stress: Not on file  Social Connections: Not on file  Intimate Partner Violence: Not on file    Family History: Family History  Problem Relation Age of Onset   Heart disease Mother    Heart disease Father    Other Father        Carotid Artery Stenosis   Heart disease Brother      Review of Systems: All other systems reviewed and are otherwise negative except as noted above.  Physical Exam: Vitals:   02/02/22 0821  BP: 114/70  Pulse: (!) 53  SpO2: 96%  Weight: 238 lb (108 kg)  Height: '5\' 11"'$  (1.803 m)     GEN- The patient is well appearing, alert and oriented x 3 today.   HEENT: normocephalic, atraumatic; sclera clear, conjunctiva pink; hearing intact; oropharynx clear; neck supple  Lungs- Clear to ausculation bilaterally, normal work of breathing.  No wheezes, rales, rhonchi Heart- Regular rate and rhythm, no murmurs, rubs or gallops  GI- soft, non-tender, non-distended, bowel sounds present  Extremities- no clubbing, cyanosis, no edema  MS- no significant deformity or atrophy Skin- warm and dry, no rash or lesion; ILR pocket well healed Psych- euthymic mood, full affect Neuro- strength and sensation are intact  PPM Interrogation- reviewed in detail today,  See PACEART report  EKG:  EKG is ordered today. The ekg ordered today shows irregular sinus rhythm at 53bpm, 1st degree block   Recent Labs: 02/05/2021: BUN 17; Creatinine, Ser 1.06;  Hemoglobin 14.4; NT-Pro BNP 195; Platelets 255; Potassium 3.8; Sodium 139   Wt Readings from Last 3 Encounters:  02/02/22 238 lb (108 kg)  02/05/21 238 lb (108 kg)  11/06/20 233 lb 9.6 oz (106 kg)     Other studies Reviewed: Additional studies/ records that were reviewed today include: Previous EP office notes, Previous remote checks, Most recent labwork.   Assessment and Plan:  1. Atrial fibrillation s/p Medtronic Loop recorder Normal device function Battery life good Longest AF episode ~2h, though P-waves appear on EGM, likely not AF 4.9% burden CHA2DS2-VASc Score = 4  (stroke, age)  AC - eliquis '5mg'$  BID, dose appropriate. PCP orders labwork. No changes today   2.  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 OSA  Encouraged nightly CPAP   4. HLD Continue statin   Current medicines are reviewed at length with the patient today.   The patient does not have concerns regarding his medicines.  The following changes were made today:  none  Labs/ tests ordered today include: none Orders Placed This Encounter  Procedures   EKG 12-Lead     Disposition:   Follow up with Dr. Curt Bears  in 70yror sooner if needed     Signed, SMamie Levers NP  02/02/2022 9:06 AM  [id number] [phone]) [phone])

## 2022-01-29 DIAGNOSIS — H52223 Regular astigmatism, bilateral: Secondary | ICD-10-CM | POA: Diagnosis not present

## 2022-01-29 DIAGNOSIS — H5203 Hypermetropia, bilateral: Secondary | ICD-10-CM | POA: Diagnosis not present

## 2022-01-29 DIAGNOSIS — H53143 Visual discomfort, bilateral: Secondary | ICD-10-CM | POA: Diagnosis not present

## 2022-02-02 ENCOUNTER — Encounter: Payer: Self-pay | Admitting: Student

## 2022-02-02 ENCOUNTER — Ambulatory Visit: Payer: Medicare Other | Attending: Student | Admitting: Cardiology

## 2022-02-02 VITALS — BP 114/70 | HR 53 | Ht 71.0 in | Wt 238.0 lb

## 2022-02-02 DIAGNOSIS — Z95818 Presence of other cardiac implants and grafts: Secondary | ICD-10-CM | POA: Diagnosis not present

## 2022-02-02 DIAGNOSIS — G4733 Obstructive sleep apnea (adult) (pediatric): Secondary | ICD-10-CM

## 2022-02-02 DIAGNOSIS — E785 Hyperlipidemia, unspecified: Secondary | ICD-10-CM | POA: Diagnosis not present

## 2022-02-02 DIAGNOSIS — I4819 Other persistent atrial fibrillation: Secondary | ICD-10-CM

## 2022-02-02 DIAGNOSIS — I441 Atrioventricular block, second degree: Secondary | ICD-10-CM | POA: Diagnosis not present

## 2022-02-02 NOTE — Patient Instructions (Signed)
Medication Instructions:  Your physician recommends that you continue on your current medications as directed. Please refer to the Current Medication list given to you today.  *If you need a refill on your cardiac medications before your next appointment, please call your pharmacy*   Lab Work: None If you have labs (blood work) drawn today and your tests are completely normal, you will receive your results only by: MyChart Message (if you have MyChart) OR A paper copy in the mail If you have any lab test that is abnormal or we need to change your treatment, we will call you to review the results.   Follow-Up: At [facility], you and your health needs are our priority.  As part of our continuing mission to provide you with exceptional heart care, we have created designated Provider Care Teams.  These Care Teams include your primary Cardiologist (physician) and Advanced Practice Providers (APPs -  Physician Assistants and Nurse Practitioners) who all work together to provide you with the care you need, when you need it.  Your next appointment:   1 year(s)  The format for your next appointment:   In Person  Provider:   Will Camnitz, MD     Important Information About Sugar       

## 2022-02-18 NOTE — Progress Notes (Signed)
Carelink Summary Report / Loop Recorder 

## 2022-02-19 LAB — CUP PACEART REMOTE DEVICE CHECK
Date Time Interrogation Session: 20231206222249
Implantable Pulse Generator Implant Date: 20210810

## 2022-02-23 ENCOUNTER — Ambulatory Visit (INDEPENDENT_AMBULATORY_CARE_PROVIDER_SITE_OTHER): Payer: Medicare Other

## 2022-02-23 DIAGNOSIS — I4819 Other persistent atrial fibrillation: Secondary | ICD-10-CM

## 2022-03-16 HISTORY — PX: EYE SURGERY: SHX253

## 2022-03-25 LAB — CUP PACEART REMOTE DEVICE CHECK
Date Time Interrogation Session: 20240108222509
Implantable Pulse Generator Implant Date: 20210810

## 2022-03-30 ENCOUNTER — Ambulatory Visit (INDEPENDENT_AMBULATORY_CARE_PROVIDER_SITE_OTHER): Payer: Medicare Other

## 2022-03-30 DIAGNOSIS — I4819 Other persistent atrial fibrillation: Secondary | ICD-10-CM | POA: Diagnosis not present

## 2022-03-30 NOTE — Progress Notes (Signed)
Carelink Summary Report / Loop Recorder

## 2022-04-06 ENCOUNTER — Other Ambulatory Visit: Payer: Self-pay | Admitting: Internal Medicine

## 2022-04-06 ENCOUNTER — Other Ambulatory Visit: Payer: Self-pay

## 2022-04-06 DIAGNOSIS — E7849 Other hyperlipidemia: Secondary | ICD-10-CM

## 2022-04-06 DIAGNOSIS — I5032 Chronic diastolic (congestive) heart failure: Secondary | ICD-10-CM

## 2022-04-08 ENCOUNTER — Other Ambulatory Visit: Payer: Self-pay | Admitting: Internal Medicine

## 2022-04-08 DIAGNOSIS — I5032 Chronic diastolic (congestive) heart failure: Secondary | ICD-10-CM

## 2022-04-08 DIAGNOSIS — E7849 Other hyperlipidemia: Secondary | ICD-10-CM

## 2022-04-24 ENCOUNTER — Telehealth: Payer: Self-pay | Admitting: *Deleted

## 2022-04-24 DIAGNOSIS — H2513 Age-related nuclear cataract, bilateral: Secondary | ICD-10-CM | POA: Diagnosis not present

## 2022-04-24 DIAGNOSIS — H18413 Arcus senilis, bilateral: Secondary | ICD-10-CM | POA: Diagnosis not present

## 2022-04-24 DIAGNOSIS — H25043 Posterior subcapsular polar age-related cataract, bilateral: Secondary | ICD-10-CM | POA: Diagnosis not present

## 2022-04-24 DIAGNOSIS — H25013 Cortical age-related cataract, bilateral: Secondary | ICD-10-CM | POA: Diagnosis not present

## 2022-04-24 DIAGNOSIS — H2511 Age-related nuclear cataract, right eye: Secondary | ICD-10-CM | POA: Diagnosis not present

## 2022-04-24 NOTE — Telephone Encounter (Signed)
   Pre-operative Risk Assessment    Patient Name: Austin Taylor  DOB: 1940-10-04 MRN: [id number]      Request for Surgical Clearance    Procedure:   CATARACT EXTRACTION W/INTRAOCULAR LENS IMPLANT OF THE RIGHT EYE TO BE DONE ON 07/08/22; THIS WILL BE FOLLOWED BY THE LEFT EYE TO BE DONE ON 07/22/22  Date of Surgery:  Clearance 07/08/22                                 Surgeon:  DR. LISA SUN/DR. TIMOTHY BEVIS Surgeon's Group or Practice Name:  [facility] Phone number:  [phone]; ATT[place] [place] Fax number:  [phone]   Type of Clearance Requested:   - Medical ; PER CLEARANCE REQUEST NO MEDICATIONS ARE NEEDING TO BE HELD   Type of Anesthesia:   TOPICAL ANESTHESIA WITH IV MEDICATION   Additional requests/questions:    Jiles Prows   04/24/2022, 3:53 PM

## 2022-04-24 NOTE — Telephone Encounter (Signed)
   Patient Name: Austin Taylor  DOB: [birth year] MRN: [id number]  Primary Cardiologist: Freada Bergeron, MD  Chart reviewed as part of pre-operative protocol coverage. Cataract extractions are recognized in guidelines as low risk surgeries that do not typically require specific preoperative testing or holding of blood thinner therapy. Therefore, given past medical history and time since last visit, based on ACC/AHA guidelines, Austin Taylor would be at acceptable risk for the planned procedure without further cardiovascular testing.   I will route this recommendation to the requesting party via Epic fax function and remove from pre-op pool.  Please call with questions.  Mable Fill, Marissa Nestle, NP 04/24/2022, 3:59 PM

## 2022-04-30 LAB — CUP PACEART REMOTE DEVICE CHECK
Date Time Interrogation Session: 20240210222759
Implantable Pulse Generator Implant Date: 20210810

## 2022-05-04 ENCOUNTER — Ambulatory Visit: Payer: Medicare Other

## 2022-05-04 DIAGNOSIS — I4819 Other persistent atrial fibrillation: Secondary | ICD-10-CM | POA: Diagnosis not present

## 2022-05-11 NOTE — Progress Notes (Signed)
Carelink Summary Report / Loop Recorder 

## 2022-05-29 ENCOUNTER — Ambulatory Visit: Payer: Medicare Other

## 2022-05-29 DIAGNOSIS — I441 Atrioventricular block, second degree: Secondary | ICD-10-CM

## 2022-05-29 LAB — CUP PACEART REMOTE DEVICE CHECK
Date Time Interrogation Session: 20240314233222
Implantable Pulse Generator Implant Date: 20210810

## 2022-06-08 ENCOUNTER — Ambulatory Visit: Payer: Medicare Other

## 2022-06-15 NOTE — Progress Notes (Signed)
Carelink Summary Report / Loop Recorder 

## 2022-06-25 DIAGNOSIS — D0421 Carcinoma in situ of skin of right ear and external auricular canal: Secondary | ICD-10-CM | POA: Diagnosis not present

## 2022-06-25 DIAGNOSIS — L570 Actinic keratosis: Secondary | ICD-10-CM | POA: Diagnosis not present

## 2022-06-25 DIAGNOSIS — D1801 Hemangioma of skin and subcutaneous tissue: Secondary | ICD-10-CM | POA: Diagnosis not present

## 2022-06-25 DIAGNOSIS — D2261 Melanocytic nevi of right upper limb, including shoulder: Secondary | ICD-10-CM | POA: Diagnosis not present

## 2022-06-25 DIAGNOSIS — L821 Other seborrheic keratosis: Secondary | ICD-10-CM | POA: Diagnosis not present

## 2022-06-25 DIAGNOSIS — D225 Melanocytic nevi of trunk: Secondary | ICD-10-CM | POA: Diagnosis not present

## 2022-06-25 DIAGNOSIS — L738 Other specified follicular disorders: Secondary | ICD-10-CM | POA: Diagnosis not present

## 2022-06-25 DIAGNOSIS — L814 Other melanin hyperpigmentation: Secondary | ICD-10-CM | POA: Diagnosis not present

## 2022-06-25 DIAGNOSIS — Z85828 Personal history of other malignant neoplasm of skin: Secondary | ICD-10-CM | POA: Diagnosis not present

## 2022-06-29 DIAGNOSIS — D6869 Other thrombophilia: Secondary | ICD-10-CM | POA: Diagnosis not present

## 2022-06-29 DIAGNOSIS — I509 Heart failure, unspecified: Secondary | ICD-10-CM | POA: Diagnosis not present

## 2022-06-29 DIAGNOSIS — M79605 Pain in left leg: Secondary | ICD-10-CM | POA: Diagnosis not present

## 2022-06-29 NOTE — Progress Notes (Signed)
Carelink Summary Report / Loop Recorder 

## 2022-07-01 ENCOUNTER — Ambulatory Visit (INDEPENDENT_AMBULATORY_CARE_PROVIDER_SITE_OTHER): Payer: Medicare Other

## 2022-07-01 DIAGNOSIS — I441 Atrioventricular block, second degree: Secondary | ICD-10-CM | POA: Diagnosis not present

## 2022-07-01 LAB — CUP PACEART REMOTE DEVICE CHECK
Date Time Interrogation Session: 20240416233318
Implantable Pulse Generator Implant Date: 20210810

## 2022-07-06 ENCOUNTER — Other Ambulatory Visit: Payer: Self-pay

## 2022-07-06 DIAGNOSIS — E785 Hyperlipidemia, unspecified: Secondary | ICD-10-CM

## 2022-07-06 DIAGNOSIS — I480 Paroxysmal atrial fibrillation: Secondary | ICD-10-CM

## 2022-07-06 DIAGNOSIS — I5033 Acute on chronic diastolic (congestive) heart failure: Secondary | ICD-10-CM

## 2022-07-06 MED ORDER — APIXABAN 5 MG PO TABS: 5.0000 mg | ORAL_TABLET | Freq: Two times a day (BID) | ORAL | 1 refills | Status: DC

## 2022-07-06 NOTE — Telephone Encounter (Signed)
Faxed Eliquis refill request received.  Pt last saw Austin Don, NP on 02/02/22, last labs 07/22/21 Creat 1.0 at [facility] per [facility], age 82, weight 108kg, based on specified criteria pt is on appropriate dosage of Eliquis  BID for afib.  Will refill rx.

## 2022-07-08 DIAGNOSIS — H2511 Age-related nuclear cataract, right eye: Secondary | ICD-10-CM | POA: Diagnosis not present

## 2022-07-08 DIAGNOSIS — Z961 Presence of intraocular lens: Secondary | ICD-10-CM | POA: Diagnosis not present

## 2022-07-09 DIAGNOSIS — H2512 Age-related nuclear cataract, left eye: Secondary | ICD-10-CM | POA: Diagnosis not present

## 2022-07-13 ENCOUNTER — Ambulatory Visit: Payer: Medicare Other

## 2022-07-15 DIAGNOSIS — H2511 Age-related nuclear cataract, right eye: Secondary | ICD-10-CM | POA: Diagnosis not present

## 2022-07-15 DIAGNOSIS — Z961 Presence of intraocular lens: Secondary | ICD-10-CM | POA: Diagnosis not present

## 2022-07-22 DIAGNOSIS — H2512 Age-related nuclear cataract, left eye: Secondary | ICD-10-CM | POA: Diagnosis not present

## 2022-07-29 DIAGNOSIS — H52221 Regular astigmatism, right eye: Secondary | ICD-10-CM | POA: Diagnosis not present

## 2022-07-29 DIAGNOSIS — H2512 Age-related nuclear cataract, left eye: Secondary | ICD-10-CM | POA: Diagnosis not present

## 2022-08-03 ENCOUNTER — Ambulatory Visit (INDEPENDENT_AMBULATORY_CARE_PROVIDER_SITE_OTHER): Payer: Medicare Other

## 2022-08-03 DIAGNOSIS — I4819 Other persistent atrial fibrillation: Secondary | ICD-10-CM | POA: Diagnosis not present

## 2022-08-03 LAB — CUP PACEART REMOTE DEVICE CHECK
Date Time Interrogation Session: 20240519233325
Implantable Pulse Generator Implant Date: 20210810

## 2022-08-03 NOTE — Progress Notes (Signed)
Carelink Summary Report / Loop Recorder 

## 2022-08-06 DIAGNOSIS — K219 Gastro-esophageal reflux disease without esophagitis: Secondary | ICD-10-CM | POA: Diagnosis not present

## 2022-08-06 DIAGNOSIS — G4733 Obstructive sleep apnea (adult) (pediatric): Secondary | ICD-10-CM | POA: Diagnosis not present

## 2022-08-06 DIAGNOSIS — D509 Iron deficiency anemia, unspecified: Secondary | ICD-10-CM | POA: Diagnosis not present

## 2022-08-06 DIAGNOSIS — I480 Paroxysmal atrial fibrillation: Secondary | ICD-10-CM | POA: Diagnosis not present

## 2022-08-06 DIAGNOSIS — I110 Hypertensive heart disease with heart failure: Secondary | ICD-10-CM | POA: Diagnosis not present

## 2022-08-06 DIAGNOSIS — E7800 Pure hypercholesterolemia, unspecified: Secondary | ICD-10-CM | POA: Diagnosis not present

## 2022-08-06 DIAGNOSIS — Z0000 Encounter for general adult medical examination without abnormal findings: Secondary | ICD-10-CM | POA: Diagnosis not present

## 2022-08-06 DIAGNOSIS — D6869 Other thrombophilia: Secondary | ICD-10-CM | POA: Diagnosis not present

## 2022-08-06 DIAGNOSIS — I5030 Unspecified diastolic (congestive) heart failure: Secondary | ICD-10-CM | POA: Diagnosis not present

## 2022-08-28 NOTE — Progress Notes (Signed)
Carelink Summary Report / Loop Recorder 

## 2022-09-07 ENCOUNTER — Ambulatory Visit (INDEPENDENT_AMBULATORY_CARE_PROVIDER_SITE_OTHER): Payer: Medicare Other

## 2022-09-07 DIAGNOSIS — I5032 Chronic diastolic (congestive) heart failure: Secondary | ICD-10-CM

## 2022-09-07 DIAGNOSIS — I4819 Other persistent atrial fibrillation: Secondary | ICD-10-CM | POA: Diagnosis not present

## 2022-09-08 LAB — CUP PACEART REMOTE DEVICE CHECK
Date Time Interrogation Session: 20240623231238
Implantable Pulse Generator Implant Date: 20210810

## 2022-09-25 NOTE — Progress Notes (Signed)
Carelink Summary Report / Loop Recorder 

## 2022-10-08 DIAGNOSIS — G4733 Obstructive sleep apnea (adult) (pediatric): Secondary | ICD-10-CM | POA: Diagnosis not present

## 2022-10-12 ENCOUNTER — Ambulatory Visit: Payer: Medicare Other

## 2022-10-12 DIAGNOSIS — I4819 Other persistent atrial fibrillation: Secondary | ICD-10-CM

## 2022-10-13 DIAGNOSIS — G4733 Obstructive sleep apnea (adult) (pediatric): Secondary | ICD-10-CM | POA: Diagnosis not present

## 2022-10-28 NOTE — Progress Notes (Signed)
Carelink Summary Report / Loop Recorder 

## 2022-11-09 ENCOUNTER — Encounter (HOSPITAL_COMMUNITY): Payer: Self-pay

## 2022-11-09 ENCOUNTER — Observation Stay (HOSPITAL_COMMUNITY)
Admission: EM | Admit: 2022-11-09 | Discharge: 2022-11-10 | Disposition: A | Discharge status: Home / Self Care | Payer: Medicare Other | Attending: Internal Medicine | Admitting: Internal Medicine

## 2022-11-09 ENCOUNTER — Emergency Department (HOSPITAL_COMMUNITY): Payer: Medicare Other

## 2022-11-09 ENCOUNTER — Other Ambulatory Visit: Payer: Self-pay

## 2022-11-09 DIAGNOSIS — I4891 Unspecified atrial fibrillation: Secondary | ICD-10-CM | POA: Diagnosis not present

## 2022-11-09 DIAGNOSIS — G459 Transient cerebral ischemic attack, unspecified: Secondary | ICD-10-CM | POA: Diagnosis not present

## 2022-11-09 DIAGNOSIS — Z8673 Personal history of transient ischemic attack (TIA), and cerebral infarction without residual deficits: Secondary | ICD-10-CM | POA: Diagnosis not present

## 2022-11-09 DIAGNOSIS — Z79899 Other long term (current) drug therapy: Secondary | ICD-10-CM | POA: Diagnosis not present

## 2022-11-09 DIAGNOSIS — Z7901 Long term (current) use of anticoagulants: Secondary | ICD-10-CM | POA: Diagnosis not present

## 2022-11-09 DIAGNOSIS — K449 Diaphragmatic hernia without obstruction or gangrene: Secondary | ICD-10-CM | POA: Diagnosis not present

## 2022-11-09 DIAGNOSIS — I5032 Chronic diastolic (congestive) heart failure: Secondary | ICD-10-CM | POA: Diagnosis not present

## 2022-11-09 DIAGNOSIS — N182 Chronic kidney disease, stage 2 (mild): Secondary | ICD-10-CM | POA: Diagnosis not present

## 2022-11-09 DIAGNOSIS — R55 Syncope and collapse: Secondary | ICD-10-CM | POA: Diagnosis not present

## 2022-11-09 DIAGNOSIS — Z1152 Encounter for screening for COVID-19: Secondary | ICD-10-CM | POA: Diagnosis not present

## 2022-11-09 DIAGNOSIS — R001 Bradycardia, unspecified: Secondary | ICD-10-CM | POA: Diagnosis present

## 2022-11-09 DIAGNOSIS — Z743 Need for continuous supervision: Secondary | ICD-10-CM | POA: Diagnosis not present

## 2022-11-09 DIAGNOSIS — I4819 Other persistent atrial fibrillation: Secondary | ICD-10-CM | POA: Diagnosis not present

## 2022-11-09 DIAGNOSIS — R404 Transient alteration of awareness: Secondary | ICD-10-CM | POA: Diagnosis not present

## 2022-11-09 DIAGNOSIS — Z950 Presence of cardiac pacemaker: Secondary | ICD-10-CM | POA: Diagnosis not present

## 2022-11-09 DIAGNOSIS — R110 Nausea: Secondary | ICD-10-CM | POA: Diagnosis not present

## 2022-11-09 DIAGNOSIS — Z85828 Personal history of other malignant neoplasm of skin: Secondary | ICD-10-CM | POA: Insufficient documentation

## 2022-11-09 DIAGNOSIS — I6782 Cerebral ischemia: Secondary | ICD-10-CM | POA: Diagnosis not present

## 2022-11-09 DIAGNOSIS — I499 Cardiac arrhythmia, unspecified: Secondary | ICD-10-CM | POA: Diagnosis not present

## 2022-11-09 DIAGNOSIS — Z87891 Personal history of nicotine dependence: Secondary | ICD-10-CM | POA: Diagnosis not present

## 2022-11-09 DIAGNOSIS — R6889 Other general symptoms and signs: Secondary | ICD-10-CM | POA: Diagnosis not present

## 2022-11-09 LAB — COMPREHENSIVE METABOLIC PANEL
ALT: 16 U/L (ref 0–44)
AST: 20 U/L (ref 15–41)
Albumin: 3.3 g/dL — ABNORMAL LOW (ref 3.5–5.0)
Alkaline Phosphatase: 49 U/L (ref 38–126)
Anion gap: 7 (ref 5–15)
BUN: 19 mg/dL (ref 8–23)
CO2: 24 mmol/L (ref 22–32)
Calcium: 8.1 mg/dL — ABNORMAL LOW (ref 8.9–10.3)
Chloride: 110 mmol/L (ref 98–111)
Creatinine, Ser: 1.04 mg/dL (ref 0.61–1.24)
GFR, Estimated: >60 mL/min (ref >60)
Glucose, Bld: 111 mg/dL — ABNORMAL HIGH (ref 70–99)
Potassium: 3.9 mmol/L (ref 3.5–5.1)
Sodium: 141 mmol/L (ref 135–145)
Total Bilirubin: 0.4 mg/dL (ref 0.3–1.2)
Total Protein: 6.3 g/dL — ABNORMAL LOW (ref 6.5–8.1)

## 2022-11-09 LAB — CBC
HCT: 41.2 % (ref 39.0–52.0)
Hemoglobin: 13.5 g/dL (ref 13.0–17.0)
MCH: 30.4 pg (ref 26.0–34.0)
MCHC: 32.8 g/dL (ref 30.0–36.0)
MCV: 92.8 fL (ref 80.0–100.0)
Platelets: 204 10*3/uL (ref 150–400)
RBC: 4.44 MIL/uL (ref 4.22–5.81)
RDW: 13 % (ref 11.5–15.5)
WBC: 8 10*3/uL (ref 4.0–10.5)
nRBC: 0 % (ref 0.0–0.2)

## 2022-11-09 LAB — TSH: TSH: 3.645 u[IU]/mL (ref 0.350–4.500)

## 2022-11-09 LAB — TROPONIN I (HIGH SENSITIVITY)
Troponin I (High Sensitivity): 5 ng/L (ref <18)
Troponin I (High Sensitivity): 6 ng/L (ref <18)

## 2022-11-09 LAB — MAGNESIUM: Magnesium: 2.1 mg/dL (ref 1.7–2.4)

## 2022-11-09 LAB — SARS CORONAVIRUS 2 BY RT PCR: SARS Coronavirus 2 by RT PCR: NEGATIVE

## 2022-11-09 NOTE — ED Provider Notes (Signed)
Plan to admit patient for syncope - awaiting loop recorder results    Zadie Rhine, MD 11/09/22 2341

## 2022-11-09 NOTE — ED Provider Notes (Signed)
D/w dr Hardin Negus with hospitalist - will admit for near syncope/bradycardia I have reviewed EKG - sinus bradycardia, no high grade AV block   Zadie Rhine, MD 11/09/22 2358

## 2022-11-09 NOTE — ED Notes (Signed)
Patient transported to MRI 

## 2022-11-09 NOTE — ED Triage Notes (Signed)
Patient BIB [facility] from home for nausea and dizziness, on EMS arrival patient found to be pale, diaphoretic with a HR of 45 in afib. EMS administered 1L NS, 4mg  zofran en route with improvement in HR to 50-55. Patient is A&Ox4, diaphoresis and color improved with the fluids.

## 2022-11-09 NOTE — ED Provider Notes (Signed)
[place] EMERGENCY DEPARTMENT AT [facility] Provider Note   CSN: [id number] Arrival date & time: 11/09/22  1848     History {Add pertinent medical, surgical, social history, OB history to HPI:1} Chief Complaint  Patient presents with   Bradycardia    Austin Taylor is a 82 y.o. male w/ hx of persistent A Fib on eliquis, HLD, presenting to ED with concern for lightheadedness, nausea.  Pt reports abrupt onset of these symptoms this afternoon, feeling dizzy and lightheaded, short of breath.  Denies CP.  EMS reports pt's HR in "40's" in route to hospital and he was given 1 liter IV fluid bolus.  HPI     Home Medications Prior to Admission medications   Medication Sig Start Date End Date Taking? Authorizing Provider  apixaban (ELIQUIS) 5 MG TABS tablet Take 1 tablet (5 mg total) by mouth 2 (two) times daily. 07/06/22   Meriam Sprague, MD  doxycycline (VIBRAMYCIN) 100 MG capsule Take 100 mg by mouth 2 (two) times daily. 02/04/21   [provider]  ferrous sulfate 325 (65 FE) MG tablet Take 325 mg by mouth every Monday, Wednesday, and Friday.     [provider]  furosemide (LASIX) 40 MG tablet TAKE ONE TABLET BY MOUTH ONCE DAILY 04/08/22   Sherie Don, NP  Probiotic Product (PROBIOTIC PO) Take 1 tablet by mouth daily.    [provider]  sildenafil (VIAGRA) 100 MG tablet 50 mg as needed.    [provider]  simvastatin (ZOCOR) 40 MG tablet TAKE 1 TABLET BY MOUTH ONCE DAILY 11/13/15   Lars Masson, MD      Allergies    Codeine    Review of Systems   Review of Systems  Physical Exam Updated Vital Signs BP 128/65   Pulse 60   Temp (!) 97.3 F (36.3 C) (Temporal)   Resp 15   SpO2 99%  Physical Exam Constitutional:      General: He is not in acute distress. HENT:     Head: Normocephalic and atraumatic.  Eyes:     Conjunctiva/sclera: Conjunctivae normal.     Pupils: Pupils are equal, round, and reactive to light.   Cardiovascular:     Rate and Rhythm: Bradycardia present. Rhythm irregular.  Pulmonary:     Effort: Pulmonary effort is normal. No respiratory distress.  Abdominal:     General: There is no distension.     Tenderness: There is no abdominal tenderness.  Skin:    General: Skin is warm and dry.  Neurological:     General: No focal deficit present.     Mental Status: He is alert. Mental status is at baseline.  Psychiatric:        Mood and Affect: Mood normal.        Behavior: Behavior normal.     ED Results / Procedures / Treatments   Labs (all labs ordered are listed, but only abnormal results are displayed) Labs Reviewed  COMPREHENSIVE METABOLIC PANEL - Abnormal; Notable for the following components:      Result Value   Glucose, Bld 111 (*)    Calcium 8.1 (*)    Total Protein 6.3 (*)    Albumin 3.3 (*)    All other components within normal limits  SARS CORONAVIRUS 2 BY RT PCR  MAGNESIUM  CBC  TSH  URINALYSIS, ROUTINE W REFLEX MICROSCOPIC  TROPONIN I (HIGH SENSITIVITY)  TROPONIN I (HIGH SENSITIVITY)    EKG None  Radiology  CT Head Wo Contrast  Result Date: 11/09/2022 CLINICAL DATA:  Altered level of consciousness EXAM: CT HEAD WITHOUT CONTRAST TECHNIQUE: Contiguous axial images were obtained from the base of the skull through the vertex without intravenous contrast. RADIATION DOSE REDUCTION: This exam was performed according to the departmental dose-optimization program which includes automated exposure control, adjustment of the mA and/or kV according to patient size and/or use of iterative reconstruction technique. COMPARISON:  12/27/2013 FINDINGS: Brain: No acute infarct or hemorrhage. Chronic ischemic change within the right cerebellar hemisphere. Lateral ventricles and midline structures are unremarkable. No acute extra-axial fluid collections. No mass effect. Vascular: No hyperdense vessel or unexpected calcification. Skull: Normal. Negative for fracture or focal  lesion. Sinuses/Orbits: No acute finding. Other: None. IMPRESSION: 1. No acute intracranial process. Electronically Signed   By: Sharlet Salina M.D.   On: 11/09/2022 20:35   DG Chest 2 View  Result Date: 11/09/2022 CLINICAL DATA:  Near syncope.  Nausea. EXAM: CHEST - 2 VIEW COMPARISON:  Chest radiograph dated 10/07/2018. FINDINGS: No focal consolidation, pleural effusion, or pneumothorax. Stable cardiac silhouette. A loop recorder device is noted. Large hiatal hernia. No acute osseous pathology. IMPRESSION: 1. No active cardiopulmonary disease. 2. Large hiatal hernia. Electronically Signed   By: Elgie Collard M.D.   On: 11/09/2022 20:21    Procedures Procedures  {Document cardiac monitor, telemetry assessment procedure when appropriate:1}  Medications Ordered in ED Medications - No data to display  ED Course/ Medical Decision Making/ A&P Clinical Course as of 11/09/22 2316  Mon Nov 09, 2022  2223 Pt reassessed continues to feel somewhat lightheaded, otherwise no acute symptoms.  Blood pressure vital signs have otherwise remained stable.  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 [MT]  2232 Delta trop negative [MT]    Clinical Course User Index [MT] Jeb Schloemer, Kermit Balo, MD   {   Click here for ABCD2, HEART and other calculatorsREFRESH Note before signing :1}                              Medical Decision Making Amount and/or Complexity of Data Reviewed Labs: ordered. Radiology: ordered.   This patient presents to the ED with concern for lightheadedness, shortness of breath. This involves an extensive number of treatment options, and is a complaint that carries with it a high risk of complications and morbidity.  The differential diagnosis includes infection vs anemia vs viral illness vs atypical ACS vs  CVA vs other  Co-morbidities that complicate the patient evaluation: hx of HLD is CV risk factor  Additional history obtained from EMS  External records from outside source obtained and reviewed including cardiology office eval - hx of loop recorder (medtronic), persistent A Fib.  HR in cardiology office was 53 bpm on 02/02/22 and 56 bpm on 02/05/21, so he may have borderline bradycardia at baseline.  I also reviewed patient's prior MR imaging, including MRI of the brain performed in October 2015 which showed at that time acute infarct involving the right cerebellar hemisphere.  I ordered and personally interpreted labs.  The pertinent results include: No emergent findings  I ordered imaging studies including dg chest, ct head I independently visualized and interpreted imaging which showed no emergent findings I agree with the radiologist interpretation  The patient was maintained on a cardiac monitor.  I personally viewed and interpreted the cardiac monitored which showed an underlying rhythm of: a fib, rate controlled, HR 50-60 bpm  Per my interpretation the patient's ECG shows sinus mild bradycardia with prolonged PR that is rate controlled, no acute ischemic findings  I have reviewed the patients home medicines and have made adjustments as needed  Test Considered: Doubt acute PE.  No tachycardia, no hypoxia, patient is compliant withhEliquis   After the interventions noted above, I reevaluated the patient and found that they have: stayed the same   Dispostion:  After consideration of the diagnostic results and the patients response to treatment, I feel that the patent would benefit from ***.    {Document critical care time when appropriate:1} {Document review of labs and clinical decision tools ie heart score, Chads2Vasc2 etc:1}  {Document your independent review of radiology images, and any outside records:1} {Document your discussion with family members, caretakers, and  with consultants:1} {Document social determinants of health affecting pt's care:1} {Document your decision making why or why not admission, treatments were needed:1} Final Clinical Impression(s) / ED Diagnoses Final diagnoses:  None    Rx / DC Orders ED Discharge Orders     None

## 2022-11-09 NOTE — ED Notes (Signed)
Patient's loop recorder interrogated using Medtronic .

## 2022-11-10 ENCOUNTER — Encounter (HOSPITAL_COMMUNITY): Payer: Self-pay | Admitting: Family Medicine

## 2022-11-10 ENCOUNTER — Observation Stay (HOSPITAL_BASED_OUTPATIENT_CLINIC_OR_DEPARTMENT_OTHER): Payer: Medicare Other

## 2022-11-10 DIAGNOSIS — N182 Chronic kidney disease, stage 2 (mild): Secondary | ICD-10-CM | POA: Diagnosis not present

## 2022-11-10 DIAGNOSIS — R001 Bradycardia, unspecified: Secondary | ICD-10-CM | POA: Diagnosis not present

## 2022-11-10 DIAGNOSIS — Z79899 Other long term (current) drug therapy: Secondary | ICD-10-CM | POA: Diagnosis not present

## 2022-11-10 DIAGNOSIS — R55 Syncope and collapse: Secondary | ICD-10-CM

## 2022-11-10 DIAGNOSIS — R42 Dizziness and giddiness: Secondary | ICD-10-CM

## 2022-11-10 DIAGNOSIS — Z87891 Personal history of nicotine dependence: Secondary | ICD-10-CM | POA: Diagnosis not present

## 2022-11-10 DIAGNOSIS — I4819 Other persistent atrial fibrillation: Secondary | ICD-10-CM | POA: Diagnosis not present

## 2022-11-10 DIAGNOSIS — Z7901 Long term (current) use of anticoagulants: Secondary | ICD-10-CM | POA: Diagnosis not present

## 2022-11-10 DIAGNOSIS — Z950 Presence of cardiac pacemaker: Secondary | ICD-10-CM | POA: Diagnosis not present

## 2022-11-10 DIAGNOSIS — Z8673 Personal history of transient ischemic attack (TIA), and cerebral infarction without residual deficits: Secondary | ICD-10-CM | POA: Diagnosis not present

## 2022-11-10 DIAGNOSIS — Z85828 Personal history of other malignant neoplasm of skin: Secondary | ICD-10-CM | POA: Diagnosis not present

## 2022-11-10 DIAGNOSIS — I5032 Chronic diastolic (congestive) heart failure: Secondary | ICD-10-CM | POA: Diagnosis not present

## 2022-11-10 DIAGNOSIS — Z1152 Encounter for screening for COVID-19: Secondary | ICD-10-CM | POA: Diagnosis not present

## 2022-11-10 LAB — ECHOCARDIOGRAM COMPLETE
AR max vel: 2.36 cm2
AV Area VTI: 2.45 cm2
AV Area mean vel: 2.44 cm2
AV Mean grad: 5 mmHg
AV Peak grad: 9.2 mmHg
Ao pk vel: 1.52 m/s
Area-P 1/2: 3.4 cm2
Height: 71 in
S' Lateral: 2.3 cm
Weight: 3679.04 oz

## 2022-11-10 LAB — LIPID PANEL
Cholesterol: 132 mg/dL (ref 0–200)
HDL: 38 mg/dL — ABNORMAL LOW (ref >40)
LDL Cholesterol: 81 mg/dL (ref 0–99)
Total CHOL/HDL Ratio: 3.5 RATIO
Triglycerides: 64 mg/dL (ref <150)
VLDL: 13 mg/dL (ref 0–40)

## 2022-11-10 LAB — GLUCOSE, CAPILLARY: Glucose-Capillary: 105 mg/dL — ABNORMAL HIGH (ref 70–99)

## 2022-11-10 LAB — PHOSPHORUS: Phosphorus: 3.3 mg/dL (ref 2.5–4.6)

## 2022-11-10 LAB — TROPONIN I (HIGH SENSITIVITY)
Troponin I (High Sensitivity): 6 ng/L (ref <18)
Troponin I (High Sensitivity): 6 ng/L (ref <18)
Troponin I (High Sensitivity): 6 ng/L (ref <18)

## 2022-11-10 MED ORDER — SENNOSIDES-DOCUSATE SODIUM 8.6-50 MG PO TABS: 1.0000 | ORAL_TABLET | Freq: Every evening | ORAL | Status: DC | PRN

## 2022-11-10 MED ORDER — ACETAMINOPHEN 325 MG PO TABS: 650.0000 mg | ORAL_TABLET | Freq: Four times a day (QID) | ORAL | Status: DC | PRN

## 2022-11-10 MED ORDER — SODIUM CHLORIDE 0.9% FLUSH
3.0000 mL | Freq: Two times a day (BID) | INTRAVENOUS | Status: DC
  Administered 2022-11-10: 3 mL via INTRAVENOUS

## 2022-11-10 MED ORDER — ONDANSETRON HCL 4 MG PO TABS: 4.0000 mg | ORAL_TABLET | Freq: Four times a day (QID) | ORAL | Status: DC | PRN

## 2022-11-10 MED ORDER — BISACODYL 5 MG PO TBEC: 5.0000 mg | DELAYED_RELEASE_TABLET | Freq: Every day | ORAL | Status: DC | PRN

## 2022-11-10 MED ORDER — MAGNESIUM CITRATE PO SOLN: 1.0000 | Freq: Once | ORAL | Status: DC | PRN

## 2022-11-10 MED ORDER — ONDANSETRON HCL 4 MG/2ML IJ SOLN: 4.0000 mg | Freq: Four times a day (QID) | INTRAMUSCULAR | Status: DC | PRN

## 2022-11-10 MED ORDER — TRAZODONE HCL 50 MG PO TABS: 25.0000 mg | ORAL_TABLET | Freq: Every evening | ORAL | Status: DC | PRN

## 2022-11-10 MED ORDER — ACETAMINOPHEN 650 MG RE SUPP: 650.0000 mg | Freq: Four times a day (QID) | RECTAL | Status: DC | PRN

## 2022-11-10 MED ORDER — ALUM & MAG HYDROXIDE-SIMETH 200-200-20 MG/5ML PO SUSP: 30.0000 mL | Freq: Four times a day (QID) | ORAL | Status: DC | PRN

## 2022-11-10 MED ORDER — LACTATED RINGERS IV SOLN: INTRAVENOUS | Status: DC

## 2022-11-10 NOTE — ED Notes (Signed)
ED TO INPATIENT HANDOFF REPORT  ED Nurse Name and Phone #:  Les Pou RN [phone]  S Name/Age/Gender Austin Taylor 82 y.o. male Room/Bed: TRACC/TRACC  Code Status   Code Status: Full Code  Home/SNF/Other Home Patient oriented to: self, place, time, and situation Is this baseline? Yes   Triage Complete: Triage complete  Chief Complaint Near syncope [R55]  Triage Note Patient BIB [facility] from home for nausea and dizziness, on EMS arrival patient found to be pale, diaphoretic with a HR of 45 in afib. EMS administered 1L NS, 4mg  zofran en route with improvement in HR to 50-55. Patient is A&Ox4, diaphoresis and color improved with the fluids.    Allergies Allergies  Allergen Reactions   Codeine Nausea Only         Level of Care/Admitting Diagnosis ED Disposition     ED Disposition  Admit   Condition  --   Comment  Hospital Area: [facility] [[id number]]  Level of Care: Telemetry Cardiac [103]  May place patient in observation at [facility] or [facility] if equivalent level of care is available:: No  Covid Evaluation: Asymptomatic - no recent exposure (last 10 days) testing not required  Diagnosis: Near syncope [692301]  Admitting Physician: Tonye Royalty [[id number]]  Attending Physician: Tonye Royalty [[id number]]          B Medical/Surgery History 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   "small one"; denies residual on 08/25/2016   Past Surgical History:  Procedure Laterality Date   ATRIAL FIBRILLATION ABLATION  08/25/2016   ATRIAL FIBRILLATION ABLATION N/A 08/25/2016   Procedure: Atrial Fibrillation Ablation;  Surgeon: Hillis Range, MD;  Location: [facility] INVASIVE CV LAB;  Service:  Cardiovascular;  Laterality: N/A;   CARDIOVERSION N/A 09/09/2015   Procedure: CARDIOVERSION;  Surgeon: Pricilla Riffle, MD;  Location: [facility] ENDOSCOPY;  Service: Cardiovascular;  Laterality: N/A;   CARDIOVERSION N/A 11/20/2015   Procedure: CARDIOVERSION;  Surgeon: Chrystie Nose, MD;  Location: [facility] ENDOSCOPY;  Service: Cardiovascular;  Laterality: N/A;   implantable loop recorder placement  10/24/2019   Medtronic Reveal [place] model [id number] (SN # [id number] ) implantable loop recorder implanted.  old device (RRT) removed   LOOP RECORDER INSERTION N/A 04/21/2016   Procedure: Loop Recorder Insertion;  Surgeon: Hillis Range, MD;  Location: [facility] INVASIVE CV LAB;  Service: Cardiovascular;  Laterality: N/A;   MOHS SURGERY     ear   SHOULDER OPEN ROTATOR CUFF REPAIR Left 1998   TEE WITHOUT CARDIOVERSION N/A 08/25/2016   Procedure: TRANSESOPHAGEAL ECHOCARDIOGRAM (TEE);  Surgeon: Jake Bathe, MD;  Location: [facility] ENDOSCOPY;  Service: Cardiovascular;  Laterality: N/A;     A IV Location/Drains/Wounds Patient Lines/Drains/Airways Status     Active Line/Drains/Airways     Name Placement date Placement time Site Days   Peripheral IV 11/09/22 18 G Left Antecubital 11/09/22  1855  Antecubital  1            Intake/Output Last 24 hours  Intake/Output Summary (Last 24 hours) at 11/10/2022 0138 Last data filed at 11/09/2022 1855 Gross per 24 hour  Intake 1008 ml  Output --  Net 1008  ml    Labs/Imaging Results for orders placed or performed during the hospital encounter of 11/09/22 (from the past 48 hour(s))  SARS Coronavirus 2 by RT PCR (hospital order, performed in [facility] hospital lab) *cepheid single result test* Anterior Nasal Swab     Status: None   Collection Time: 11/09/22  7:08 PM   Specimen: Anterior Nasal Swab  Result Value Ref Range   SARS Coronavirus 2 by RT PCR NEGATIVE NEGATIVE    Comment: Performed at [facility], [address] [place], [place] [address]  Magnesium     Status:  None   Collection Time: 11/09/22  7:10 PM  Result Value Ref Range   Magnesium 2.1 1.7 - 2.4 mg/dL    Comment: Performed at [facility], [address], [place], [place] [address]  CBC     Status: None   Collection Time: 11/09/22  7:10 PM  Result Value Ref Range   WBC 8.0 4.0 - 10.5 K/uL   RBC 4.44 4.22 - 5.81 MIL/uL   Hemoglobin 13.5 13.0 - 17.0 g/dL   HCT 09.8 11.9 - 14.7 %   MCV 92.8 80.0 - 100.0 fL   MCH 30.4 26.0 - 34.0 pg   MCHC 32.8 30.0 - 36.0 g/dL   RDW 82.9 56.2 - 13.0 %   Platelets 204 150 - 400 K/uL   nRBC 0.0 0.0 - 0.2 %    Comment: Performed at [facility], [address] [place], [place] [address]  Comprehensive metabolic panel     Status: Abnormal   Collection Time: 11/09/22  7:10 PM  Result Value Ref Range   Sodium 141 135 - 145 mmol/L   Potassium 3.9 3.5 - 5.1 mmol/L   Chloride 110 98 - 111 mmol/L   CO2 24 22 - 32 mmol/L   Glucose, Bld 111 (H) 70 - 99 mg/dL    Comment: Glucose reference range applies only to samples taken after fasting for at least 8 hours.   BUN 19 8 - 23 mg/dL   Creatinine, Ser 4.69 0.61 - 1.24 mg/dL   Calcium 8.1 (L) 8.9 - 10.3 mg/dL   Total Protein 6.3 (L) 6.5 - 8.1 g/dL   Albumin 3.3 (L) 3.5 - 5.0 g/dL   AST 20 15 - 41 U/L   ALT 16 0 - 44 U/L   Alkaline Phosphatase 49 38 - 126 U/L   Total Bilirubin 0.4 0.3 - 1.2 mg/dL   GFR, Estimated >62 >95 mL/min    Comment: (NOTE) Calculated using the CKD-EPI Creatinine Equation (2021)    Anion gap 7 5 - 15    Comment: Performed at [facility], [address], [place], [place] [address]  Troponin I (High Sensitivity)     Status: None   Collection Time: 11/09/22  7:10 PM  Result Value Ref Range   Troponin I (High Sensitivity) 5 <18 ng/L    Comment: (NOTE) Elevated high sensitivity troponin I (hsTnI) values and significant  changes across serial measurements may suggest ACS but many other  chronic and acute conditions are known to elevate hsTnI results.  Refer to the  "Links" section for chest pain algorithms and additional  guidance. Performed at [facility], [address] [place], [place] [address]   TSH     Status: None   Collection Time: 11/09/22  7:10 PM  Result Value Ref Range   TSH 3.645 0.350 - 4.500 uIU/mL    Comment:  Performed by a 3rd Generation assay with a functional sensitivity of <=0.01 uIU/mL. Performed at [facility], [address] [place], [place] [address]   Troponin I (High Sensitivity)     Status: None   Collection Time: 11/09/22  9:41 PM  Result Value Ref Range   Troponin I (High Sensitivity) 6 <18 ng/L    Comment: (NOTE) Elevated high sensitivity troponin I (hsTnI) values and significant  changes across serial measurements may suggest ACS but many other  chronic and acute conditions are known to elevate hsTnI results.  Refer to the "Links" section for chest pain algorithms and additional  guidance. Performed at [facility], [address] [place], [place] [address]    MR BRAIN WO CONTRAST  Result Date: 11/09/2022 CLINICAL DATA:  Transient ischemic attack EXAM: MRI HEAD WITHOUT CONTRAST TECHNIQUE: Multiplanar, multiecho pulse sequences of the brain and surrounding structures were obtained without intravenous contrast. COMPARISON:  12/27/2013 FINDINGS: Brain: No acute infarct, mass effect or extra-axial collection. No acute or chronic hemorrhage. There is multifocal hyperintense T2-weighted signal within the white matter. Parenchymal volume and CSF spaces are normal. Old right cerebellar infarct. The midline structures are normal. Vascular: Major flow voids are preserved. Skull and upper cervical spine: Normal calvarium and skull base. Visualized upper cervical spine and soft tissues are normal. Sinuses/Orbits:No paranasal sinus fluid levels or advanced mucosal thickening. No mastoid or middle ear effusion. Normal orbits. IMPRESSION: 1. No acute intracranial abnormality. 2. Old right cerebellar infarct and  findings of chronic small vessel ischemia. Electronically Signed   By: Deatra Robinson M.D.   On: 11/09/2022 23:31   CT Head Wo Contrast  Result Date: 11/09/2022 CLINICAL DATA:  Altered level of consciousness EXAM: CT HEAD WITHOUT CONTRAST TECHNIQUE: Contiguous axial images were obtained from the base of the skull through the vertex without intravenous contrast. RADIATION DOSE REDUCTION: This exam was performed according to the departmental dose-optimization program which includes automated exposure control, adjustment of the mA and/or kV according to patient size and/or use of iterative reconstruction technique. COMPARISON:  12/27/2013 FINDINGS: Brain: No acute infarct or hemorrhage. Chronic ischemic change within the right cerebellar hemisphere. Lateral ventricles and midline structures are unremarkable. No acute extra-axial fluid collections. No mass effect. Vascular: No hyperdense vessel or unexpected calcification. Skull: Normal. Negative for fracture or focal lesion. Sinuses/Orbits: No acute finding. Other: None. IMPRESSION: 1. No acute intracranial process. Electronically Signed   By: Sharlet Salina M.D.   On: 11/09/2022 20:35   DG Chest 2 View  Result Date: 11/09/2022 CLINICAL DATA:  Near syncope.  Nausea. EXAM: CHEST - 2 VIEW COMPARISON:  Chest radiograph dated 10/07/2018. FINDINGS: No focal consolidation, pleural effusion, or pneumothorax. Stable cardiac silhouette. A loop recorder device is noted. Large hiatal hernia. No acute osseous pathology. IMPRESSION: 1. No active cardiopulmonary disease. 2. Large hiatal hernia. Electronically Signed   By: Elgie Collard M.D.   On: 11/09/2022 20:21    Pending Labs Unresulted Labs (From admission, onward)     Start     Ordered   11/09/22 1917  Urinalysis, Routine w reflex microscopic -Urine, Clean Catch  Once,   URGENT       Question:  Specimen Source  Answer:  Urine, Clean Catch   11/09/22 1916   Signed and Held  Phosphorus  Once,   R        Signed  and Held   Signed and Held  Lipid panel  Once,   R  Signed and Held            Vitals/Pain Today's Vitals   11/10/22 0030 11/10/22 0039 11/10/22 0100 11/10/22 0130  BP: 123/82  124/67 119/71  Pulse: 61  (!) 39 64  Resp: 20  17 (!) 27  Temp:      TempSrc:      SpO2: 97%  95% 96%  PainSc:  0-No pain      Isolation Precautions No active isolations  Medications Medications - No data to display  Mobility walks     Focused Assessments     R Recommendations: See Admitting Provider Note  Report given to:   Additional Notes:

## 2022-11-10 NOTE — Discharge Instructions (Signed)

## 2022-11-10 NOTE — Progress Notes (Signed)
D/C  tele and iv. Went over AVS with pt and all questions were addressed.   Lawson Radar, RN

## 2022-11-10 NOTE — H&P (Signed)
History and Physical   [facility] [place] [facility] Admission History and Physical [employer], D.O.    Patient Name: Austin Taylor MR#: [id number] Date of Birth: 10-10-1940 Date of Admission: 11/09/2022  Referring MD/NP/PA: Dr. Bebe Shaggy Primary Care Physician: [name], MD (Inactive)  Chief Complaint:  Chief Complaint  Patient presents with   Bradycardia    HPI: Austin Taylor is a 82 y.o. male with a known history of CKD, hyperlipidemia, obstructive sleep apnea, persistent atrial fibrillation and CVA on Eliquis presents to the emergency department for evaluation of lightheadedness.  Patient was in a usual state of health until this afternoon when he reports the onset of dizziness and lightheadedness associated with shortness of breath, nausea.  Per EMS he was found to be in A-fib with a heart rate of 45 which improved to 50 after liter of normal saline   Otherwise there has been no change in status. Patient has been taking medication as prescribed and there has been no recent change in medication or diet.  No recent antibiotics.  There has been no recent illness, hospitalizations, travel or sick contacts.    EMS/ED Course: Patient received 1 L NS. Medical admission has been requested for further management of near syncope/symptomatic bradycardia.  Review of Systems:  CONSTITUTIONAL: Positive dizziness, lightheadedness no fever/chills, fatigue, weakness, weight gain/loss, headache. EYES: No blurry or double vision. ENT: No tinnitus, postnasal drip, redness or soreness of the oropharynx. RESPIRATORY: No cough, dyspnea, wheeze.  No hemoptysis.  CARDIOVASCULAR: No chest pain, palpitations, syncope, orthopnea. No lower extremity edema.  GASTROINTESTINAL: Positive nausea, negative vomiting, abdominal pain, diarrhea, constipation.  No hematemesis, melena or hematochezia. GENITOURINARY: No dysuria, frequency, hematuria. ENDOCRINE: No polyuria or nocturia. No heat or cold  intolerance. HEMATOLOGY: No anemia, bruising, bleeding. INTEGUMENTARY: No rashes, ulcers, lesions. MUSCULOSKELETAL: No arthritis, gout. NEUROLOGIC: No numbness, tingling, ataxia, seizure-type activity, weakness. PSYCHIATRIC: No anxiety, depression, insomnia.   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   "small one"; denies residual on 08/25/2016    Past Surgical History:  Procedure Laterality Date   ATRIAL FIBRILLATION ABLATION  08/25/2016   ATRIAL FIBRILLATION ABLATION N/A 08/25/2016   Procedure: Atrial Fibrillation Ablation;  Surgeon: Hillis Range, MD;  Location: [facility] INVASIVE CV LAB;  Service: Cardiovascular;  Laterality: N/A;   CARDIOVERSION N/A 09/09/2015   Procedure: CARDIOVERSION;  Surgeon: Pricilla Riffle, MD;  Location: [facility] ENDOSCOPY;  Service: Cardiovascular;  Laterality: N/A;   CARDIOVERSION N/A 11/20/2015   Procedure: CARDIOVERSION;  Surgeon: Chrystie Nose, MD;  Location: [facility] ENDOSCOPY;  Service: Cardiovascular;  Laterality: N/A;   implantable loop recorder placement  10/24/2019   Medtronic Reveal Kaylor model [id number] (SN # [id number] ) implantable loop recorder implanted.  old device (RRT) removed   LOOP RECORDER INSERTION N/A 04/21/2016   Procedure: Loop Recorder Insertion;  Surgeon: Hillis Range, MD;  Location: [facility] INVASIVE CV LAB;  Service: Cardiovascular;  Laterality: N/A;   MOHS SURGERY     ear   SHOULDER OPEN ROTATOR CUFF REPAIR Left 1998   TEE WITHOUT CARDIOVERSION N/A 08/25/2016   Procedure: TRANSESOPHAGEAL ECHOCARDIOGRAM (TEE);  Surgeon: Jake Bathe, MD;  Location: [facility] ENDOSCOPY;  Service: Cardiovascular;  Laterality: N/A;     reports that he quit smoking about 48 years ago. His smoking use included cigarettes. He started smoking  about 65 years ago. He has a 17 pack-year smoking history. He quit smokeless tobacco use about 47 years ago. He reports current alcohol use of about 2.0 standard drinks of alcohol per week. He reports that he does not use drugs.  Allergies  Allergen Reactions   Codeine Nausea Only         Family History  Problem Relation Age of Onset   Heart disease Mother    Heart disease Father    Other Father        Carotid Artery Stenosis   Heart disease Brother     Prior to Admission medications   Medication Sig Start Date End Date Taking? Authorizing Provider  apixaban (ELIQUIS) 5 MG TABS tablet Take 1 tablet (5 mg total) by mouth 2 (two) times daily. 07/06/22   Meriam Sprague, MD  doxycycline (VIBRAMYCIN) 100 MG capsule Take 100 mg by mouth 2 (two) times daily. 02/04/21   [provider]  ferrous sulfate 325 (65 FE) MG tablet Take 325 mg by mouth every Monday, Wednesday, and Friday.     [provider]  furosemide (LASIX) 40 MG tablet TAKE ONE TABLET BY MOUTH ONCE DAILY 04/08/22   Sherie Don, NP  Probiotic Product (PROBIOTIC PO) Take 1 tablet by mouth daily.    [provider]  sildenafil (VIAGRA) 100 MG tablet 50 mg as needed.    [provider]  simvastatin (ZOCOR) 40 MG tablet TAKE 1 TABLET BY MOUTH ONCE DAILY 11/13/15   Lars Masson, MD    Physical Exam: Vitals:   11/09/22 2353 11/10/22 0000 11/10/22 0015 11/10/22 0030  BP:  117/71 123/75 123/82  Pulse:  (!) 54 (!) 57 61  Resp:  (!) 21 15 20   Temp: 98.2 F (36.8 C)     TempSrc: Temporal     SpO2:  97% 99% 97%    GENERAL: 82 y.o.-year-old white male patient, well-developed, well-nourished lying in the bed in no acute distress.  Pleasant and cooperative.   HEENT: Head atraumatic, normocephalic. Pupils equal. Mucus membranes moist. NECK: Supple. No JVD. CHEST: Normal breath sounds bilaterally. No wheezing, rales, rhonchi or crackles. No use of accessory muscles of respiration.  No  reproducible chest wall tenderness.  CARDIOVASCULAR: Irregular S1, S2 normal. No murmurs, rubs, or gallops. Cap refill <2 seconds. Pulses intact distally.  ABDOMEN: Soft, nondistended, nontender. No rebound, guarding, rigidity. Normoactive bowel sounds present in all four quadrants.  EXTREMITIES: No pedal edema, cyanosis, or clubbing. No calf tenderness or Homan's sign.  NEUROLOGIC: The patient is alert and oriented x 3. Cranial nerves II through XII are grossly intact with no focal sensorimotor deficit. PSYCHIATRIC:  Normal affect, mood, thought content. SKIN: Warm, dry, and intact without obvious rash, lesion, or ulcer.    Labs on Admission:  CBC: Recent Labs  Lab 11/09/22 1910  WBC 8.0  HGB 13.5  HCT 41.2  MCV 92.8  PLT 204   Basic Metabolic Panel: Recent Labs  Lab 11/09/22 1910  NA 141  K 3.9  CL 110  CO2 24  GLUCOSE 111*  BUN 19  CREATININE 1.04  CALCIUM 8.1*  MG 2.1   GFR: CrCl cannot be calculated (Unknown ideal weight.). Liver Function Tests: Recent Labs  Lab 11/09/22 1910  AST 20  ALT 16  ALKPHOS 49  BILITOT 0.4  PROT  6.3*  ALBUMIN 3.3*   No results for input(s): "LIPASE", "AMYLASE" in the last 168 hours. No results for input(s): "AMMONIA" in the last 168 hours. Coagulation Profile: No results for input(s): "INR", "PROTIME" in the last 168 hours. Cardiac Enzymes: No results for input(s): "CKTOTAL", "CKMB", "CKMBINDEX", "TROPONINI" in the last 168 hours. BNP (last 3 results) No results for input(s): "PROBNP" in the last 8760 hours. HbA1C: No results for input(s): "HGBA1C" in the last 72 hours. CBG: No results for input(s): "GLUCAP" in the last 168 hours. Lipid Profile: No results for input(s): "CHOL", "HDL", "LDLCALC", "TRIG", "CHOLHDL", "LDLDIRECT" in the last 72 hours. Thyroid Function Tests: Recent Labs    11/09/22 1910  TSH 3.645   Anemia Panel: No results for input(s): "VITAMINB12", "FOLATE", "FERRITIN", "TIBC", "IRON", "RETICCTPCT" in  the last 72 hours. Urine analysis:    Component Value Date/Time   COLORURINE YELLOW 10/07/2018 1959   APPEARANCEUR HAZY (A) 10/07/2018 1959   LABSPEC 1.029 10/07/2018 1959   PHURINE 5.0 10/07/2018 1959   GLUCOSEU NEGATIVE 10/07/2018 1959   HGBUR NEGATIVE 10/07/2018 1959   BILIRUBINUR NEGATIVE 10/07/2018 1959   KETONESUR NEGATIVE 10/07/2018 1959   PROTEINUR NEGATIVE 10/07/2018 1959   NITRITE NEGATIVE 10/07/2018 1959   LEUKOCYTESUR NEGATIVE 10/07/2018 1959   Sepsis Labs: @LABRCNTIP (procalcitonin:4,lacticidven:4) ) Recent Results (from the past 240 hour(s))  SARS Coronavirus 2 by RT PCR (hospital order, performed in [facility] hospital lab) *cepheid single result test* Anterior Nasal Swab     Status: None   Collection Time: 11/09/22  7:08 PM   Specimen: Anterior Nasal Swab  Result Value Ref Range Status   SARS Coronavirus 2 by RT PCR NEGATIVE NEGATIVE Final    Comment: Performed at [facility], [address] [place], [place] [address]     Radiological Exams on Admission: MR BRAIN WO CONTRAST  Result Date: 11/09/2022 CLINICAL DATA:  Transient ischemic attack EXAM: MRI HEAD WITHOUT CONTRAST TECHNIQUE: Multiplanar, multiecho pulse sequences of the brain and surrounding structures were obtained without intravenous contrast. COMPARISON:  12/27/2013 FINDINGS: Brain: No acute infarct, mass effect or extra-axial collection. No acute or chronic hemorrhage. There is multifocal hyperintense T2-weighted signal within the white matter. Parenchymal volume and CSF spaces are normal. Old right cerebellar infarct. The midline structures are normal. Vascular: Major flow voids are preserved. Skull and upper cervical spine: Normal calvarium and skull base. Visualized upper cervical spine and soft tissues are normal. Sinuses/Orbits:No paranasal sinus fluid levels or advanced mucosal thickening. No mastoid or middle ear effusion. Normal orbits. IMPRESSION: 1. No acute intracranial abnormality. 2. Old  right cerebellar infarct and findings of chronic small vessel ischemia. Electronically Signed   By: Deatra Robinson M.D.   On: 11/09/2022 23:31   CT Head Wo Contrast  Result Date: 11/09/2022 CLINICAL DATA:  Altered level of consciousness EXAM: CT HEAD WITHOUT CONTRAST TECHNIQUE: Contiguous axial images were obtained from the base of the skull through the vertex without intravenous contrast. RADIATION DOSE REDUCTION: This exam was performed according to the departmental dose-optimization program which includes automated exposure control, adjustment of the mA and/or kV according to patient size and/or use of iterative reconstruction technique. COMPARISON:  12/27/2013 FINDINGS: Brain: No acute infarct or hemorrhage. Chronic ischemic change within the right cerebellar hemisphere. Lateral ventricles and midline structures are unremarkable. No acute extra-axial fluid collections. No mass effect. Vascular: No hyperdense vessel or unexpected calcification. Skull: Normal. Negative for fracture or focal lesion. Sinuses/Orbits: No acute finding. Other: None. IMPRESSION: 1. No acute intracranial  process. Electronically Signed   By: Sharlet Salina M.D.   On: 11/09/2022 20:35   DG Chest 2 View  Result Date: 11/09/2022 CLINICAL DATA:  Near syncope.  Nausea. EXAM: CHEST - 2 VIEW COMPARISON:  Chest radiograph dated 10/07/2018. FINDINGS: No focal consolidation, pleural effusion, or pneumothorax. Stable cardiac silhouette. A loop recorder device is noted. Large hiatal hernia. No acute osseous pathology. IMPRESSION: 1. No active cardiopulmonary disease. 2. Large hiatal hernia. Electronically Signed   By: Elgie Collard M.D.   On: 11/09/2022 20:21    EKG: Pending  Assessment/Plan  This is a 82 y.o. male with a history of CKD, hyperlipidemia, obstructive sleep apnea, persistent atrial fibrillation and CVA on Eliquis  now being admitted with:  #. Near Syncope, likely 2/2 symptomatic bradycardia - Admit observation with  telemetry monitoring - IV fluid hydration - Check EKG - Check orthostatics - Check echo - Awaiting loop recorder - Trend trops, check TSH, lipids - Will need cardio consult in AM.   #.  History of persistent atrial fibrillation and CVA - Continue Eliquis, simvastatin  #.  History of obstructive sleep apnea -O2 nightly  #. History of CKD - Monitor BMP  Admission status: Tele obs IV Fluids: LR Diet/Nutrition: Heart healthy Consults called: Cards in AM  DVT Px: Eliquis, SCDs and early ambulation. Code Status: Full Code  Disposition Plan: To home in <24 hours  All the records are reviewed and case discussed with ED provider. Management plans discussed with the patient and/or family who express understanding and agree with plan of care.  Reginald Weida D.O. on 11/10/2022 at 1:06 AM CC: Primary care physician; [name], MD (Inactive)   11/10/2022, 1:06 AM

## 2022-11-10 NOTE — Progress Notes (Signed)
Mobility Specialist Progress Note:   11/10/22 1153  Mobility  Activity Ambulated with assistance in hallway  Level of Assistance Contact guard assist, steadying assist  Assistive Device  (IV Pole)  Distance Ambulated (ft) 300 ft  Activity Response Tolerated well  Mobility Referral Yes  $Mobility charge 1 Mobility  Mobility Specialist Start Time (ACUTE ONLY) 1130  Mobility Specialist Stop Time (ACUTE ONLY) 1145  Mobility Specialist Time Calculation (min) (ACUTE ONLY) 15 min    Pre Mobility: 57 HR , 106/64 BP , 95% SpO2 RA During Mobility: 69 HR  Post Mobility: 58 HR , 94% SpO2 RA  Pt received in bed, agreeable to mobility. Pt denied any feelings of discomfort during ambulation, asymptomatic throughout. Pt returned to bed with call bell in hand and all needs met.   Leory Plowman  Mobility Specialist Please contact via [employer] office at [phone]

## 2022-11-10 NOTE — Consult Note (Addendum)
Cardiology Consultation   Patient ID: STEFON BENALLY MRN: [id number]; DOB: 1941/01/24  Admit date: 11/09/2022 Date of Consult: 11/10/2022  PCP:  [name], MD (Inactive)   [facility] Providers Cardiologist:  Meriam Sprague, MD  Cardiology APP:  Beatrice Lecher, PA-C  Electrophysiologist:  Hillis Range, MD (Inactive)       Patient Profile:   LYN WITHEM is a 82 y.o. male with a hx of OSA on CPAP, HLD,  persistent atrial fib, CVA 2015, atrial fib ablation in 2018, hx Mobitz I, new ILR placed 2021,  chronic diastolic CHF who is being seen 11/10/2022 for the evaluation of bradycardia in atrial fib at the request of Dr Waymon Amato.Marland Kitchen  History of Present Illness:   Mr. Matsunaga with persistent atrial fib, atrial fib ablation in 2018, has ILR new one 2021 and last interrogation 10/09/22 with no atrial fib new symptoms, CKD tachy, brady or pauses.  Last echo 2017 Ef 65-70%   G2DD,  and that was similar to study in 2015. Hx chronic HFpEF on lasix --in office 01/2022 with HR 52  he is on eliquis for CHA2DS2Vasc score of 4.    OV 09/06/22 he had a fib on loop check  he has 1% a fib burden.   Pt presented to ER by EMS 11/09/22 for nausea and dizziness, he was pale, diaphoretic with HR 45 in a fib per note.   He was given 1L of IV fluid bolus.    Loop recorder interrogated.    He tells me he was standing at sink and suddenly was dizzy and broke in out in sweat, his shirt was drenched then he became nauseated.  He sat down.  Then EMS called. He has never had this episode before.  His wife took his BP and 116 or so and he said HR in 60s. No chest pain or tightness no SOB.  He has not had any recent colds or fevers.  He has some SOB with climbing hills but that is ongoing for last couple of years.       Na 141, K+ 3.9 BUN 19, Cr 1.04  HS troponin 5 and 6 WBC 8.0 Hgb 13.5, plts 204  TSH 3.645 COVID neg   2V CXR NAD and large hiatal hernia  CT head no acute process MRI brain no  acute intracranial abnormality, old rt cerebellar infarct and findings of chronic small vessel ischemia  Home meds eliquis, Iron lasix zocor.   BP 109/64 P 57 R 21 afebrile   sp02 95% RA Fluids at 50 cc per hour.    Echo pending   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   "small one"; denies residual on 08/25/2016    Past Surgical History:  Procedure Laterality Date   ATRIAL FIBRILLATION ABLATION  08/25/2016   ATRIAL FIBRILLATION ABLATION N/A 08/25/2016   Procedure: Atrial Fibrillation Ablation;  Surgeon: Hillis Range, MD;  Location: [facility] INVASIVE CV LAB;  Service: Cardiovascular;  Laterality: N/A;   CARDIOVERSION N/A 09/09/2015   Procedure: CARDIOVERSION;  Surgeon: Pricilla Riffle, MD;  Location: [facility] ENDOSCOPY;  Service: Cardiovascular;  Laterality: N/A;   CARDIOVERSION  N/A 11/20/2015   Procedure: CARDIOVERSION;  Surgeon: Chrystie Nose, MD;  Location: [facility] ENDOSCOPY;  Service: Cardiovascular;  Laterality: N/A;   implantable loop recorder placement  10/24/2019   Medtronic Reveal Crocker model [id number] (SN # [id number] ) implantable loop recorder implanted.  old device (RRT) removed   LOOP RECORDER INSERTION N/A 04/21/2016   Procedure: Loop Recorder Insertion;  Surgeon: Hillis Range, MD;  Location: [facility] INVASIVE CV LAB;  Service: Cardiovascular;  Laterality: N/A;   MOHS SURGERY     ear   SHOULDER OPEN ROTATOR CUFF REPAIR Left 1998   TEE WITHOUT CARDIOVERSION N/A 08/25/2016   Procedure: TRANSESOPHAGEAL ECHOCARDIOGRAM (TEE);  Surgeon: Jake Bathe, MD;  Location: [facility] ENDOSCOPY;  Service: Cardiovascular;  Laterality: N/A;     Home Medications:  Prior to Admission medications   Medication Sig Start Date End Date Taking? Authorizing Provider   acetaminophen (TYLENOL) 650 MG CR tablet Take 1,300 mg by mouth as needed for pain.   Yes [provider]  apixaban (ELIQUIS) 5 MG TABS tablet Take 1 tablet (5 mg total) by mouth 2 (two) times daily. 07/06/22  Yes Meriam Sprague, MD  ferrous sulfate 325 (65 FE) MG tablet Take 325 mg by mouth every Monday, Wednesday, and Friday.    Yes [provider]  furosemide (LASIX) 40 MG tablet TAKE ONE TABLET BY MOUTH ONCE DAILY 04/08/22  Yes Sherie Don, NP  Probiotic Product (PROBIOTIC PO) Take 1 tablet by mouth every evening.   Yes [provider]  sildenafil (VIAGRA) 100 MG tablet Take 25-50 mg by mouth as needed for erectile dysfunction.   Yes [provider]  simvastatin (ZOCOR) 40 MG tablet TAKE 1 TABLET BY MOUTH ONCE DAILY Patient taking differently: Take 40 mg by mouth every evening. 11/13/15  Yes Lars Masson, MD    Inpatient Medications: Scheduled Meds:  sodium chloride flush  3 mL Intravenous Q12H   Continuous Infusions:  lactated ringers 50 mL/hr at 11/10/22 0436   PRN Meds: acetaminophen **OR** acetaminophen, alum & mag hydroxide-simeth, bisacodyl, magnesium citrate, ondansetron **OR** ondansetron (ZOFRAN) IV, senna-docusate, traZODone  Allergies:    Allergies  Allergen Reactions   Codeine Nausea Only         Social History:   Social History   Socioeconomic History   Marital status: Married    Spouse name: Not on file   Number of children: 2   Years of education: 14   Highest education level: Not on file  Occupational History   Occupation: Careers information officer: OTHER    Comment: Retired  Tobacco Use   Smoking status: Former    Current packs/day: 0.00    Average packs/day: 1 pack/day for 17.0 years (17.0 ttl pk-yrs)    Types: Cigarettes    Start date: 08/01/1957    Quit date: 08/02/1974    Years since quitting: 48.3   Smokeless tobacco: Former    Quit date: 1977  Vaping Use   Vaping status: Never Used  Substance  and Sexual Activity   Alcohol use: Yes    Alcohol/week: 2.0 standard drinks of alcohol    Types: 2 Cans of beer per week   Drug use: No   Sexual activity: Yes  Other Topics Concern   Not on file  Social History Narrative   Patient is married with 2 children.   Patient is right handed.   Patient has 14 yrs of education.   Patient does not drink caffeine.   Social Determinants  of Health   Financial Resource Strain: Not on file  Food Insecurity: No Food Insecurity (11/10/2022)   Hunger Vital Sign    Worried About Running Out of Food in the Last Year: Never true    Ran Out of Food in the Last Year: Never true  Transportation Needs: No Transportation Needs (11/10/2022)   PRAPARE - Administrator, Civil Service (Medical): No    Lack of Transportation (Non-Medical): No  Physical Activity: Not on file  Stress: Not on file  Social Connections: Not on file  Intimate Partner Violence: Not At Risk (11/10/2022)   Humiliation, Afraid, Rape, and Kick questionnaire    Fear of Current or Ex-Partner: No    Emotionally Abused: No    Physically Abused: No    Sexually Abused: No    Family History:    Family History  Problem Relation Age of Onset   Heart disease Mother    Heart disease Father    Other Father        Carotid Artery Stenosis   Heart disease Brother      ROS:  Please see the history of present illness.  General:no colds or fevers, no weight changes Skin:no rashes or ulcers HEENT:no blurred vision, no congestion CV:see HPI PUL:see HPI GI:no diarrhea constipation or melena, no indigestion GU:no hematuria, no dysuria MS:no joint pain, no claudication Neuro:no syncope, no lightheadedness hx of CVA Endo:no diabetes, no thyroid disease  All other ROS reviewed and negative.     Physical Exam/Data:   Vitals:   11/10/22 0130 11/10/22 0209 11/10/22 0607 11/10/22 1140  BP: 119/71 (!) 145/73  109/64  Pulse: 64 62 (!) 55 (!) 54  Resp: (!) 27 20 19 20   Temp:  98  F (36.7 C) 97.9 F (36.6 C) 97.8 F (36.6 C)  TempSrc:  Oral Oral Oral  SpO2: 96% 95% 94% 95%  Weight:  104.3 kg    Height:  5\' 11"  (1.803 m)      Intake/Output Summary (Last 24 hours) at 11/10/2022 1246 Last data filed at 11/10/2022 0900 Gross per 24 hour  Intake 1580.78 ml  Output --  Net 1580.78 ml      11/10/2022    2:09 AM 02/02/2022    8:21 AM 02/05/2021    2:16 PM  Last 3 Weights  Weight (lbs) 229 lb 15 oz 238 lb 238 lb  Weight (kg) 104.3 kg 107.956 kg 107.956 kg     Body mass index is 32.07 kg/m.  General:  Well nourished, well developed, in no acute distress HEENT: normal Neck: no JVD Vascular: No carotid bruits; Distal pulses 2+ bilaterally Cardiac:  normal S1, S2; RRR; no murmur gallup rub or click Lungs:  clear to auscultation bilaterally, no wheezing, rhonchi or rales  Abd: soft, nontender, no hepatomegaly  Ext: no edema Musculoskeletal:  No deformities, BUE and BLE strength normal and equal Skin: warm and dry  Neuro:  alert and oriented X 3 MAE follows commands, no focal abnormalities noted Psych:  Normal affect   EKG:  The EKG was personally reviewed and demonstrates:  SB at 54 and 1st degree AV block 305 ms and PAC with longer PR interval. Telemetry:  Telemetry was personally reviewed and demonstrates:  Mobitz I with HR 53  with wide complex ? Escape beat.  Relevant CV Studies: Echocardiogram 11/06/2015 EF 65-70, no RWMA, GRII DD, mild LAE, normal RVSF, mild TR, PASP 33, trivial pericardial effusion   Carotid US 12/29/2013 Bilateral ICA 1-39  Echo pending  Laboratory Data:  High Sensitivity Troponin:   Recent Labs  Lab 11/09/22 1910 11/09/22 2141 11/10/22 0314 11/10/22 0942  TROPONINIHS 5 6 6 6      Chemistry Recent Labs  Lab 11/09/22 1910  NA 141  K 3.9  CL 110  CO2 24  GLUCOSE 111*  BUN 19  CREATININE 1.04  CALCIUM 8.1*  MG 2.1  GFRNONAA >60  ANIONGAP 7    Recent Labs  Lab 11/09/22 1910  PROT 6.3*  ALBUMIN 3.3*  AST 20   ALT 16  ALKPHOS 49  BILITOT 0.4   Lipids  Recent Labs  Lab 11/10/22 0314  CHOL 132  TRIG 64  HDL 38*  LDLCALC 81  CHOLHDL 3.5    Hematology Recent Labs  Lab 11/09/22 1910  WBC 8.0  RBC 4.44  HGB 13.5  HCT 41.2  MCV 92.8  MCH 30.4  MCHC 32.8  RDW 13.0  PLT 204   Thyroid  Recent Labs  Lab 11/09/22 1910  TSH 3.645    BNPNo results for input(s): "BNP", "PROBNP" in the last 168 hours.  DDimer No results for input(s): "DDIMER" in the last 168 hours.   Radiology/Studies:  MR BRAIN WO CONTRAST  Result Date: 11/09/2022 CLINICAL DATA:  Transient ischemic attack EXAM: MRI HEAD WITHOUT CONTRAST TECHNIQUE: Multiplanar, multiecho pulse sequences of the brain and surrounding structures were obtained without intravenous contrast. COMPARISON:  12/27/2013 FINDINGS: Brain: No acute infarct, mass effect or extra-axial collection. No acute or chronic hemorrhage. There is multifocal hyperintense T2-weighted signal within the white matter. Parenchymal volume and CSF spaces are normal. Old right cerebellar infarct. The midline structures are normal. Vascular: Major flow voids are preserved. Skull and upper cervical spine: Normal calvarium and skull base. Visualized upper cervical spine and soft tissues are normal. Sinuses/Orbits:No paranasal sinus fluid levels or advanced mucosal thickening. No mastoid or middle ear effusion. Normal orbits. IMPRESSION: 1. No acute intracranial abnormality. 2. Old right cerebellar infarct and findings of chronic small vessel ischemia. Electronically Signed   By: Deatra Robinson M.D.   On: 11/09/2022 23:31   CT Head Wo Contrast  Result Date: 11/09/2022 CLINICAL DATA:  Altered level of consciousness EXAM: CT HEAD WITHOUT CONTRAST TECHNIQUE: Contiguous axial images were obtained from the base of the skull through the vertex without intravenous contrast. RADIATION DOSE REDUCTION: This exam was performed according to the departmental dose-optimization program which  includes automated exposure control, adjustment of the mA and/or kV according to patient size and/or use of iterative reconstruction technique. COMPARISON:  12/27/2013 FINDINGS: Brain: No acute infarct or hemorrhage. Chronic ischemic change within the right cerebellar hemisphere. Lateral ventricles and midline structures are unremarkable. No acute extra-axial fluid collections. No mass effect. Vascular: No hyperdense vessel or unexpected calcification. Skull: Normal. Negative for fracture or focal lesion. Sinuses/Orbits: No acute finding. Other: None. IMPRESSION: 1. No acute intracranial process. Electronically Signed   By: Sharlet Salina M.D.   On: 11/09/2022 20:35   DG Chest 2 View  Result Date: 11/09/2022 CLINICAL DATA:  Near syncope.  Nausea. EXAM: CHEST - 2 VIEW COMPARISON:  Chest radiograph dated 10/07/2018. FINDINGS: No focal consolidation, pleural effusion, or pneumothorax. Stable cardiac silhouette. A loop recorder device is noted. Large hiatal hernia. No acute osseous pathology. IMPRESSION: 1. No active cardiopulmonary disease. 2. Large hiatal hernia. Electronically Signed   By: Elgie Collard M.D.   On: 11/09/2022 20:21     Assessment and Plan:   Dizziness diaphoresis then nausea, HR 45 thought  to be atrial fib.  But from stips in media SR to SB with PACs and first degree AV block.  Hx of Mobitz 1 on EKG    Persistent atrial fib , with a fib episodes in the past he did not have dizziness.  May have been acute brady episode with his Mobitz 1 that is episodic.    Echo pending may direct care. Await results of loop interrogation. OSA on CPAP Hx of CVA in 2015, no new changes on MRI of brain.   Risk Assessment/Risk Scores:     CHA2DS2-VASc Score = 4   This indicates a 4.8% annual risk of stroke. The patient's score is based upon: CHF History: 0 HTN History: 0 Diabetes History: 0 Stroke History: 2 Vascular Disease History: 0 Age Score: 2 Gender Score: 0     For questions or  updates, please contact [facility] Please consult [email or web address] for contact info under    Signed, Nada Boozer, NP  11/10/2022 12:46 PM  Patient seen and examined   I agree with findings as noted abovye by L INgold Pt is an 82 yo with hx of HFpEF, atrial fibrillation (s/p ablation in 2018), CVA.  The pt is s/p ILR in 2021 On 11/09/22 he developed dizziness, nausea and diaphoresiswhile slicing up a cantalope   Had felt fine prior  Had been outside in yard earlier but says he was keeping up with fluids    EMS called  PT had HR of 45 per note  He was given 1L IV fluids and brought to ER    ILR has been interrrogated showing no pauses or arrhythmias  Here he has not been dizzy     Tele shows SR with first degree AV  block, occasional atrial tach (brief),occasional Type I second degree AV block Echo today shows normal LV and RV function  ON exam, PT comfortable   sitting in bed  Orthostatics yesterday negatve  Neck JVP is normal Lung CTA Cardiac RRR  No murmurs   Abd benign Ext are without edema  IMpression Dizziness   No significant arrhythmia found   ? Vagal spell The pt does have conduction system dz but nothing that to require PPM at this point in time   REcomm OK to d/c home    Hydrate   Stay active    WIll make sure he has appt in clinic for follow up  Dietrich Pates MD

## 2022-11-10 NOTE — Discharge Summary (Signed)
Physician Discharge Summary  Austin Taylor JXB:[id number] DOB: [birth year]  PCP: Austin Else, MD (Inactive)  Admitted from: Home Discharged to: Home  Admit date: 11/09/2022 Discharge date: 11/10/2022  Recommendations for Outpatient Follow-up:    Follow-up Information     Austin Lemming, MD Follow up.   Specialty: Cardiology Why: the office should call with date and time.  if you have not heard by Thursday please call the office Contact information: [address] [place] [address] [phone]         Austin Else, MD. Schedule an appointment as soon as possible for a visit in 1 week(s).   Specialty: Family Medicine Contact information: [phone] [place] [address] [phone]                  Home Health: None    Equipment/Devices: None    Discharge Condition: Improved and stable.   Code Status: Full Code Diet recommendation:  Discharge Diet Orders (From admission, onward)     Start     Ordered   11/10/22 0000  Diet - low sodium heart healthy        11/10/22 1636             Discharge Diagnoses:  Principal Problem:   Near syncope Active Problems:   Symptomatic bradycardia   Brief Summary: 82 year old married male, lives in a 1 level house, independent but not physically very active, walks half a mile about 3 times per week, has DOE climbing up a slope close to his house, PMH of persistent atrial fibrillation, s/p prior DCCV and ablation, first-degree AV block, loop recorder placement, chronic diastolic CHF, stroke, OSA on CPAP, HLD, stage II CKD, presented to the ED on 11/09/2022 with complaints of lightheadedness and profuse sweating.  Patient states that he was at his brother's house that afternoon, they were sitting out in the porch, he then returned home with a cantaloupe at approximately 5 PM.  He was at the kitchen counter cutting the cantaloupe, noticed that it might have been spoiled, took 2  bites, subsequently started feeling lightheaded and was sweating.  He denied dizziness, near-syncope or syncope.  No chest pain or palpitations.  No strokelike symptoms reported.  His wife states that he sweated so profusely that his shirt got wet.  Spouse so volunteers that he usually does not drink enough water.  She checked BP at home which was 130/83, he has a device to check his heart rate and rhythm and was noted to be sinus rhythm at 68.  He did not lie down but was sitting up.  Symptoms appear to last for about 30 minutes and since they were persisting, they decided to call EMS.  Per EMS, he was found to be in A-fib with a heart rate of 45 which improved to 50 after a liter of normal saline.  In ED, afebrile, not tachypneic, initial A-fib with heart rate in the 50s, subsequently sinus bradycardia with first-degree AV block with heart rate in the 50s.  Normotensive and not hypoxic.  Lab work including CMP only remarkable for glucose 111, albumin 3.3, total protein 6.3, calcium 8.1.  CBC is normal.  TSH 3.645.  SARS coronavirus are 2 by PCR negative.  Serial at bedtime troponins negative.  Lipid panel unremarkable.  Imaging including MRI brain without acute intracranial abnormality, CT head showed no acute intracranial process and chest x-ray showed no active cardiopulmonary disease but showed a large hiatal  hernia.  He was observed on telemetry which showed sinus bradycardia in the 50s with first-degree AV block.  Orthostatic vital signs were negative.  He was briefly hydrated with IV fluids.  Echo was unremarkable with preserved LVEF.  Loop recorder interrogation deferred to Cardiology.  As per cardiology follow-up, they did not find any significant arrhythmias.  Suspect vagal spell.  They indicate that he does have conduction system disease but nothing that requires PPM at this time.  They have cleared him for discharge home, stay hydrated and they will arrange outpatient follow-up with EP  cardiology.  I had discussed with patient and spouse at bedside this morning and advised them the same thing that this episode may have been related to the hot weather, relative volume depletion and advised him to keep adequately hydrated, gradual activity and avoid prolonged heat exposure.  They verbalized understanding.   Consultations: Cardiology  Procedures: None   Discharge Instructions  Discharge Instructions     (HEART FAILURE PATIENTS) Call MD:  Anytime you have any of the following symptoms: 1) 3 pound weight gain in 24 hours or 5 pounds in 1 week 2) shortness of breath, with or without a dry hacking cough 3) swelling in the hands, feet or stomach 4) if you have to sleep on extra pillows at night in order to breathe.   Complete by: As directed    Call MD for:  difficulty breathing, headache or visual disturbances   Complete by: As directed    Call MD for:  extreme fatigue   Complete by: As directed    Call MD for:  persistant dizziness or light-headedness   Complete by: As directed    Diet - low sodium heart healthy   Complete by: As directed    Increase activity slowly   Complete by: As directed         Medication List     TAKE these medications    acetaminophen 650 MG CR tablet Commonly known as: TYLENOL Take 1,300 mg by mouth as needed for pain.   apixaban 5 MG Tabs tablet Commonly known as: Eliquis Take 1 tablet (5 mg total) by mouth 2 (two) times daily.   ferrous sulfate 325 (65 FE) MG tablet Take 325 mg by mouth every Monday, Wednesday, and Friday.   furosemide 40 MG tablet Commonly known as: LASIX TAKE ONE TABLET BY MOUTH ONCE DAILY   PROBIOTIC PO Take 1 tablet by mouth every evening.   sildenafil 100 MG tablet Commonly known as: VIAGRA Take 25-50 mg by mouth as needed for erectile dysfunction.   simvastatin 40 MG tablet Commonly known as: ZOCOR TAKE 1 TABLET BY MOUTH ONCE DAILY What changed: when to take this       Allergies   Allergen Reactions   Codeine Nausea Only           Procedures/Studies: ECHOCARDIOGRAM COMPLETE  Result Date: 11/10/2022    ECHOCARDIOGRAM REPORT   Patient Name:   Austin Taylor Date of Exam: 11/10/2022 Medical Rec #:  [id number]       Height:       71.0 in Accession #:    [id number]      Weight:       229.9 lb Date of Birth:  22-Jan-1941      BSA:          2.237 m Patient Age:    81 years        BP:  109/64 mmHg Patient Gender: M               HR:           59 bpm. Exam Location:  Inpatient Procedure: 2D Echo, Cardiac Doppler and Color Doppler Indications:    Syncope R55  History:        Patient has no prior history of Echocardiogram examinations.                 CHF, Stroke, Arrythmias:Atrial Fibrillation,                 Signs/Symptoms:Syncope; Risk Factors:Hypertension, Dyslipidemia                 and Former Smoker.  Sonographer:    Dondra Prader RVT Referring Phys: [id number] Rameen Quinney D Breccan Galant  Sonographer Comments: Technically difficult study due to poor echo windows and suboptimal subcostal window. Image acquisition challenging due to patient body habitus. IMPRESSIONS  1. Left ventricular ejection fraction, by estimation, is 70 to 75%. The left ventricle has hyperdynamic function. The left ventricle has no regional wall motion abnormalities. There is moderate asymmetric left ventricular hypertrophy of the septal segment. Left ventricular diastolic parameters are indeterminate.  2. Right ventricular systolic function is hyperdynamic. The right ventricular size is normal.  3. The mitral valve is grossly normal. Trivial mitral valve regurgitation. No evidence of mitral stenosis.  4. The aortic valve is tricuspid. There is mild thickening of the aortic valve. Aortic valve regurgitation is not visualized. No aortic stenosis is present.  5. The inferior vena cava is dilated in size with >50% respiratory variability, suggesting right atrial pressure of 8 mmHg. Comparison(s): No prior Echocardiogram.  FINDINGS  Left Ventricle: Left ventricular ejection fraction, by estimation, is 70 to 75%. The left ventricle has hyperdynamic function. The left ventricle has no regional wall motion abnormalities. The left ventricular internal cavity size was normal in size. There is moderate asymmetric left ventricular hypertrophy of the septal segment. Left ventricular diastolic parameters are indeterminate. Right Ventricle: The right ventricular size is normal. No increase in right ventricular wall thickness. Right ventricular systolic function is hyperdynamic. Left Atrium: Left atrial size was normal in size. Right Atrium: Right atrial size was normal in size. Pericardium: There is no evidence of pericardial effusion. Mitral Valve: The mitral valve is grossly normal. Trivial mitral valve regurgitation. No evidence of mitral valve stenosis. Tricuspid Valve: The tricuspid valve is normal in structure. Tricuspid valve regurgitation is not demonstrated. No evidence of tricuspid stenosis. Aortic Valve: The aortic valve is tricuspid. There is mild thickening of the aortic valve. Aortic valve regurgitation is not visualized. No aortic stenosis is present. Aortic valve mean gradient measures 5.0 mmHg. Aortic valve peak gradient measures 9.2 mmHg. Aortic valve area, by VTI measures 2.45 cm. Pulmonic Valve: The pulmonic valve was normal in structure. Pulmonic valve regurgitation is trivial. No evidence of pulmonic stenosis. Aorta: The aortic root and ascending aorta are structurally normal, with no evidence of dilitation. Venous: The inferior vena cava is dilated in size with greater than 50% respiratory variability, suggesting right atrial pressure of 8 mmHg. IAS/Shunts: No atrial level shunt detected by color flow Doppler.  LEFT VENTRICLE PLAX 2D LVIDd:         4.30 cm   Diastology LVIDs:         2.30 cm   LV e' medial:    8.55 cm/s LV PW:         1.10 cm  LV E/e' medial:  16.1 LV IVS:        1.30 cm   LV e' lateral:   8.39 cm/s  LVOT diam:     1.80 cm   LV E/e' lateral: 16.4 LV SV:         77 LV SV Index:   35 LVOT Area:     2.54 cm  RIGHT VENTRICLE             IVC RV Basal diam:  3.10 cm     IVC diam: 2.50 cm RV S prime:     17.70 cm/s TAPSE (M-mode): 3.1 cm LEFT ATRIUM             Index        RIGHT ATRIUM           Index LA diam:        3.90 cm 1.74 cm/m   RA Area:     16.30 cm LA Vol (A2C):   54.2 ml 24.20 ml/m  RA Volume:   42.70 ml  19.09 ml/m LA Vol (A4C):   31.4 ml 14.03 ml/m LA Biplane Vol: 39.7 ml 17.74 ml/m  AORTIC VALVE                    PULMONIC VALVE AV Area (Vmax):    2.36 cm     PV Vmax:       1.08 m/s AV Area (Vmean):   2.44 cm     PV Peak grad:  4.6 mmHg AV Area (VTI):     2.45 cm AV Vmax:           151.67 cm/s AV Vmean:          98.900 cm/s AV VTI:            0.316 m AV Peak Grad:      9.2 mmHg AV Mean Grad:      5.0 mmHg LVOT Vmax:         140.50 cm/s LVOT Vmean:        94.800 cm/s LVOT VTI:          0.304 m LVOT/AV VTI ratio: 0.96  AORTA Ao Root diam: 3.30 cm Ao Asc diam:  3.00 cm MITRAL VALVE MV Area (PHT): 3.40 cm     SHUNTS MV Decel Time: 223 msec     Systemic VTI:  0.30 m MV E velocity: 137.67 cm/s  Systemic Diam: 1.80 cm MV A velocity: 64.83 cm/s MV E/A ratio:  2.12 Riley Lam MD Electronically signed by Riley Lam MD Signature Date/Time: 11/10/2022/3:22:47 PM    Final    MR BRAIN WO CONTRAST  Result Date: 11/09/2022 CLINICAL DATA:  Transient ischemic attack EXAM: MRI HEAD WITHOUT CONTRAST TECHNIQUE: Multiplanar, multiecho pulse sequences of the brain and surrounding structures were obtained without intravenous contrast. COMPARISON:  12/27/2013 FINDINGS: Brain: No acute infarct, mass effect or extra-axial collection. No acute or chronic hemorrhage. There is multifocal hyperintense T2-weighted signal within the white matter. Parenchymal volume and CSF spaces are normal. Old right cerebellar infarct. The midline structures are normal. Vascular: Major flow voids are preserved. Skull  and upper cervical spine: Normal calvarium and skull base. Visualized upper cervical spine and soft tissues are normal. Sinuses/Orbits:No paranasal sinus fluid levels or advanced mucosal thickening. No mastoid or middle ear effusion. Normal orbits. IMPRESSION: 1. No acute intracranial abnormality. 2. Old right cerebellar infarct and findings of chronic small vessel ischemia. Electronically Signed   By: Caryn Bee  Chase Picket M.D.   On: 11/09/2022 23:31   CT Head Wo Contrast  Result Date: 11/09/2022 CLINICAL DATA:  Altered level of consciousness EXAM: CT HEAD WITHOUT CONTRAST TECHNIQUE: Contiguous axial images were obtained from the base of the skull through the vertex without intravenous contrast. RADIATION DOSE REDUCTION: This exam was performed according to the departmental dose-optimization program which includes automated exposure control, adjustment of the mA and/or kV according to patient size and/or use of iterative reconstruction technique. COMPARISON:  12/27/2013 FINDINGS: Brain: No acute infarct or hemorrhage. Chronic ischemic change within the right cerebellar hemisphere. Lateral ventricles and midline structures are unremarkable. No acute extra-axial fluid collections. No mass effect. Vascular: No hyperdense vessel or unexpected calcification. Skull: Normal. Negative for fracture or focal lesion. Sinuses/Orbits: No acute finding. Other: None. IMPRESSION: 1. No acute intracranial process. Electronically Signed   By: Sharlet Salina M.D.   On: 11/09/2022 20:35   DG Chest 2 View  Result Date: 11/09/2022 CLINICAL DATA:  Near syncope.  Nausea. EXAM: CHEST - 2 VIEW COMPARISON:  Chest radiograph dated 10/07/2018. FINDINGS: No focal consolidation, pleural effusion, or pneumothorax. Stable cardiac silhouette. A loop recorder device is noted. Large hiatal hernia. No acute osseous pathology. IMPRESSION: 1. No active cardiopulmonary disease. 2. Large hiatal hernia. Electronically Signed   By: Elgie Collard M.D.    On: 11/09/2022 20:21      Subjective: Seen this morning.  No recurrence of symptoms.  History as noted above.  Discharge Exam:  Vitals:   11/10/22 0130 11/10/22 0209 11/10/22 0607 11/10/22 1140  BP: 119/71 (!) 145/73  109/64  Pulse: 64 62 (!) 55 (!) 54  Resp: (!) 27 20 19 20   Temp:  98 F (36.7 C) 97.9 F (36.6 C) 97.8 F (36.6 C)  TempSrc:  Oral Oral Oral  SpO2: 96% 95% 94% 95%  Weight:  104.3 kg    Height:  5\' 11"  (1.803 m)      General: Pt lying comfortably in bed & appears in no obvious distress. Cardiovascular: S1 & S2 heard, RRR, S1/S2 +. No murmurs, rubs, gallops or clicks. No JVD or pedal edema.  Telemetry personally reviewed: Sinus bradycardia in the 50s with first-degree AV block. Respiratory: Clear to auscultation without wheezing, rhonchi or crackles. No increased work of breathing. Abdominal:  Non distended, non tender & soft. No organomegaly or masses appreciated. Normal bowel sounds heard. CNS: Alert and oriented. No focal deficits. Extremities: no edema, no cyanosis    The results of significant diagnostics from this hospitalization (including imaging, microbiology, ancillary and laboratory) are listed below for reference.     Microbiology: Recent Results (from the past 240 hour(s))  SARS Coronavirus 2 by RT PCR (hospital order, performed in [facility] hospital lab) *cepheid single result test* Anterior Nasal Swab     Status: None   Collection Time: 11/09/22  7:08 PM   Specimen: Anterior Nasal Swab  Result Value Ref Range Status   SARS Coronavirus 2 by RT PCR NEGATIVE NEGATIVE Final    Comment: Performed at [facility], [address] [place], [place] [address]     Labs: CBC: Recent Labs  Lab 11/09/22 1910  WBC 8.0  HGB 13.5  HCT 41.2  MCV 92.8  PLT 204    Basic Metabolic Panel: Recent Labs  Lab 11/09/22 1910 11/10/22 0314  NA 141  --   K 3.9  --   CL 110  --   CO2 24  --   GLUCOSE 111*  --  BUN 19  --   CREATININE 1.04   --   CALCIUM 8.1*  --   MG 2.1  --   PHOS  --  3.3    Liver Function Tests: Recent Labs  Lab 11/09/22 1910  AST 20  ALT 16  ALKPHOS 49  BILITOT 0.4  PROT 6.3*  ALBUMIN 3.3*    CBG: Recent Labs  Lab 11/10/22 0550  GLUCAP 105*     Lipid Profile Recent Labs    11/10/22 0314  CHOL 132  HDL 38*  LDLCALC 81  TRIG 64  CHOLHDL 3.5    Thyroid function studies Recent Labs    11/09/22 1910  TSH 3.645    I discussed in detail with patient's spouse at bedside.  Time coordinating discharge: 25 minutes  SIGNED:  Marcellus Scott, MD,  FACP, [facility], [facility], [facility], [facility]   [facility] Ad[facility] [place]     To contact the attending provider between 7A-7P or the covering provider during after hours 7P-7A, please log into the web site [email or web address] and access using universal [place] password for that web site. If you do not have the password, please call the hospital operator.

## 2022-11-12 LAB — CUP PACEART REMOTE DEVICE CHECK
Date Time Interrogation Session: 20240828231439
Implantable Pulse Generator Implant Date: 20210810

## 2022-11-17 ENCOUNTER — Ambulatory Visit (INDEPENDENT_AMBULATORY_CARE_PROVIDER_SITE_OTHER): Payer: Medicare Other

## 2022-11-17 DIAGNOSIS — I4819 Other persistent atrial fibrillation: Secondary | ICD-10-CM | POA: Diagnosis not present

## 2022-11-25 NOTE — Progress Notes (Signed)
Carelink Summary Report / Loop Recorder 

## 2022-12-01 DIAGNOSIS — Z961 Presence of intraocular lens: Secondary | ICD-10-CM | POA: Diagnosis not present

## 2022-12-01 DIAGNOSIS — H26493 Other secondary cataract, bilateral: Secondary | ICD-10-CM | POA: Diagnosis not present

## 2022-12-01 DIAGNOSIS — H26491 Other secondary cataract, right eye: Secondary | ICD-10-CM | POA: Diagnosis not present

## 2022-12-01 DIAGNOSIS — H18413 Arcus senilis, bilateral: Secondary | ICD-10-CM | POA: Diagnosis not present

## 2022-12-03 ENCOUNTER — Encounter: Payer: Self-pay | Admitting: Student

## 2022-12-03 ENCOUNTER — Ambulatory Visit: Payer: Medicare Other | Attending: Student | Admitting: Student

## 2022-12-03 VITALS — BP 118/68 | HR 58 | Ht 71.0 in | Wt 236.0 lb

## 2022-12-03 DIAGNOSIS — Z95818 Presence of other cardiac implants and grafts: Secondary | ICD-10-CM

## 2022-12-03 DIAGNOSIS — G4733 Obstructive sleep apnea (adult) (pediatric): Secondary | ICD-10-CM

## 2022-12-03 DIAGNOSIS — I441 Atrioventricular block, second degree: Secondary | ICD-10-CM

## 2022-12-03 NOTE — Progress Notes (Signed)
Electrophysiology Office Note:   Date:  12/03/2022  ID:  Austin Taylor, DOB June 26, 1940, MRN [id number]  Primary Cardiologist: Austin Pates, MD Electrophysiologist: Austin Lemming, MD      History of Present Illness:   Austin Taylor is a 82 y.o. male with h/o hx of OSA on CPAP, HLD,  persistent atrial fib, CVA 2015, atrial fib ablation in 2018, hx Mobitz I, new ILR placed 2021,  chronic diastolic CHF  seen today for post hospital follow up.    Admitted 8/26 - 8/27 with dizziness and lightheadedness after taking a bite of cantaloupe. His wife stated he had severe diaphoresis and sweat through his shirt. BP was OK, HR on his check was 68.  By reports was in 26, but EMS documentation shows HR in the 50s.   2V CXR NAD and large hiatal hernia  CT head no acute process MRI brain no acute intracranial abnormality, old rt cerebellar infarct and findings of chronic small vessel ischemia  Felt to be likely vagal. Loop recorder unremarkable.   Since discharge from hospital the patient reports doing very well. No further episodes.  he denies chest pain, palpitations, dyspnea, PND, orthopnea, nausea, vomiting, dizziness, syncope, edema, weight gain, or early satiety.   Review of systems complete and found to be negative unless listed in HPI.   Device History: Medtronic loop recorder implanted 04/2016, re-implant 10/24/2019 for management of atrial fibrillation   Studies Reviewed:    EKG is not ordered today. EKG from 11/09/2022 reviewed which showed sinus bradycardia at 52 with 1st degree AV block of 250 ms  Physical Exam:   VS:  BP 118/68   Pulse (!) 58   Ht 5\' 11"  (1.803 m)   Wt 236 lb (107 kg)   SpO2 96%   BMI 32.92 kg/m    Wt Readings from Last 3 Encounters:  12/03/22 236 lb (107 kg)  11/10/22 229 lb 15 oz (104.3 kg)  02/02/22 238 lb (108 kg)     GEN: Well nourished, well developed in no acute distress NECK: No JVD; No carotid bruits CARDIAC: Regular rate and rhythm, no  murmurs, rubs, gallops RESPIRATORY:  Clear to auscultation without rales, wheezing or rhonchi  ABDOMEN: Soft, non-tender, non-distended EXTREMITIES:  No edema; No deformity   ILR Interrogation- reviewed in detail today,  See PACEART report  ASSESSMENT AND PLAN:    Atrial fibrillation s/p Medtronic Loop recorder Normal device function See Pace Art report No changes today  Echo 11/10/2022   Mobitz 1 second degree AV block Stable, asymptomatic Recent episode of lightheadedness sounded vagal.  Will adjust loop settings for bradycardia and pauses.  Added brady episodes for HRs < 40 for 8 beats and Pauses > 4.5 seconds If non-actionable alerts increase, can re-adjust.   OSA  Encouraged nightly CPAP   HLD Continue statin   Follow up with EP APP or MD in 3 months  Signed, Graciella Freer, PA-C

## 2022-12-03 NOTE — Patient Instructions (Signed)
Medication Instructions:  Your physician recommends that you continue on your current medications as directed. Please refer to the Current Medication list given to you today.  *If you need a refill on your cardiac medications before your next appointment, please call your pharmacy*  Lab Work: None ordered If you have labs (blood work) drawn today and your tests are completely normal, you will receive your results only by: MyChart Message (if you have MyChart) OR A paper copy in the mail If you have any lab test that is abnormal or we need to change your treatment, we will call you to review the results.  Follow-Up: At [facility], you and your health needs are our priority.  As part of our continuing mission to provide you with exceptional heart care, we have created designated Provider Care Teams.  These Care Teams include your primary Cardiologist (physician) and Advanced Practice Providers (APPs -  Physician Assistants and Nurse Practitioners) who all work together to provide you with the care you need, when you need it.  Your next appointment:   3 month(s)  Provider:   Casimiro Needle "Otilio Saber, PA-C

## 2022-12-08 DIAGNOSIS — H2511 Age-related nuclear cataract, right eye: Secondary | ICD-10-CM | POA: Diagnosis not present

## 2022-12-15 LAB — CUP PACEART REMOTE DEVICE CHECK
Date Time Interrogation Session: 20240930232030
Implantable Pulse Generator Implant Date: 20210810

## 2022-12-21 ENCOUNTER — Ambulatory Visit (INDEPENDENT_AMBULATORY_CARE_PROVIDER_SITE_OTHER): Payer: Medicare Other

## 2022-12-21 DIAGNOSIS — I4819 Other persistent atrial fibrillation: Secondary | ICD-10-CM | POA: Diagnosis not present

## 2022-12-23 ENCOUNTER — Other Ambulatory Visit: Payer: Self-pay | Admitting: Student

## 2022-12-23 DIAGNOSIS — I5033 Acute on chronic diastolic (congestive) heart failure: Secondary | ICD-10-CM

## 2022-12-23 DIAGNOSIS — I5032 Chronic diastolic (congestive) heart failure: Secondary | ICD-10-CM

## 2022-12-23 DIAGNOSIS — E785 Hyperlipidemia, unspecified: Secondary | ICD-10-CM

## 2022-12-23 DIAGNOSIS — I480 Paroxysmal atrial fibrillation: Secondary | ICD-10-CM

## 2022-12-23 DIAGNOSIS — E7849 Other hyperlipidemia: Secondary | ICD-10-CM

## 2022-12-23 MED ORDER — FUROSEMIDE 40 MG PO TABS
40.0000 mg | ORAL_TABLET | Freq: Every day | ORAL | 3 refills | Status: DC
Start: 2022-12-23 — End: 2023-12-10

## 2022-12-23 MED ORDER — APIXABAN 5 MG PO TABS
5.0000 mg | ORAL_TABLET | Freq: Two times a day (BID) | ORAL | 1 refills | Status: DC
Start: 2022-12-23 — End: 2023-06-17

## 2022-12-23 NOTE — Telephone Encounter (Signed)
Prescription refill request for Eliquis received. Indication: AF Last office visit: 12/03/22  Polly Cobia PA-C Scr: 1.04 on 11/09/22  Epic Age: 82 Weight: 107kg  Based on above findings Eliquis 5mg  twice daily is the appropriate dose.  Refill approved.

## 2022-12-23 NOTE — Telephone Encounter (Signed)
Pt's medication was sent to pt's pharmacy as requested. Confirmation received.  °

## 2022-12-23 NOTE — Telephone Encounter (Signed)
*  STAT* If patient is at the pharmacy, call can be transferred to refill team.   1. Which medications need to be refilled? (please list name of each medication and dose if known)   furosemide (LASIX) 40 MG tablet    apixaban (ELIQUIS) 5 MG TABS tablet    2. Which pharmacy/location (including street and city if local pharmacy) is medication to be sent to?  [facility] - [place], [place] - [address] [place] [address]    3. Do they need a 30 day or 90 day supply? 90

## 2023-01-04 NOTE — Progress Notes (Signed)
Carelink Summary Report / Loop Recorder 

## 2023-01-20 LAB — CUP PACEART REMOTE DEVICE CHECK
Date Time Interrogation Session: 20241102231518
Implantable Pulse Generator Implant Date: 20210810

## 2023-01-25 ENCOUNTER — Ambulatory Visit (INDEPENDENT_AMBULATORY_CARE_PROVIDER_SITE_OTHER): Payer: Medicare Other

## 2023-01-25 DIAGNOSIS — I4819 Other persistent atrial fibrillation: Secondary | ICD-10-CM | POA: Diagnosis not present

## 2023-02-18 NOTE — Progress Notes (Signed)
Carelink Summary Report / Loop Recorder 

## 2023-02-19 ENCOUNTER — Ambulatory Visit (INDEPENDENT_AMBULATORY_CARE_PROVIDER_SITE_OTHER): Payer: Medicare Other

## 2023-02-19 DIAGNOSIS — I441 Atrioventricular block, second degree: Secondary | ICD-10-CM

## 2023-02-19 LAB — CUP PACEART REMOTE DEVICE CHECK
Date Time Interrogation Session: 20241205222544
Implantable Pulse Generator Implant Date: 20210810

## 2023-02-26 NOTE — Progress Notes (Unsigned)
   Electrophysiology Office Note:   Date:  02/26/2023  ID:  Austin Taylor, DOB 01-13-1941, MRN [id number]  Primary Cardiologist: Dietrich Pates, MD Electrophysiologist: Regan Lemming, MD  {Click to update primary MD,subspecialty MD or APP then REFRESH:1}    History of Present Illness:   Austin Taylor is a 82 y.o. male with h/o OSA on CPAP, HLD, persistent atrial fib, CVA 2015, atrial fib ablation in 2018, hx Mobitz I, new ILR placed 2021, and chronic diastolic CHF seen today for routine electrophysiology followup.   Since last being seen in our clinic the patient reports doing ***.  he denies chest pain, palpitations, dyspnea, PND, orthopnea, nausea, vomiting, dizziness, syncope, edema, weight gain, or early satiety.   Review of systems complete and found to be negative unless listed in HPI.   Device History: Medtronic loop recorder implanted 04/2016, re-implant 10/24/2019 for management of atrial fibrillation   Studies Reviewed:    EKG is not ordered today. EKG from 11/09/2022 reviewed which showed SB at 52 bpm, with 1st degree AVB  Echo 11/10/2022 LVEF 70-75%, normal RV, trivial MR    Physical Exam:   VS:  There were no vitals taken for this visit.   Wt Readings from Last 3 Encounters:  12/03/22 236 lb (107 kg)  11/10/22 229 lb 15 oz (104.3 kg)  02/02/22 238 lb (108 kg)     GEN: Well nourished, well developed in no acute distress NECK: No JVD; No carotid bruits CARDIAC: {EPRHYTHM:28826}, no murmurs, rubs, gallops RESPIRATORY:  Clear to auscultation without rales, wheezing or rhonchi  ABDOMEN: Soft, non-tender, non-distended EXTREMITIES:  No edema; No deformity   ILR Interrogation- reviewed in detail today,  See PACEART report  ASSESSMENT AND PLAN:    Atrial fibrillation s/p Medtronic Loop recorder Normal device function See Pace Art report No changes today   Mobitz 1 second degree AV block Stable, asymptomatic Recent episode of lightheadedness sounded  vagal Loop previously adjusted for HRs < 40 for 8 beats and Pauses > 4.5 seconds   OSA  Encouraged nightly CPAP   HLD Continue statin   {Click here to Review PMH, Prob List, Meds, Allergies, SHx, FHx  :1}   Follow up with {WUJWJ:19147} {EPFOLLOW WG:95621}  Signed, Graciella Freer, PA-C

## 2023-03-01 ENCOUNTER — Ambulatory Visit: Payer: Medicare Other | Attending: Student | Admitting: Student

## 2023-03-01 ENCOUNTER — Ambulatory Visit: Payer: Medicare Other

## 2023-03-01 ENCOUNTER — Encounter: Payer: Self-pay | Admitting: Student

## 2023-03-01 VITALS — BP 108/70 | HR 57 | Ht 71.0 in | Wt 236.6 lb

## 2023-03-01 DIAGNOSIS — I4819 Other persistent atrial fibrillation: Secondary | ICD-10-CM | POA: Diagnosis not present

## 2023-03-01 DIAGNOSIS — E785 Hyperlipidemia, unspecified: Secondary | ICD-10-CM

## 2023-03-01 DIAGNOSIS — G4733 Obstructive sleep apnea (adult) (pediatric): Secondary | ICD-10-CM

## 2023-03-01 DIAGNOSIS — I441 Atrioventricular block, second degree: Secondary | ICD-10-CM | POA: Diagnosis not present

## 2023-03-01 LAB — CBC

## 2023-03-01 MED ORDER — SIMVASTATIN 40 MG PO TABS: 40.0000 mg | ORAL_TABLET | Freq: Every evening | ORAL | 3 refills | Status: AC

## 2023-03-01 NOTE — Patient Instructions (Signed)
Medication Instructions:  Your physician recommends that you continue on your current medications as directed. Please refer to the Current Medication list given to you today.  *If you need a refill on your cardiac medications before your next appointment, please call your pharmacy*  Lab Work: BMET, CBC-TODAY If you have labs (blood work) drawn today and your tests are completely normal, you will receive your results only by: MyChart Message (if you have MyChart) OR A paper copy in the mail If you have any lab test that is abnormal or we need to change your treatment, we will call you to review the results.  Follow-Up: At [facility], you and your health needs are our priority.  As part of our continuing mission to provide you with exceptional heart care, we have created designated Provider Care Teams.  These Care Teams include your primary Cardiologist (physician) and Advanced Practice Providers (APPs -  Physician Assistants and Nurse Practitioners) who all work together to provide you with the care you need, when you need it.  Your next appointment:   1 year with Dr Elberta Fortis  6 months with Dr Anne Fu

## 2023-03-02 LAB — BASIC METABOLIC PANEL
BUN/Creatinine Ratio: 13 (ref 10–24)
BUN: 13 mg/dL (ref 8–27)
CO2: 24 mmol/L (ref 20–29)
Calcium: 9 mg/dL (ref 8.6–10.2)
Chloride: 103 mmol/L (ref 96–106)
Creatinine, Ser: 0.97 mg/dL (ref 0.76–1.27)
Glucose: 101 mg/dL — ABNORMAL HIGH (ref 70–99)
Potassium: 4.2 mmol/L (ref 3.5–5.2)
Sodium: 142 mmol/L (ref 134–144)
eGFR: 77 mL/min/{1.73_m2} (ref >59)

## 2023-03-02 LAB — CBC
Hematocrit: 46.7 % (ref 37.5–51.0)
Hemoglobin: 15.3 g/dL (ref 13.0–17.7)
MCH: 30.4 pg (ref 26.6–33.0)
MCHC: 32.8 g/dL (ref 31.5–35.7)
MCV: 93 fL (ref 79–97)
Platelets: 247 10*3/uL (ref 150–450)
RBC: 5.04 x10E6/uL (ref 4.14–5.80)
RDW: 12.8 % (ref 11.6–15.4)
WBC: 7.6 10*3/uL (ref 3.4–10.8)

## 2023-03-03 ENCOUNTER — Encounter: Payer: Medicare Other | Admitting: Cardiology

## 2023-03-26 ENCOUNTER — Ambulatory Visit: Payer: Medicare Other

## 2023-03-26 DIAGNOSIS — I441 Atrioventricular block, second degree: Secondary | ICD-10-CM | POA: Diagnosis not present

## 2023-03-26 LAB — CUP PACEART REMOTE DEVICE CHECK
Date Time Interrogation Session: 20250109233044
Implantable Pulse Generator Implant Date: 20210810

## 2023-04-05 ENCOUNTER — Ambulatory Visit: Payer: Medicare Other

## 2023-04-30 ENCOUNTER — Ambulatory Visit (INDEPENDENT_AMBULATORY_CARE_PROVIDER_SITE_OTHER): Payer: Medicare Other

## 2023-04-30 ENCOUNTER — Ambulatory Visit: Payer: Medicare Other

## 2023-04-30 DIAGNOSIS — I441 Atrioventricular block, second degree: Secondary | ICD-10-CM

## 2023-04-30 LAB — CUP PACEART REMOTE DEVICE CHECK
Date Time Interrogation Session: 20250213231652
Implantable Pulse Generator Implant Date: 20210810

## 2023-04-30 NOTE — Progress Notes (Signed)
Carelink Summary Report / Loop Recorder

## 2023-05-03 ENCOUNTER — Telehealth: Payer: Self-pay

## 2023-05-03 NOTE — Telephone Encounter (Signed)
ILR @ RRT   ILR reached RRT:  Patient called, discussed options to leave device in or explanted.  Patient would like to explant  Advised if further questions arise to please call the device clinic at [phone].

## 2023-05-03 NOTE — Telephone Encounter (Signed)
Alert remote transmission: ERI ERI on ILR reached on 04/27/23.

## 2023-05-10 ENCOUNTER — Ambulatory Visit: Payer: Medicare Other

## 2023-05-31 NOTE — Progress Notes (Signed)
 Carelink Summary Report / Loop Recorder

## 2023-06-04 ENCOUNTER — Ambulatory Visit: Payer: Medicare Other

## 2023-06-14 ENCOUNTER — Ambulatory Visit: Payer: Medicare Other

## 2023-06-17 ENCOUNTER — Other Ambulatory Visit: Payer: Self-pay | Admitting: Internal Medicine

## 2023-06-17 DIAGNOSIS — E785 Hyperlipidemia, unspecified: Secondary | ICD-10-CM

## 2023-06-17 DIAGNOSIS — I480 Paroxysmal atrial fibrillation: Secondary | ICD-10-CM

## 2023-06-17 DIAGNOSIS — I5033 Acute on chronic diastolic (congestive) heart failure: Secondary | ICD-10-CM

## 2023-06-17 NOTE — Telephone Encounter (Signed)
 Prescription refill request for Eliquis received. Indication: Afib  Last office visit:03/01/23 ([place])  Scr: 0.97 ( 03/01/23)  Age: 83 Weight: 107.3kg  Appropriate dose. Refill sent.

## 2023-06-22 DIAGNOSIS — Z85828 Personal history of other malignant neoplasm of skin: Secondary | ICD-10-CM | POA: Diagnosis not present

## 2023-06-22 DIAGNOSIS — L821 Other seborrheic keratosis: Secondary | ICD-10-CM | POA: Diagnosis not present

## 2023-06-22 DIAGNOSIS — L988 Other specified disorders of the skin and subcutaneous tissue: Secondary | ICD-10-CM | POA: Diagnosis not present

## 2023-06-22 DIAGNOSIS — L814 Other melanin hyperpigmentation: Secondary | ICD-10-CM | POA: Diagnosis not present

## 2023-06-22 DIAGNOSIS — D0421 Carcinoma in situ of skin of right ear and external auricular canal: Secondary | ICD-10-CM | POA: Diagnosis not present

## 2023-06-22 DIAGNOSIS — D485 Neoplasm of uncertain behavior of skin: Secondary | ICD-10-CM | POA: Diagnosis not present

## 2023-06-22 DIAGNOSIS — L570 Actinic keratosis: Secondary | ICD-10-CM | POA: Diagnosis not present

## 2023-06-28 ENCOUNTER — Encounter: Payer: Self-pay | Admitting: Cardiology

## 2023-06-28 ENCOUNTER — Ambulatory Visit: Payer: Medicare Other | Attending: Cardiology | Admitting: Cardiology

## 2023-06-28 VITALS — BP 140/82 | HR 56 | Ht 71.0 in | Wt 233.0 lb

## 2023-06-28 DIAGNOSIS — I441 Atrioventricular block, second degree: Secondary | ICD-10-CM | POA: Diagnosis not present

## 2023-06-28 DIAGNOSIS — D6869 Other thrombophilia: Secondary | ICD-10-CM | POA: Diagnosis not present

## 2023-06-28 DIAGNOSIS — I4819 Other persistent atrial fibrillation: Secondary | ICD-10-CM | POA: Diagnosis not present

## 2023-06-28 NOTE — Progress Notes (Signed)
   Electrophysiology Office Note:   Date:  06/28/2023  ID:  Austin Taylor, DOB 1940/08/14, MRN [id number]  Primary Cardiologist: Dietrich Pates, MD Primary Heart Failure: None Electrophysiologist: Austin Vogl Jorja Loa, MD      History of Present Illness:   Austin Taylor is a 83 y.o. male with h/o sleep apnea, hyperlipidemia, atrial fibrillation, CVA seen today for routine electrophysiology followup.   Since last being seen in our clinic the patient reports doing well.  He is able to do his daily activities.  He has no chest pain.  He continues to be able to do his daily activities.  He mows the lawn.  After mowing the lawn and walking up a hill, he gets short of breath, but otherwise has no acute symptoms.  Both him and his wife feel that he is doing well.  He has noted no further episodes of atrial fibrillation.  Review of ILR interrogations and remotes show a low burden, less than 2%.  he denies chest pain, palpitations, dyspnea, PND, orthopnea, nausea, vomiting, dizziness, syncope, edema, weight gain, or early satiety.   Review of systems complete and found to be negative unless listed in HPI.   Device History: Medtronic loop recorder implanted 10/24/2019 for Atrial fibrillation  EP Information / Studies Reviewed:    EKG is not ordered today. EKG from 03/01/23 reviewed which showed sinus rhythm, 1dAVB        Risk Assessment/Calculations:    CHA2DS2-VASc Score = 4   This indicates a 4.8% annual risk of stroke. The patient's score is based upon: CHF History: 0 HTN History: 0 Diabetes History: 0 Stroke History: 2 Vascular Disease History: 0 Age Score: 2 Gender Score: 0            Physical Exam:   VS:  BP (!) 140/82 (BP Location: Left Arm, Patient Position: Sitting, Cuff Size: Large)   Pulse (!) 56   Ht 5\' 11"  (1.803 m)   Wt 233 lb (105.7 kg)   SpO2 97%   BMI 32.50 kg/m    Wt Readings from Last 3 Encounters:  06/28/23 233 lb (105.7 kg)  03/01/23 236 lb 9.6 oz (107.3  kg)  12/03/22 236 lb (107 kg)     GEN: Well nourished, well developed in no acute distress NECK: No JVD; No carotid bruits CARDIAC: Regular rate and rhythm, no murmurs, rubs, gallops RESPIRATORY:  Clear to auscultation without rales, wheezing or rhonchi  ABDOMEN: Soft, non-tender, non-distended EXTREMITIES:  No edema; No deformity   ILR Interrogation- reviewed in detail today,  See PACEART report  ASSESSMENT AND PLAN:    Atrial fibrillation s/p Medtronic Loop recorder Normal device function See Pace Art report Burden on device interrogation.  Patient's device is at [facility].  He is elected to not explant the device.  He Austin Taylor call back if he wishes to explant.  He may want reimplantation.  2.  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  3.  Obstructive sleep apnea: CPAP compliance encouraged  4.  Hyperlipidemia: Continue statin  5.  Secondary hypercoagulable state: Currently on Eliquis for atrial fibrillation  Secondary hypercoagulable state: Currently on Eliquis for atrial fibrillation    Follow up with Afib Clinic in 6 months  Signed, Micajah Dennin Jorja Loa, MD

## 2023-06-28 NOTE — Patient Instructions (Addendum)
 Medication Instructions:  Your physician recommends that you continue on your current medications as directed. Please refer to the Current Medication list given to you today.  *If you need a refill on your cardiac medications before your next appointment, please call your pharmacy*  Lab Work: None ordered.  If you have labs (blood work) drawn today and your tests are completely normal, you will receive your results only by: MyChart Message (if you have MyChart) OR A paper copy in the mail If you have any lab test that is abnormal or we need to change your treatment, we will call you to review the results.  Testing/Procedures: None ordered.   Follow-Up: At [facility], you and your health needs are our priority.  As part of our continuing mission to provide you with exceptional heart care, our providers are all part of one team.  This team includes your primary Cardiologist (physician) and Advanced Practice Providers or APPs (Physician Assistants and Nurse Practitioners) who all work together to provide you with the care you need, when you need it.  Your next appointment:   6 months in Afib Clinic      1st Floor: - Lobby - Registration  - Pharmacy  - Lab - Cafe  2nd Floor: - PV Lab - Diagnostic Testing (echo, CT, nuclear med)  3rd Floor: - Vacant  4th Floor: - TCTS (cardiothoracic surgery) - AFib Clinic - Structural Heart Clinic - Vascular Surgery  - Vascular Ultrasound  5th Floor: - HeartCare Cardiology (general and EP) - Clinical Pharmacy for coumadin, hypertension, lipid, weight-loss medications, and med management appointments    Valet parking services will be available as well.

## 2023-07-09 ENCOUNTER — Ambulatory Visit: Payer: Medicare Other

## 2023-07-09 DIAGNOSIS — D0421 Carcinoma in situ of skin of right ear and external auricular canal: Secondary | ICD-10-CM | POA: Diagnosis not present

## 2023-07-19 ENCOUNTER — Ambulatory Visit: Payer: Medicare Other

## 2023-08-11 DIAGNOSIS — Z0000 Encounter for general adult medical examination without abnormal findings: Secondary | ICD-10-CM | POA: Diagnosis not present

## 2023-08-11 DIAGNOSIS — K219 Gastro-esophageal reflux disease without esophagitis: Secondary | ICD-10-CM | POA: Diagnosis not present

## 2023-08-11 DIAGNOSIS — Z23 Encounter for immunization: Secondary | ICD-10-CM | POA: Diagnosis not present

## 2023-08-11 DIAGNOSIS — D509 Iron deficiency anemia, unspecified: Secondary | ICD-10-CM | POA: Diagnosis not present

## 2023-08-11 DIAGNOSIS — G4733 Obstructive sleep apnea (adult) (pediatric): Secondary | ICD-10-CM | POA: Diagnosis not present

## 2023-08-11 DIAGNOSIS — I480 Paroxysmal atrial fibrillation: Secondary | ICD-10-CM | POA: Diagnosis not present

## 2023-08-11 DIAGNOSIS — I509 Heart failure, unspecified: Secondary | ICD-10-CM | POA: Diagnosis not present

## 2023-08-11 DIAGNOSIS — I110 Hypertensive heart disease with heart failure: Secondary | ICD-10-CM | POA: Diagnosis not present

## 2023-08-11 DIAGNOSIS — D6869 Other thrombophilia: Secondary | ICD-10-CM | POA: Diagnosis not present

## 2023-08-11 DIAGNOSIS — E7800 Pure hypercholesterolemia, unspecified: Secondary | ICD-10-CM | POA: Diagnosis not present

## 2023-08-13 ENCOUNTER — Ambulatory Visit: Payer: Medicare Other

## 2023-09-16 ENCOUNTER — Encounter (HOSPITAL_COMMUNITY): Payer: Self-pay

## 2023-09-16 ENCOUNTER — Emergency Department (HOSPITAL_COMMUNITY)

## 2023-09-16 ENCOUNTER — Inpatient Hospital Stay (HOSPITAL_COMMUNITY)
Admission: EM | Admit: 2023-09-16 | Discharge: 2023-09-19 | DRG: 381 | Disposition: A | Discharge status: Home / Self Care | Attending: Internal Medicine | Admitting: Internal Medicine

## 2023-09-16 ENCOUNTER — Other Ambulatory Visit: Payer: Self-pay

## 2023-09-16 DIAGNOSIS — R6889 Other general symptoms and signs: Secondary | ICD-10-CM | POA: Diagnosis not present

## 2023-09-16 DIAGNOSIS — Z8673 Personal history of transient ischemic attack (TIA), and cerebral infarction without residual deficits: Secondary | ICD-10-CM | POA: Diagnosis not present

## 2023-09-16 DIAGNOSIS — Z4682 Encounter for fitting and adjustment of non-vascular catheter: Secondary | ICD-10-CM | POA: Diagnosis not present

## 2023-09-16 DIAGNOSIS — G4733 Obstructive sleep apnea (adult) (pediatric): Secondary | ICD-10-CM | POA: Diagnosis present

## 2023-09-16 DIAGNOSIS — K8020 Calculus of gallbladder without cholecystitis without obstruction: Secondary | ICD-10-CM | POA: Diagnosis not present

## 2023-09-16 DIAGNOSIS — I482 Chronic atrial fibrillation, unspecified: Secondary | ICD-10-CM | POA: Diagnosis present

## 2023-09-16 DIAGNOSIS — R109 Unspecified abdominal pain: Secondary | ICD-10-CM | POA: Diagnosis not present

## 2023-09-16 DIAGNOSIS — Z8249 Family history of ischemic heart disease and other diseases of the circulatory system: Secondary | ICD-10-CM | POA: Diagnosis not present

## 2023-09-16 DIAGNOSIS — R112 Nausea with vomiting, unspecified: Secondary | ICD-10-CM | POA: Diagnosis not present

## 2023-09-16 DIAGNOSIS — Z885 Allergy status to narcotic agent status: Secondary | ICD-10-CM

## 2023-09-16 DIAGNOSIS — R110 Nausea: Secondary | ICD-10-CM

## 2023-09-16 DIAGNOSIS — I700 Atherosclerosis of aorta: Secondary | ICD-10-CM | POA: Diagnosis not present

## 2023-09-16 DIAGNOSIS — N179 Acute kidney failure, unspecified: Secondary | ICD-10-CM | POA: Diagnosis not present

## 2023-09-16 DIAGNOSIS — K5730 Diverticulosis of large intestine without perforation or abscess without bleeding: Secondary | ICD-10-CM | POA: Diagnosis present

## 2023-09-16 DIAGNOSIS — N182 Chronic kidney disease, stage 2 (mild): Secondary | ICD-10-CM | POA: Diagnosis present

## 2023-09-16 DIAGNOSIS — D72829 Elevated white blood cell count, unspecified: Secondary | ICD-10-CM | POA: Diagnosis present

## 2023-09-16 DIAGNOSIS — I5032 Chronic diastolic (congestive) heart failure: Secondary | ICD-10-CM | POA: Diagnosis not present

## 2023-09-16 DIAGNOSIS — R918 Other nonspecific abnormal finding of lung field: Secondary | ICD-10-CM | POA: Diagnosis not present

## 2023-09-16 DIAGNOSIS — R1013 Epigastric pain: Secondary | ICD-10-CM | POA: Diagnosis not present

## 2023-09-16 DIAGNOSIS — I4819 Other persistent atrial fibrillation: Secondary | ICD-10-CM | POA: Diagnosis not present

## 2023-09-16 DIAGNOSIS — Z7901 Long term (current) use of anticoagulants: Secondary | ICD-10-CM

## 2023-09-16 DIAGNOSIS — R079 Chest pain, unspecified: Secondary | ICD-10-CM | POA: Diagnosis not present

## 2023-09-16 DIAGNOSIS — E785 Hyperlipidemia, unspecified: Secondary | ICD-10-CM | POA: Diagnosis present

## 2023-09-16 DIAGNOSIS — K449 Diaphragmatic hernia without obstruction or gangrene: Secondary | ICD-10-CM | POA: Diagnosis not present

## 2023-09-16 DIAGNOSIS — Z79899 Other long term (current) drug therapy: Secondary | ICD-10-CM | POA: Diagnosis not present

## 2023-09-16 DIAGNOSIS — R231 Pallor: Secondary | ICD-10-CM | POA: Diagnosis not present

## 2023-09-16 DIAGNOSIS — J9811 Atelectasis: Secondary | ICD-10-CM | POA: Diagnosis not present

## 2023-09-16 DIAGNOSIS — K440 Diaphragmatic hernia with obstruction, without gangrene: Principal | ICD-10-CM | POA: Diagnosis present

## 2023-09-16 DIAGNOSIS — Z6832 Body mass index (BMI) 32.0-32.9, adult: Secondary | ICD-10-CM

## 2023-09-16 DIAGNOSIS — K311 Adult hypertrophic pyloric stenosis: Secondary | ICD-10-CM | POA: Diagnosis not present

## 2023-09-16 DIAGNOSIS — Z87891 Personal history of nicotine dependence: Secondary | ICD-10-CM

## 2023-09-16 DIAGNOSIS — E669 Obesity, unspecified: Secondary | ICD-10-CM | POA: Diagnosis present

## 2023-09-16 DIAGNOSIS — K219 Gastro-esophageal reflux disease without esophagitis: Secondary | ICD-10-CM | POA: Diagnosis not present

## 2023-09-16 DIAGNOSIS — R0789 Other chest pain: Secondary | ICD-10-CM | POA: Diagnosis not present

## 2023-09-16 LAB — CBC
HCT: 44.1 % (ref 39.0–52.0)
Hemoglobin: 14.8 g/dL (ref 13.0–17.0)
MCH: 31 pg (ref 26.0–34.0)
MCHC: 33.6 g/dL (ref 30.0–36.0)
MCV: 92.3 fL (ref 80.0–100.0)
Platelets: 242 10*3/uL (ref 150–400)
RBC: 4.78 MIL/uL (ref 4.22–5.81)
RDW: 13.4 % (ref 11.5–15.5)
WBC: 11.6 10*3/uL — ABNORMAL HIGH (ref 4.0–10.5)
nRBC: 0 % (ref 0.0–0.2)

## 2023-09-16 LAB — COMPREHENSIVE METABOLIC PANEL WITH GFR
ALT: 17 U/L (ref 0–44)
AST: 25 U/L (ref 15–41)
Albumin: 3.7 g/dL (ref 3.5–5.0)
Alkaline Phosphatase: 59 U/L (ref 38–126)
Anion gap: 10 (ref 5–15)
BUN: 16 mg/dL (ref 8–23)
CO2: 27 mmol/L (ref 22–32)
Calcium: 8.8 mg/dL — ABNORMAL LOW (ref 8.9–10.3)
Chloride: 104 mmol/L (ref 98–111)
Creatinine, Ser: 1.3 mg/dL — ABNORMAL HIGH (ref 0.61–1.24)
GFR, Estimated: 54 mL/min — ABNORMAL LOW (ref >60)
Glucose, Bld: 200 mg/dL — ABNORMAL HIGH (ref 70–99)
Potassium: 3.9 mmol/L (ref 3.5–5.1)
Sodium: 141 mmol/L (ref 135–145)
Total Bilirubin: 0.9 mg/dL (ref 0.0–1.2)
Total Protein: 6.7 g/dL (ref 6.5–8.1)

## 2023-09-16 LAB — LIPASE, BLOOD: Lipase: 29 U/L (ref 11–51)

## 2023-09-16 LAB — TROPONIN I (HIGH SENSITIVITY)
Troponin I (High Sensitivity): 4 ng/L (ref <18)
Troponin I (High Sensitivity): 6 ng/L (ref <18)

## 2023-09-16 MED ORDER — HYDROMORPHONE HCL 1 MG/ML IJ SOLN
0.5000 mg | Freq: Once | INTRAMUSCULAR | Status: AC
  Administered 2023-09-16: 0.5 mg via INTRAVENOUS
  Filled 2023-09-16: qty 1

## 2023-09-16 MED ORDER — ONDANSETRON HCL 4 MG/2ML IJ SOLN
4.0000 mg | Freq: Once | INTRAMUSCULAR | Status: AC
  Administered 2023-09-16: 4 mg via INTRAVENOUS
  Filled 2023-09-16: qty 2

## 2023-09-16 MED ORDER — IOHEXOL 350 MG/ML SOLN
75.0000 mL | Freq: Once | INTRAVENOUS | Status: AC | PRN
  Administered 2023-09-16: 75 mL via INTRAVENOUS

## 2023-09-16 NOTE — ED Triage Notes (Signed)
 Pt presents to ED from home with CP and abdominal at 1330. Hx of hernia and GERD. Pale, cool, clammy. Nausea with one episode of vomiting. No zofran  given due to prolonged QT. Dropping P waves and beats occasionally on EKG per medic. 1 nitroglycerin given, no aspirin . Cardiac hx of afib.

## 2023-09-16 NOTE — ED Provider Notes (Signed)
 [place] EMERGENCY DEPARTMENT AT [facility] Provider Note   CSN: [id number] Arrival date & time: 09/16/23  1732     Patient presents with: Abdominal Pain and Chest Pain   Austin Taylor is a 83 y.o. male.   83 year old male with a history of atrial fibrillation on Eliquis , Mobitz type I heart block, and hiatal hernia repair who presents emergency department with epigastric abdominal pain and nausea.  Started just prior to arrival.  Also got diaphoretic and blood pressure got elevated.  Nauseous with 1 episode of vomiting.  Per EMS was given nitroglycerin.  Says that the epigastric abdominal pain and nausea have largely subsided.  No abdominal surgeries aside from hiatal hernia repair.  No history of stents or MI.  Last took his Eliquis  this morning       Prior to Admission medications   Medication Sig Start Date End Date Taking? Authorizing Provider  acetaminophen  (TYLENOL ) 650 MG CR tablet Take 1,300 mg by mouth as needed for pain.   Yes [provider]  ELIQUIS  5 MG TABS tablet TAKE 1 TABLET BY MOUTH TWICE DAILY 06/17/23  Yes Ross, Paula V, MD  ferrous sulfate 325 (65 FE) MG tablet Take 325 mg by mouth every Monday, Wednesday, and Friday.    Yes [provider]  furosemide  (LASIX ) 40 MG tablet Take 1 tablet (40 mg total) by mouth daily. 12/23/22  Yes Lesia Ozell Barter, PA-C  Probiotic Product (PROBIOTIC PO) Take 1 tablet by mouth every evening.   Yes [provider]  sildenafil (VIAGRA) 100 MG tablet Take 25-50 mg by mouth as needed for erectile dysfunction.   Yes [provider]  simvastatin  (ZOCOR ) 40 MG tablet Take 1 tablet (40 mg total) by mouth every evening. 03/01/23  Yes Lesia Ozell Barter, PA-C    Allergies: Codeine    Review of Systems  Updated Vital Signs BP (!) 140/82   Pulse 74   Temp 97.7 F (36.5 C) (Oral)   Resp (!) 21   Ht 5' 11 (1.803 m)   Wt 104.3 kg   SpO2 94%   BMI 32.08 kg/m   Physical  Exam Vitals and nursing note reviewed.  Constitutional:      General: He is not in acute distress.    Appearance: He is well-developed.  HENT:     Head: Normocephalic and atraumatic.     Right Ear: External ear normal.     Left Ear: External ear normal.     Nose: Nose normal.  Eyes:     Extraocular Movements: Extraocular movements intact.     Conjunctiva/sclera: Conjunctivae normal.     Pupils: Pupils are equal, round, and reactive to light.  Cardiovascular:     Rate and Rhythm: Normal rate and regular rhythm.     Heart sounds: Normal heart sounds.     Comments: Radial pulses 2+ bilaterally Pulmonary:     Effort: Pulmonary effort is normal. No respiratory distress.     Breath sounds: Normal breath sounds.  Abdominal:     General: There is no distension.     Palpations: Abdomen is soft. There is no mass.     Tenderness: There is abdominal tenderness in the epigastric area. There is no guarding.  Musculoskeletal:     Cervical back: Normal range of motion and neck supple.     Right lower leg: No edema.     Left lower leg: No edema.  Skin:    General: Skin is warm and  dry.  Neurological:     Mental Status: He is alert. Mental status is at baseline.  Psychiatric:        Mood and Affect: Mood normal.        Behavior: Behavior normal.     (all labs ordered are listed, but only abnormal results are displayed) Labs Reviewed  CBC - Abnormal; Notable for the following components:      Result Value   WBC 11.6 (*)    All other components within normal limits  COMPREHENSIVE METABOLIC PANEL WITH GFR - Abnormal; Notable for the following components:   Glucose, Bld 200 (*)    Creatinine, Ser 1.30 (*)    Calcium  8.8 (*)    GFR, Estimated 54 (*)    All other components within normal limits  LIPASE, BLOOD  TROPONIN I (HIGH SENSITIVITY)  TROPONIN I (HIGH SENSITIVITY)    EKG: EKG Interpretation Date/Time:  Thursday September 16 2023 17:39:56 EDT Ventricular Rate:  68 PR  Interval:  352 QRS Duration:  93 QT Interval:  458 QTC Calculation: 488 R Axis:   -66  Text Interpretation: Sinus or ectopic atrial rhythm Prolonged PR interval Left anterior fascicular block Abnormal R-wave progression, late transition Borderline prolonged QT interval Confirmed by Yolande Charleston [phone]) on 09/16/2023 5:50:33 PM  Radiology: CT Chest W Contrast Result Date: 09/16/2023 CLINICAL DATA:  Large hiatal hernia partially visualized on same day CT abdomen pelvis EXAM: CT CHEST WITH CONTRAST TECHNIQUE: Multidetector CT imaging of the chest was performed during intravenous contrast administration. RADIATION DOSE REDUCTION: This exam was performed according to the departmental dose-optimization program which includes automated exposure control, adjustment of the mA and/or kV according to patient size and/or use of iterative reconstruction technique. CONTRAST:  75mL OMNIPAQUE  IOHEXOL  350 MG/ML SOLN COMPARISON:  Same day CT abdomen pelvis FINDINGS: Cardiovascular: Normal heart size. No pericardial effusion. Coronary artery and aortic atherosclerotic calcification. Mediastinum/Nodes: Trachea is unremarkable.  No thoracic adenopathy. Large hiatal hernia containing nearly the entire stomach within the chest marked distention of the stomach with air-fluid levels. There is abrupt narrowing of the gastric antrum near the pylorus duodenal junction as it crosses the diaphragmatic hiatus (series 5/image 93). There is organoaxial rotation of the stomach without evidence of ischemia. Lungs/Pleura: Lower lobe and lingular atelectasis secondary to mass effect from the large hiatal hernia. The lungs are otherwise clear. No pleural effusion or pneumothorax. Upper Abdomen: Hepatic steatosis. Cholelithiasis. No acute abnormality. Musculoskeletal: No acute fracture. IMPRESSION: 1. Findings compatible with gastric outlet obstruction secondary to a combination of organo-axial rotation of the stomach within a large hiatal  hernia and compression of the gastric outlet as it crosses the diaphragm. 2. Aortic Atherosclerosis (ICD10-I70.0). Electronically Signed   By: Norman Gatlin M.D.   On: 09/16/2023 23:16   CT ABDOMEN PELVIS W CONTRAST Result Date: 09/16/2023 CLINICAL DATA:  epigastric abdominal pain, nausea EXAM: CT ABDOMEN AND PELVIS WITH CONTRAST TECHNIQUE: Multidetector CT imaging of the abdomen and pelvis was performed using the standard protocol following bolus administration of intravenous contrast. RADIATION DOSE REDUCTION: This exam was performed according to the departmental dose-optimization program which includes automated exposure control, adjustment of the mA and/or kV according to patient size and/or use of iterative reconstruction technique. CONTRAST:  75mL OMNIPAQUE  IOHEXOL  350 MG/ML SOLN COMPARISON:  CT 10/07/2018 FINDINGS: Lower chest: Large hiatal hernia which is only partially included in the field of view. The stomach is prominently distended with fluid with faint edema adjacent to the right lateral aspect of the  stomach. There is adjacent compressive atelectasis. Hepatobiliary: Tiny cyst in the left lobe of the liver. No suspicious liver lesion. Calcified gallstone. No pericholecystic inflammation. There is no biliary dilatation. Pancreas: No ductal dilatation or inflammation. Spleen: Normal in size without focal abnormality. Adrenals/Urinary Tract: Normal adrenal glands. No hydronephrosis. No renal calculi. There are small bilateral renal cysts. No further follow-up imaging is recommended. Partially distended urinary bladder. Stomach/Bowel: Large hiatal hernia is only partially included in the field of view. Cannot determine the stomach orientation. The herniated stomach is markedly distended with fluid. There is faint edema adjacent to the right lateral aspect of the herniated stomach. Pre pyloric stomach is decompressed. There is a tiny intramural lipoma in the pre pyloric stomach that is nonobstructing.  The duodenum is redundant. No small bowel obstruction or inflammation. There is fecalization of distal small bowel contents. The appendix is normal. Diffuse colonic diverticulosis. No colonic diverticulitis. No colonic inflammation. Vascular/Lymphatic: Aortic atherosclerosis. No aortic aneurysm. There is no portal venous or mesenteric gas. The portal vein is patent. Mild edema in the central small bowel mesentery with small mesenteric lymph nodes. No enlarged lymph nodes by size criteria. Reproductive: Prominent prostate spans 4.7 cm. Other: No free air or ascites. Fat in both inguinal canals, left greater than right. Small fat containing umbilical hernia. Musculoskeletal: There are no acute or suspicious osseous abnormalities. IMPRESSION: 1. Large hiatal hernia which is only partially included in the field of view. The herniated stomach is markedly distended with fluid. Cannot determine the stomach orientation. Findings may be related to gastric outlet obstruction. Consider chest CT for a complete gastric assessment 2. Mild edema in the central small bowel mesentery with small mesenteric lymph nodes, nonspecific. 3. Colonic diverticulosis without diverticulitis. 4. Cholelithiasis without cholecystitis. Aortic Atherosclerosis (ICD10-I70.0). Electronically Signed   By: Andrea Gasman M.D.   On: 09/16/2023 21:46   DG Chest Portable 1 View Result Date: 09/16/2023 CLINICAL DATA:  Chest pain. EXAM: PORTABLE CHEST 1 VIEW COMPARISON:  November 09, 2022 FINDINGS: The cardiac silhouette is enlarged and unchanged in size. A radiopaque loop recorder device is in place. Mild, stable, diffuse, chronic appearing increased interstitial lung markings are noted. There is stable mild to moderate severity elevation of the left hemidiaphragm with mild, stable left basilar linear scarring and/or atelectasis. No pleural effusion or pneumothorax is identified. A large hiatal hernia is noted. The visualized skeletal structures are  unremarkable. IMPRESSION: 1. Stable cardiomegaly with mild, stable left basilar linear scarring and/or atelectasis. 2. Large hiatal hernia. Electronically Signed   By: Suzen Dials M.D.   On: 09/16/2023 19:10     Procedures   Medications Ordered in the ED  ondansetron  (ZOFRAN ) injection 4 mg (4 mg Intravenous Given 09/16/23 2102)  HYDROmorphone  (DILAUDID ) injection 0.5 mg (0.5 mg Intravenous Given 09/16/23 2102)  iohexol  (OMNIPAQUE ) 350 MG/ML injection 75 mL (75 mLs Intravenous Contrast Given 09/16/23 2132)  HYDROmorphone  (DILAUDID ) injection 0.5 mg (0.5 mg Intravenous Given 09/16/23 2302)  iohexol  (OMNIPAQUE ) 350 MG/ML injection 75 mL (75 mLs Intravenous Contrast Given 09/16/23 2251)  HYDROmorphone  (DILAUDID ) injection 0.5 mg (0.5 mg Intravenous Given 09/17/23 0022)    Clinical Course as of 09/17/23 0034  Fri Sep 17, 2023  0010 Discussed with Dr. Sebastian who recommends NG tube and hospitalist admission [RP]    Clinical Course User Index [RP] Yolande Lamar BROCKS, MD  Medical Decision Making Amount and/or Complexity of Data Reviewed Labs: ordered. Radiology: ordered.  Risk Prescription drug management. Decision regarding hospitalization.   83 year old male with a history of atrial fibrillation on Eliquis , Mobitz type I heart block, and hiatal hernia repair who presents emergency department with epigastric abdominal pain and nausea.    Initial Ddx:  SBO, ileus, gastritis, pancreatitis, MI  MDM/Course:  Patient presents to the emergency department with epigastric abdominal pain and nausea and weakness.  Does have epigastric tenderness to palpation on exam.  Does have an extensive cardiac history but his cardiac workup today was reassuring.  Unfortunately was found to have what appears to be gastric outlet obstruction from his hiatal hernia.  Did have a CT scan and then CT of the chest to fully visualize the hernia.  Patient was informed of these findings.   NG tube was placed.  General surgery was consulted and patient signed out to the oncoming physician awaiting hospitalist callback for admission.   This patient presents to the ED for concern of complaints listed in HPI, this involves an extensive number of treatment options, and is a complaint that carries with it a high risk of complications and morbidity. Disposition including potential need for admission considered.   Dispo: Admit to Floor  Additional history obtained from son Records reviewed Outpatient Clinic Notes I independently reviewed the following imaging with scope of interpretation limited to determining acute life threatening conditions related to emergency care: CT Abdomen/Pelvis and agree with the radiologist interpretation with the following exceptions: none I personally reviewed and interpreted cardiac monitoring: normal sinus rhythm  I personally reviewed and interpreted the pt's EKG: see above for interpretation  I have reviewed the patients home medications and made adjustments as needed Consults: Gastroenterology and Hospitalist Social Determinants of health:  Geriatric  Portions of this note were generated with Scientist, clinical (histocompatibility and immunogenetics). Dictation errors may occur despite best attempts at proofreading.     Final diagnoses:  Gastric outlet obstruction  Hiatal hernia  Nausea    ED Discharge Orders     None          Yolande Lamar BROCKS, MD 09/17/23 [phone]

## 2023-09-16 NOTE — ED Provider Notes (Signed)
  Provider Note MRN:  [id number]  Arrival date & time: 09/16/23    ED Course and Medical Decision Making  Assumed care of patient at sign-out or upon transfer.  Gastric outlet obstruction, placing NG tube, consulting general surgery, anticipating admission.  Procedures  Final Clinical Impressions(s) / ED Diagnoses  No diagnosis found.  ED Discharge Orders     None       Discharge Instructions   None     Ozell HERO. Theadore, MD [facility] [email or web address]    Theadore Ozell HERO, MD 09/17/23 [phone]

## 2023-09-17 ENCOUNTER — Emergency Department (HOSPITAL_COMMUNITY)

## 2023-09-17 DIAGNOSIS — Z8249 Family history of ischemic heart disease and other diseases of the circulatory system: Secondary | ICD-10-CM | POA: Diagnosis not present

## 2023-09-17 DIAGNOSIS — I482 Chronic atrial fibrillation, unspecified: Secondary | ICD-10-CM | POA: Diagnosis not present

## 2023-09-17 DIAGNOSIS — Z885 Allergy status to narcotic agent status: Secondary | ICD-10-CM | POA: Diagnosis not present

## 2023-09-17 DIAGNOSIS — N182 Chronic kidney disease, stage 2 (mild): Secondary | ICD-10-CM

## 2023-09-17 DIAGNOSIS — K449 Diaphragmatic hernia without obstruction or gangrene: Secondary | ICD-10-CM

## 2023-09-17 DIAGNOSIS — D72829 Elevated white blood cell count, unspecified: Secondary | ICD-10-CM | POA: Diagnosis present

## 2023-09-17 DIAGNOSIS — K311 Adult hypertrophic pyloric stenosis: Principal | ICD-10-CM | POA: Diagnosis present

## 2023-09-17 DIAGNOSIS — Z6832 Body mass index (BMI) 32.0-32.9, adult: Secondary | ICD-10-CM | POA: Diagnosis not present

## 2023-09-17 DIAGNOSIS — Z8673 Personal history of transient ischemic attack (TIA), and cerebral infarction without residual deficits: Secondary | ICD-10-CM | POA: Diagnosis not present

## 2023-09-17 DIAGNOSIS — I4819 Other persistent atrial fibrillation: Secondary | ICD-10-CM | POA: Diagnosis not present

## 2023-09-17 DIAGNOSIS — N179 Acute kidney failure, unspecified: Secondary | ICD-10-CM | POA: Diagnosis not present

## 2023-09-17 DIAGNOSIS — Z87891 Personal history of nicotine dependence: Secondary | ICD-10-CM | POA: Diagnosis not present

## 2023-09-17 DIAGNOSIS — G4733 Obstructive sleep apnea (adult) (pediatric): Secondary | ICD-10-CM | POA: Insufficient documentation

## 2023-09-17 DIAGNOSIS — Z79899 Other long term (current) drug therapy: Secondary | ICD-10-CM | POA: Diagnosis not present

## 2023-09-17 DIAGNOSIS — J9811 Atelectasis: Secondary | ICD-10-CM | POA: Diagnosis not present

## 2023-09-17 DIAGNOSIS — K219 Gastro-esophageal reflux disease without esophagitis: Secondary | ICD-10-CM | POA: Diagnosis not present

## 2023-09-17 DIAGNOSIS — K8020 Calculus of gallbladder without cholecystitis without obstruction: Secondary | ICD-10-CM | POA: Diagnosis present

## 2023-09-17 DIAGNOSIS — Z7901 Long term (current) use of anticoagulants: Secondary | ICD-10-CM | POA: Diagnosis not present

## 2023-09-17 DIAGNOSIS — E669 Obesity, unspecified: Secondary | ICD-10-CM | POA: Diagnosis present

## 2023-09-17 DIAGNOSIS — I5032 Chronic diastolic (congestive) heart failure: Secondary | ICD-10-CM | POA: Diagnosis not present

## 2023-09-17 DIAGNOSIS — Z4682 Encounter for fitting and adjustment of non-vascular catheter: Secondary | ICD-10-CM | POA: Diagnosis not present

## 2023-09-17 DIAGNOSIS — E785 Hyperlipidemia, unspecified: Secondary | ICD-10-CM | POA: Diagnosis not present

## 2023-09-17 DIAGNOSIS — K440 Diaphragmatic hernia with obstruction, without gangrene: Secondary | ICD-10-CM | POA: Diagnosis not present

## 2023-09-17 DIAGNOSIS — K5730 Diverticulosis of large intestine without perforation or abscess without bleeding: Secondary | ICD-10-CM | POA: Diagnosis present

## 2023-09-17 LAB — CBC
HCT: 45.8 % (ref 39.0–52.0)
Hemoglobin: 15.4 g/dL (ref 13.0–17.0)
MCH: 31.4 pg (ref 26.0–34.0)
MCHC: 33.6 g/dL (ref 30.0–36.0)
MCV: 93.5 fL (ref 80.0–100.0)
Platelets: 225 K/uL (ref 150–400)
RBC: 4.9 MIL/uL (ref 4.22–5.81)
RDW: 13.5 % (ref 11.5–15.5)
WBC: 11.9 K/uL — ABNORMAL HIGH (ref 4.0–10.5)
nRBC: 0 % (ref 0.0–0.2)

## 2023-09-17 LAB — BASIC METABOLIC PANEL WITH GFR
Anion gap: 9 (ref 5–15)
BUN: 15 mg/dL (ref 8–23)
CO2: 27 mmol/L (ref 22–32)
Calcium: 8.8 mg/dL — ABNORMAL LOW (ref 8.9–10.3)
Chloride: 106 mmol/L (ref 98–111)
Creatinine, Ser: 0.85 mg/dL (ref 0.61–1.24)
GFR, Estimated: >60 mL/min (ref >60)
Glucose, Bld: 170 mg/dL — ABNORMAL HIGH (ref 70–99)
Potassium: 3.8 mmol/L (ref 3.5–5.1)
Sodium: 142 mmol/L (ref 135–145)

## 2023-09-17 LAB — MAGNESIUM: Magnesium: 2.3 mg/dL (ref 1.7–2.4)

## 2023-09-17 LAB — HEMOGLOBIN A1C
Hgb A1c MFr Bld: 6.1 % — ABNORMAL HIGH (ref 4.8–5.6)
Mean Plasma Glucose: 128.37 mg/dL

## 2023-09-17 LAB — PHOSPHORUS: Phosphorus: 3.2 mg/dL (ref 2.5–4.6)

## 2023-09-17 LAB — LACTIC ACID, PLASMA: Lactic Acid, Venous: 1.3 mmol/L (ref 0.5–1.9)

## 2023-09-17 MED ORDER — LACTATED RINGERS IV SOLN: INTRAVENOUS | Status: AC

## 2023-09-17 MED ORDER — HYDROMORPHONE HCL 1 MG/ML IJ SOLN: 0.5000 mg | INTRAMUSCULAR | Status: DC | PRN

## 2023-09-17 MED ORDER — KETOROLAC TROMETHAMINE 15 MG/ML IJ SOLN
15.0000 mg | Freq: Four times a day (QID) | INTRAMUSCULAR | Status: DC | PRN
Start: 2023-09-17 — End: 2023-09-22
  Administered 2023-09-17 – 2023-09-18 (×3): 15 mg via INTRAVENOUS
  Filled 2023-09-17 (×3): qty 1

## 2023-09-17 MED ORDER — SODIUM CHLORIDE 0.9% FLUSH
3.0000 mL | Freq: Two times a day (BID) | INTRAVENOUS | Status: DC
  Administered 2023-09-17 – 2023-09-19 (×5): 3 mL via INTRAVENOUS

## 2023-09-17 MED ORDER — HYDROMORPHONE HCL 1 MG/ML IJ SOLN
0.5000 mg | Freq: Once | INTRAMUSCULAR | Status: AC
  Administered 2023-09-17: 0.5 mg via INTRAVENOUS
  Filled 2023-09-17: qty 1

## 2023-09-17 MED ORDER — HYDROMORPHONE HCL 1 MG/ML IJ SOLN
1.0000 mg | Freq: Once | INTRAMUSCULAR | Status: AC
  Administered 2023-09-17: 1 mg via INTRAVENOUS
  Filled 2023-09-17: qty 1

## 2023-09-17 MED ORDER — PANTOPRAZOLE SODIUM 40 MG IV SOLR
40.0000 mg | INTRAVENOUS | Status: DC
  Administered 2023-09-17 – 2023-09-19 (×3): 40 mg via INTRAVENOUS
  Filled 2023-09-17 (×3): qty 10

## 2023-09-17 MED ORDER — ENOXAPARIN SODIUM 100 MG/ML IJ SOSY
100.0000 mg | PREFILLED_SYRINGE | Freq: Two times a day (BID) | INTRAMUSCULAR | Status: DC
  Administered 2023-09-17 – 2023-09-19 (×5): 100 mg via SUBCUTANEOUS
  Filled 2023-09-17 (×7): qty 1

## 2023-09-17 MED ORDER — ONDANSETRON HCL 4 MG/2ML IJ SOLN: 4.0000 mg | Freq: Four times a day (QID) | INTRAMUSCULAR | Status: DC | PRN

## 2023-09-17 MED ORDER — ONDANSETRON HCL 4 MG PO TABS
4.0000 mg | ORAL_TABLET | Freq: Four times a day (QID) | ORAL | Status: DC | PRN
Start: 2023-09-17 — End: 2023-09-19

## 2023-09-17 NOTE — Consult Note (Signed)
 Reason for Consult: Hiatal hernia Referring Physician: Dr. Jakie Elsie Austin Taylor is an 83 y.o. male.  HPI: 83 year old male with past medical history as below with a long history of hiatal hernia, at least 15 years.  He intermittently develops pressure in his chest from it and it usually eases off on its own.  It persisted today and he came to the emergency department.  Imaging reveals a large hiatal hernia.  I was asked to see him for further evaluation.  He has not had previous surgery on this hernia or his abdomen.  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   small one; denies residual on 08/25/2016    Past Surgical History:  Procedure Laterality Date   ATRIAL FIBRILLATION ABLATION  08/25/2016   ATRIAL FIBRILLATION ABLATION N/A 08/25/2016   Procedure: Atrial Fibrillation Ablation;  Surgeon: Kelsie Agent, MD;  Location: [facility]ASIVE [facility];  Service: Cardiovascular;  Laterality: N/A;   CARDIOVERSION N/A 09/09/2015   Procedure: CARDIOVERSION;  Surgeon: Vina LULLA Gull, MD;  Location: [facility] ENDOSCOPY;  Service: Cardiovascular;  Laterality: N/A;   CARDIOVERSION N/A 11/20/2015   Procedure: CARDIOVERSION;  Surgeon: Vinie JAYSON Maxcy, MD;  Location: [facility] ENDOSCOPY;  Service: Cardiovascular;  Laterality: N/A;   implantable loop recorder placement  10/24/2019   Medtronic Reveal [place] model [id number] (SN # [id number] ) implantable loop recorder implanted.  old device (RRT) removed   LOOP RECORDER INSERTION N/A 04/21/2016   Procedure: Loop Recorder Insertion;  Surgeon: Agent Kelsie, MD;  Location: [facility] INVASIVE CV LAB;  Service: Cardiovascular;  Laterality: N/A;   MOHS SURGERY     ear   SHOULDER OPEN ROTATOR CUFF REPAIR Left 1998   TEE WITHOUT CARDIOVERSION N/A  08/25/2016   Procedure: TRANSESOPHAGEAL ECHOCARDIOGRAM (TEE);  Surgeon: Jeffrie Oneil JAYSON, MD;  Location: [facility] ENDOSCOPY;  Service: Cardiovascular;  Laterality: N/A;    Family History  Problem Relation Age of Onset   Heart disease Mother    Heart disease Father    Other Father        Carotid Artery Stenosis   Heart disease Brother     Social History:  reports that he quit smoking about 49 years ago. His smoking use included cigarettes. He started smoking about 66 years ago. He has a 17 pack-year smoking history. He quit smokeless tobacco use about 48 years ago. He reports current alcohol use of about 2.0 standard drinks of alcohol per week. He reports that he does not use drugs.  Allergies:  Allergies  Allergen Reactions   Codeine Nausea Only         Medications: I have reviewed the patient's current medications.  Results for orders placed or performed during the hospital encounter of 09/16/23 (from the past 48 hours)  CBC     Status: Abnormal   Collection Time: 09/16/23  5:49 PM  Result Value Ref Range   WBC 11.6 (H) 4.0 - 10.5 K/uL   RBC 4.78 4.22 - 5.81 MIL/uL   Hemoglobin 14.8 13.0 - 17.0 g/dL   HCT 55.8 60.9 - 47.9 %   MCV 92.3 80.0 - 100.0 fL   MCH 31.0 26.0 - 34.0 pg  MCHC 33.6 30.0 - 36.0 g/dL   RDW 86.5 88.4 - 84.4 %   Platelets 242 150 - 400 K/uL   nRBC 0.0 0.0 - 0.2 %    Comment: Performed at [facility], [address] [place], [place] [address]  Troponin I (High Sensitivity)     Status: None   Collection Time: 09/16/23  5:49 PM  Result Value Ref Range   Troponin I (High Sensitivity) 4 <18 ng/L    Comment: (NOTE) Elevated high sensitivity troponin I (hsTnI) values and significant  changes across serial measurements may suggest ACS but many other  chronic and acute conditions are known to elevate hsTnI results.  Refer to the Links section for chest pain algorithms and additional  guidance. Performed at [facility], [address]  [place], [place] [address]   Comprehensive metabolic panel     Status: Abnormal   Collection Time: 09/16/23  5:49 PM  Result Value Ref Range   Sodium 141 135 - 145 mmol/L   Potassium 3.9 3.5 - 5.1 mmol/L   Chloride 104 98 - 111 mmol/L   CO2 27 22 - 32 mmol/L   Glucose, Bld 200 (H) 70 - 99 mg/dL    Comment: Glucose reference range applies only to samples taken after fasting for at least 8 hours.   BUN 16 8 - 23 mg/dL   Creatinine, Ser 8.69 (H) 0.61 - 1.24 mg/dL   Calcium  8.8 (L) 8.9 - 10.3 mg/dL   Total Protein 6.7 6.5 - 8.1 g/dL   Albumin 3.7 3.5 - 5.0 g/dL   AST 25 15 - 41 U/L   ALT 17 0 - 44 U/L   Alkaline Phosphatase 59 38 - 126 U/L   Total Bilirubin 0.9 0.0 - 1.2 mg/dL   GFR, Estimated 54 (L) >60 mL/min    Comment: (NOTE) Calculated using the CKD-EPI Creatinine Equation (2021)    Anion gap 10 5 - 15    Comment: Performed at [facility], [address] [place], [place] [address]  Lipase, blood     Status: None   Collection Time: 09/16/23  5:49 PM  Result Value Ref Range   Lipase 29 11 - 51 U/L    Comment: Performed at [facility], [address] [place], [place] [address]  Troponin I (High Sensitivity)     Status: None   Collection Time: 09/16/23  8:13 PM  Result Value Ref Range   Troponin I (High Sensitivity) 6 <18 ng/L    Comment: (NOTE) Elevated high sensitivity troponin I (hsTnI) values and significant  changes across serial measurements may suggest ACS but many other  chronic and acute conditions are known to elevate hsTnI results.  Refer to the Links section for chest pain algorithms and additional  guidance. Performed at [facility], [address], [place], [place] [address]     CT Chest W Contrast Result Date: 09/16/2023 CLINICAL DATA:  Large hiatal hernia partially visualized on same day CT abdomen pelvis EXAM: CT CHEST WITH CONTRAST TECHNIQUE: Multidetector CT imaging of the chest was performed during intravenous contrast administration.  RADIATION DOSE REDUCTION: This exam was performed according to the departmental dose-optimization program which includes automated exposure control, adjustment of the mA and/or kV according to patient size and/or use of iterative reconstruction technique. CONTRAST:  75mL OMNIPAQUE  IOHEXOL  350 MG/ML SOLN COMPARISON:  Same day CT abdomen pelvis FINDINGS: Cardiovascular: Normal heart size. No pericardial effusion. Coronary artery and  aortic atherosclerotic calcification. Mediastinum/Nodes: Trachea is unremarkable.  No thoracic adenopathy. Large hiatal hernia containing nearly the entire stomach within the chest marked distention of the stomach with air-fluid levels. There is abrupt narrowing of the gastric antrum near the pylorus duodenal junction as it crosses the diaphragmatic hiatus (series 5/image 93). There is organoaxial rotation of the stomach without evidence of ischemia. Lungs/Pleura: Lower lobe and lingular atelectasis secondary to mass effect from the large hiatal hernia. The lungs are otherwise clear. No pleural effusion or pneumothorax. Upper Abdomen: Hepatic steatosis. Cholelithiasis. No acute abnormality. Musculoskeletal: No acute fracture. IMPRESSION: 1. Findings compatible with gastric outlet obstruction secondary to a combination of organo-axial rotation of the stomach within a large hiatal hernia and compression of the gastric outlet as it crosses the diaphragm. 2. Aortic Atherosclerosis (ICD10-I70.0). Electronically Signed   By: Norman Gatlin M.D.   On: 09/16/2023 23:16   CT ABDOMEN PELVIS W CONTRAST Result Date: 09/16/2023 CLINICAL DATA:  epigastric abdominal pain, nausea EXAM: CT ABDOMEN AND PELVIS WITH CONTRAST TECHNIQUE: Multidetector CT imaging of the abdomen and pelvis was performed using the standard protocol following bolus administration of intravenous contrast. RADIATION DOSE REDUCTION: This exam was performed according to the departmental dose-optimization program which includes  automated exposure control, adjustment of the mA and/or kV according to patient size and/or use of iterative reconstruction technique. CONTRAST:  75mL OMNIPAQUE  IOHEXOL  350 MG/ML SOLN COMPARISON:  CT 10/07/2018 FINDINGS: Lower chest: Large hiatal hernia which is only partially included in the field of view. The stomach is prominently distended with fluid with faint edema adjacent to the right lateral aspect of the stomach. There is adjacent compressive atelectasis. Hepatobiliary: Tiny cyst in the left lobe of the liver. No suspicious liver lesion. Calcified gallstone. No pericholecystic inflammation. There is no biliary dilatation. Pancreas: No ductal dilatation or inflammation. Spleen: Normal in size without focal abnormality. Adrenals/Urinary Tract: Normal adrenal glands. No hydronephrosis. No renal calculi. There are small bilateral renal cysts. No further follow-up imaging is recommended. Partially distended urinary bladder. Stomach/Bowel: Large hiatal hernia is only partially included in the field of view. Cannot determine the stomach orientation. The herniated stomach is markedly distended with fluid. There is faint edema adjacent to the right lateral aspect of the herniated stomach. Pre pyloric stomach is decompressed. There is a tiny intramural lipoma in the pre pyloric stomach that is nonobstructing. The duodenum is redundant. No small bowel obstruction or inflammation. There is fecalization of distal small bowel contents. The appendix is normal. Diffuse colonic diverticulosis. No colonic diverticulitis. No colonic inflammation. Vascular/Lymphatic: Aortic atherosclerosis. No aortic aneurysm. There is no portal venous or mesenteric gas. The portal vein is patent. Mild edema in the central small bowel mesentery with small mesenteric lymph nodes. No enlarged lymph nodes by size criteria. Reproductive: Prominent prostate spans 4.7 cm. Other: No free air or ascites. Fat in both inguinal canals, left greater than  right. Small fat containing umbilical hernia. Musculoskeletal: There are no acute or suspicious osseous abnormalities. IMPRESSION: 1. Large hiatal hernia which is only partially included in the field of view. The herniated stomach is markedly distended with fluid. Cannot determine the stomach orientation. Findings may be related to gastric outlet obstruction. Consider chest CT for a complete gastric assessment 2. Mild edema in the central small bowel mesentery with small mesenteric lymph nodes, nonspecific. 3. Colonic diverticulosis without diverticulitis. 4. Cholelithiasis without cholecystitis. Aortic Atherosclerosis (ICD10-I70.0). Electronically Signed   By: Andrea Gasman M.D.   On: 09/16/2023 21:46   DG  Chest Portable 1 View Result Date: 09/16/2023 CLINICAL DATA:  Chest pain. EXAM: PORTABLE CHEST 1 VIEW COMPARISON:  November 09, 2022 FINDINGS: The cardiac silhouette is enlarged and unchanged in size. A radiopaque loop recorder device is in place. Mild, stable, diffuse, chronic appearing increased interstitial lung markings are noted. There is stable mild to moderate severity elevation of the left hemidiaphragm with mild, stable left basilar linear scarring and/or atelectasis. No pleural effusion or pneumothorax is identified. A large hiatal hernia is noted. The visualized skeletal structures are unremarkable. IMPRESSION: 1. Stable cardiomegaly with mild, stable left basilar linear scarring and/or atelectasis. 2. Large hiatal hernia. Electronically Signed   By: Suzen Dials M.D.   On: 09/16/2023 19:10    Review of Systems  Constitutional:  Positive for appetite change.  HENT: Negative.    Eyes: Negative.   Respiratory: Negative.    Cardiovascular:  Positive for chest pain.  Gastrointestinal:  Positive for abdominal pain.  Endocrine: Negative.   Genitourinary: Negative.   Musculoskeletal: Negative.   Allergic/Immunologic: Negative.   Neurological: Negative.   Hematological: Negative.    Psychiatric/Behavioral: Negative.     Blood pressure (!) 140/82, pulse 74, temperature 97.7 F (36.5 C), temperature source Oral, resp. rate (!) 21, height 5' 11 (1.803 m), weight 104.3 kg, SpO2 94%. Physical Exam HENT:     Head: Normocephalic.  Cardiovascular:     Rate and Rhythm: Normal rate and regular rhythm.  Abdominal:     General: There is no distension.     Palpations: Abdomen is soft.     Tenderness: There is no abdominal tenderness.  Skin:    General: Skin is warm.     Capillary Refill: Capillary refill takes 2 to 3 seconds.  Neurological:     Mental Status: He is alert and oriented to person, place, and time.  Psychiatric:        Mood and Affect: Mood normal.     Assessment/Plan: Large hiatal hernia -recommend NG tube placement to low intermittent suction, medical admission for IV fluids and management of his medical problems.  Hopefully his stomach will decompress and open up.  He will likely need repair of this hernia but, hopefully we can do this on a more elective basis.  We will follow closely.  I spoke with his family.  Austin Taylor 09/17/2023, 12:42 AM

## 2023-09-17 NOTE — Progress Notes (Signed)
 Feels better  Abdomen NT Keep NGT for today Will consider removal tomorrow  Keep NPO except ice

## 2023-09-17 NOTE — ED Notes (Signed)
 ATTEMPTED TO CALL TRANSPORT, TRANSPORT ADVISED THERE WAS NO ONE AVAILABLE TO TRANSPORT AT THIS TIME

## 2023-09-17 NOTE — H&P (Addendum)
 History and Physical    Austin Taylor DOB: Mar 28, 1940 DOA: 09/16/2023  DOS: the patient was seen and examined on 09/16/2023  PCP: Alben Therisa KANDICE, PA   Patient coming from: Home  I have personally briefly reviewed patient's old medical records in [facility] and CareEverywhere  HPI:   Austin Taylor is a 83 y.o. year old male with past medical history of CKD stage II, hyperlipidemia, obstructive sleep apnea, persistent atrial fibrillation s/p prior DCCV and ablation, CVA on Eliquis , first-degree AV block, chronic diastolic CHF, GERD, and hiatal hernia.  Austin Taylor presents to [facility] ED with epigastric abdominal pain and nausea that started shortly before arrival to the ED yesterday evening. He had 1 episode of vomiting and was noted to be diaphoretic and hypertensive by EMS.  Last bowel movement was yesterday midday.  He denies chest pain, palpitations, dyspnea, fever, or chills.  ED Course: On arrival to [facility] ED patient was noted to be afebrile temp 36.5 C, BP 136/74, HR 67, RR 16, SpO2 100% on room air.  At that time he was reporting 7 out of 10 epigastric pressure.  CXR obtained and shows stable cardiomegaly with left basilar scarring/atelectasis and a large hiatal hernia.  CT chest abdomen pelvis obtained and shows findings compatible with gastric outlet obstruction secondary to a combination of organo-axial rotation of the stomach within a large hiatal hernia and compression of the gastric outlet as it crosses the diaphragm, edema in central small bowel mesentery, diverticulosis, and cholelithiasis.  Labs notable for leukocytosis WBC 11.6, troponin negative 4-->6, glucose elevated 200, creatinine 1.30.  General surgery consulted by EDP and recommending NG tube to LIWS, IV fluids, medical management.  No plans for inpatient surgery at this time. [facility] contacted for admission.  Review of Systems: As mentioned in the history of present illness. All other systems  reviewed and are negative.    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   small one; denies residual on 08/25/2016    Past Surgical History:  Procedure Laterality Date   ATRIAL FIBRILLATION ABLATION  08/25/2016   ATRIAL FIBRILLATION ABLATION N/A 08/25/2016   Procedure: Atrial Fibrillation Ablation;  Surgeon: Kelsie Agent, MD;  Location: [facility] INVASIVE CV LAB;  Service: Cardiovascular;  Laterality: N/A;   CARDIOVERSION N/A 09/09/2015   Procedure: CARDIOVERSION;  Surgeon: Vina LULLA Gull, MD;  Location: [facility] ENDOSCOPY;  Service: Cardiovascular;  Laterality: N/A;   CARDIOVERSION N/A 11/20/2015   Procedure: CARDIOVERSION;  Surgeon: Vinie JAYSON Maxcy, MD;  Location: [facility] ENDOSCOPY;  Service: Cardiovascular;  Laterality: N/A;   implantable loop recorder placement  10/24/2019   Medtronic Reveal [place] model [id number] (SN # [id number] ) implantable loop recorder implanted.  old device (RRT) removed   LOOP RECORDER INSERTION N/A 04/21/2016   Procedure: Loop Recorder Insertion;  Surgeon: Agent Kelsie, MD;  Location: [facility] INVASIVE CV LAB;  Service: Cardiovascular;  Laterality: N/A;   MOHS SURGERY     ear   SHOULDER OPEN ROTATOR CUFF REPAIR Left 1998   TEE WITHOUT CARDIOVERSION N/A 08/25/2016   Procedure: TRANSESOPHAGEAL ECHOCARDIOGRAM (TEE);  Surgeon: Jeffrie Oneil JAYSON, MD;  Location: [facility] ENDOSCOPY;  Service: Cardiovascular;  Laterality: N/A;     reports that he quit smoking about 49 years ago. His smoking use included cigarettes. He started smoking about 66 years ago. He has a 17 pack-year smoking history. He quit smokeless tobacco use about 48 years ago. He reports current alcohol use of about 2.0 standard drinks of alcohol per week. He reports that he does not use  drugs.  Allergies  Allergen Reactions   Codeine Nausea Only         Family History  Problem Relation Age of Onset   Heart disease Mother    Heart disease Father    Other Father        Carotid Artery Stenosis   Heart disease Brother     Prior to Admission medications   Medication Sig Start Date End Date Taking? Authorizing Provider  acetaminophen  (TYLENOL ) 650 MG CR tablet Take 1,300 mg by mouth as needed for pain.   Yes [provider]  ELIQUIS  5 MG TABS tablet TAKE 1 TABLET BY MOUTH TWICE DAILY 06/17/23  Yes Ross, Paula V, MD  ferrous sulfate 325 (65 FE) MG tablet Take 325 mg by mouth every Monday, Wednesday, and Friday.    Yes [provider]  furosemide  (LASIX ) 40 MG tablet Take 1 tablet (40 mg total) by mouth daily. 12/23/22  Yes Lesia Ozell Barter, PA-C  Probiotic Product (PROBIOTIC PO) Take 1 tablet by mouth every evening.   Yes [provider]  sildenafil (VIAGRA) 100 MG tablet Take 25-50 mg by mouth as needed for erectile dysfunction.   Yes [provider]  simvastatin  (ZOCOR ) 40 MG tablet Take 1 tablet (40 mg total) by mouth every evening. 03/01/23  Yes Lesia Ozell Barter, PA-C    Physical Exam: Vitals:   09/17/23 0530 09/17/23 0545 09/17/23 0645 09/17/23 0727  BP:    128/76  Pulse: 77 78 78 77  Resp: 20 19 20 18   Temp:    98 F (36.7 C)  TempSrc:      SpO2: 94% 96% 95% 93%  Weight:      Height:        Constitutional: Elderly Caucasian gentleman in NAD, calm, hard of hearing  Respiratory: clear to auscultation bilaterally, no wheezing, no crackles. Normal respiratory effort. No accessory muscle use.  Cardiovascular: Regular rate and rhythm, no murmurs / rubs / gallops. No extremity edema. 2+ pedal pulses. No carotid bruits.  Abdomen: Soft, nondistended, epigastric tenderness. (+) Bowel sounds  Musculoskeletal:  No joint deformity upper and lower extremities. Good ROM, no contractures. Normal muscle tone.  Skin: Warm,  dry.  No rashes, lesions, ulcers.  Neurologic: CN 2-12 grossly intact.  Alert and oriented x 3.   Labs on Admission: I have personally reviewed following labs and imaging studies  CBC: Recent Labs  Lab 09/16/23 1749 09/17/23 0530  WBC 11.6* 11.9*  HGB 14.8 15.4  HCT 44.1 45.8  MCV 92.3 93.5  PLT 242 225   Basic Metabolic Panel: Recent Labs  Lab 09/16/23 1749 09/17/23 0530  NA 141 142  K 3.9 3.8  CL 104 106  CO2 27 27  GLUCOSE 200* 170*  BUN 16 15  CREATININE 1.30* 0.85  CALCIUM  8.8* 8.8*  MG  --  2.3  PHOS  --  3.2   GFR: Estimated Creatinine Clearance: 79.5 mL/min (by C-G formula based on SCr of 0.85 mg/dL). Liver Function Tests: Recent Labs  Lab 09/16/23 1749  AST 25  ALT 17  ALKPHOS 59  BILITOT 0.9  PROT 6.7  ALBUMIN 3.7   Recent Labs  Lab 09/16/23 1749  LIPASE 29   No results for input(s): AMMONIA in the last 168 hours. Coagulation Profile: No results for input(s): INR, PROTIME in the last 168 hours. Cardiac Enzymes: Recent Labs  Lab 09/16/23 1749 09/16/23 2013  TROPONINIHS 4 6   BNP (last 3 results) No results for input(s): BNP in the last 8760 hours. HbA1C: No results for input(s): HGBA1C in the last 72 hours. CBG: No results for input(s): GLUCAP in the last 168 hours. Lipid Profile: No results for input(s): CHOL, HDL, LDLCALC, TRIG, CHOLHDL, LDLDIRECT in the last 72 hours. Thyroid  Function Tests: No results for input(s): TSH, T4TOTAL, FREET4, T3FREE, THYROIDAB in the last 72 hours. Anemia Panel: No results for input(s): VITAMINB12, FOLATE, FERRITIN, TIBC, IRON, RETICCTPCT in the last 72 hours. Urine analysis:    Component Value Date/Time   COLORURINE YELLOW 10/07/2018 1959   APPEARANCEUR HAZY (A) 10/07/2018 1959   LABSPEC 1.029 10/07/2018 1959   PHURINE 5.0 10/07/2018 1959   GLUCOSEU NEGATIVE 10/07/2018 1959   HGBUR NEGATIVE 10/07/2018 1959   BILIRUBINUR NEGATIVE 10/07/2018 1959    KETONESUR NEGATIVE 10/07/2018 1959   PROTEINUR NEGATIVE 10/07/2018 1959   NITRITE NEGATIVE 10/07/2018 1959   LEUKOCYTESUR NEGATIVE 10/07/2018 1959    Radiological Exams on Admission: I have personally reviewed images DG Abdomen 1 View Result Date: 09/17/2023 CLINICAL DATA:  NG tube verification. EXAM: ABDOMEN - 1 VIEW COMPARISON:  September 17, 2023 (12:50 a.m.) FINDINGS: A nasogastric tube is seen with its distal end overlying the left lung base. This is overlying a very large gastric hernia. The bowel gas pattern is normal. No radio-opaque calculi or other significant radiographic abnormality are seen. IMPRESSION: Nasogastric tube likely positioned within a known very large gastric hernia. Electronically Signed   By: Suzen Dials M.D.   On: 09/17/2023 02:24   DG Abd Portable 1 View Result Date: 09/17/2023 CLINICAL DATA:  NG tube placement. EXAM: PORTABLE ABDOMEN - 1 VIEW COMPARISON:  None Available. FINDINGS: A nasogastric tube is seen with its distal end overlying the left lung base. This is likely within a known large hiatal hernia. The bowel gas pattern is normal. No radio-opaque calculi or other significant radiographic abnormality are seen. IMPRESSION: Nasogastric tube positioning, as described above, with its distal end likely positioned within a known large hiatal hernia. Electronically Signed   By: Suzen Dials M.D.   On: 09/17/2023 01:10   CT Chest W Contrast Result Date: 09/16/2023 CLINICAL DATA:  Large hiatal hernia partially visualized on same day CT abdomen pelvis EXAM: CT CHEST WITH CONTRAST TECHNIQUE: Multidetector CT imaging of the chest was performed during intravenous contrast administration. RADIATION DOSE REDUCTION: This exam was performed according to the departmental dose-optimization program which includes automated exposure control, adjustment of the mA and/or kV according to patient size and/or use of iterative reconstruction technique. CONTRAST:  75mL OMNIPAQUE  IOHEXOL  350  MG/ML SOLN COMPARISON:  Same day CT abdomen pelvis FINDINGS: Cardiovascular: Normal heart size. No pericardial effusion. Coronary artery and aortic atherosclerotic calcification. Mediastinum/Nodes: Trachea is unremarkable.  No thoracic adenopathy. Large hiatal hernia containing nearly the entire stomach within the chest marked distention of the stomach with air-fluid levels. There is abrupt narrowing of the gastric antrum near the pylorus duodenal junction as it crosses the diaphragmatic hiatus (series 5/image 93). There is organoaxial rotation of the stomach without evidence of ischemia. Lungs/Pleura: Lower lobe and lingular atelectasis secondary to mass effect from  the large hiatal hernia. The lungs are otherwise clear. No pleural effusion or pneumothorax. Upper Abdomen: Hepatic steatosis. Cholelithiasis. No acute abnormality. Musculoskeletal: No acute fracture. IMPRESSION: 1. Findings compatible with gastric outlet obstruction secondary to a combination of organo-axial rotation of the stomach within a large hiatal hernia and compression of the gastric outlet as it crosses the diaphragm. 2. Aortic Atherosclerosis (ICD10-I70.0). Electronically Signed   By: Norman Gatlin M.D.   On: 09/16/2023 23:16   CT ABDOMEN PELVIS W CONTRAST Result Date: 09/16/2023 CLINICAL DATA:  epigastric abdominal pain, nausea EXAM: CT ABDOMEN AND PELVIS WITH CONTRAST TECHNIQUE: Multidetector CT imaging of the abdomen and pelvis was performed using the standard protocol following bolus administration of intravenous contrast. RADIATION DOSE REDUCTION: This exam was performed according to the departmental dose-optimization program which includes automated exposure control, adjustment of the mA and/or kV according to patient size and/or use of iterative reconstruction technique. CONTRAST:  75mL OMNIPAQUE  IOHEXOL  350 MG/ML SOLN COMPARISON:  CT 10/07/2018 FINDINGS: Lower chest: Large hiatal hernia which is only partially included in the field  of view. The stomach is prominently distended with fluid with faint edema adjacent to the right lateral aspect of the stomach. There is adjacent compressive atelectasis. Hepatobiliary: Tiny cyst in the left lobe of the liver. No suspicious liver lesion. Calcified gallstone. No pericholecystic inflammation. There is no biliary dilatation. Pancreas: No ductal dilatation or inflammation. Spleen: Normal in size without focal abnormality. Adrenals/Urinary Tract: Normal adrenal glands. No hydronephrosis. No renal calculi. There are small bilateral renal cysts. No further follow-up imaging is recommended. Partially distended urinary bladder. Stomach/Bowel: Large hiatal hernia is only partially included in the field of view. Cannot determine the stomach orientation. The herniated stomach is markedly distended with fluid. There is faint edema adjacent to the right lateral aspect of the herniated stomach. Pre pyloric stomach is decompressed. There is a tiny intramural lipoma in the pre pyloric stomach that is nonobstructing. The duodenum is redundant. No small bowel obstruction or inflammation. There is fecalization of distal small bowel contents. The appendix is normal. Diffuse colonic diverticulosis. No colonic diverticulitis. No colonic inflammation. Vascular/Lymphatic: Aortic atherosclerosis. No aortic aneurysm. There is no portal venous or mesenteric gas. The portal vein is patent. Mild edema in the central small bowel mesentery with small mesenteric lymph nodes. No enlarged lymph nodes by size criteria. Reproductive: Prominent prostate spans 4.7 cm. Other: No free air or ascites. Fat in both inguinal canals, left greater than right. Small fat containing umbilical hernia. Musculoskeletal: There are no acute or suspicious osseous abnormalities. IMPRESSION: 1. Large hiatal hernia which is only partially included in the field of view. The herniated stomach is markedly distended with fluid. Cannot determine the stomach  orientation. Findings may be related to gastric outlet obstruction. Consider chest CT for a complete gastric assessment 2. Mild edema in the central small bowel mesentery with small mesenteric lymph nodes, nonspecific. 3. Colonic diverticulosis without diverticulitis. 4. Cholelithiasis without cholecystitis. Aortic Atherosclerosis (ICD10-I70.0). Electronically Signed   By: Andrea Gasman M.D.   On: 09/16/2023 21:46   DG Chest Portable 1 View Result Date: 09/16/2023 CLINICAL DATA:  Chest pain. EXAM: PORTABLE CHEST 1 VIEW COMPARISON:  November 09, 2022 FINDINGS: The cardiac silhouette is enlarged and unchanged in size. A radiopaque loop recorder device is in place. Mild, stable, diffuse, chronic appearing increased interstitial lung markings are noted. There is stable mild to moderate severity elevation of the left hemidiaphragm with mild, stable left basilar linear scarring and/or atelectasis. No pleural  effusion or pneumothorax is identified. A large hiatal hernia is noted. The visualized skeletal structures are unremarkable. IMPRESSION: 1. Stable cardiomegaly with mild, stable left basilar linear scarring and/or atelectasis. 2. Large hiatal hernia. Electronically Signed   By: Suzen Dials M.D.   On: 09/16/2023 19:10    EKG: My personal interpretation of EKG shows: Sinus rhythm, HR 68 with first-degree AV block, left anterior fascicular block   Assessment/Plan Principal Problem:   Gastric outlet obstruction    Active medical issues: ##Gastric Outlet Obstruction #Hiatal Hernia - Keep NPO - NGT to LIWS - MIVF at 100 mL/hr, monitor respiratory status and fluid volume status closely - Daily BMP + Mg - PRN electrolyte correction for goal K >4 and Mg >2 - Analgesia as needed - PRN antiemetics - General surgery consulted, appreciate their recommendations.   ##Acute Kidney Injury, resolved #Chronic Kidney Disease, stage II - IVF hydration as above - Hold home Lasix  - Avoid nephrotoxins,  Contrast Dyes, Hypotension and Dehydration - Continue to Monitor and Trend Renal Function carefully and repeat metabolic panel with a.m. labs  ##Leukocytosis, mild Patient is afebrile, denies fever/chills, abdomen not peritonitic, no known sick contact, no reports of dysuria.  CXR negative for infiltrates.  Likely stress response. - Check lactic acid   Chronic medical issues: #Hyperlipidemia - Continue home simvastatin  after NG tube removal  #Chronic diastolic CHF LVEF 70 to 75% in August 2024 - Hold home Lasix  - Monitor respiratory status and fluid volume status closely with continuous IV fluids and Lasix  held  #Persistent atrial fibrillation and CVA on Eliquis   - Lovenox  while NG tube in place  #Obstructive sleep apnea  On home CPAP, will not order at this time due to risk of gastric insufflation and aspiration with NG tube to suction in place -qHS supplemental O2   VTE prophylaxis: Lovenox   GI prophylaxis: Protonix  Diet: NPO sips and chips Access: PIV Lines: None Code Status:  Full Code Telemetry: No  Admission status: Inpatient, Med-Surg Patient is from: Home Anticipated d/c is to: Home Anticipated d/c date is: 2 to 3 days Patient currently: Receiving IV fluids, NG tube in place for decompression, and pending symptomatic improvement  Family Communication: Wife Austin Taylor  at bedside  Consults called: General Surgery called by EDP   Severity of Illness: The appropriate patient status for this patient is INPATIENT. Inpatient status is judged to be reasonable and necessary in order to provide the required intensity of service to ensure the patient's safety. The patient's presenting symptoms, physical exam findings, and initial radiographic and laboratory data in the context of their chronic comorbidities is felt to place them at high risk for further clinical deterioration. Furthermore, it is not anticipated that the patient will be medically stable for discharge from the  hospital within 2 midnights of admission.   * I certify that at the point of admission it is my clinical judgment that the patient will require inpatient hospital care spanning beyond 2 midnights from the point of admission due to high intensity of service, high risk for further deterioration and high frequency of surveillance required.*  To reach the provider On-Call:   7AM- 7PM see care teams to locate the attending and reach out to them via [email or web address] Password: [email or web address] 7PM-7AM contact night-coverage If you still have difficulty reaching the appropriate provider, please page the [facility] (Director on Call) for [facility] on amion for assistance  This document was prepared using Conservation officer, historic buildings and may include unintentional dictation errors.  Rockie  Amalie Koran FNP-BC, PMHNP-BC Nurse Practitioner [facility]

## 2023-09-17 NOTE — Progress Notes (Signed)
 Pharmacy Consult for Enoxaparin   Indication: atrial fibrillation  Allergies  Allergen Reactions   Codeine Nausea Only         Patient Measurements: Height: 5' 11 (180.3 cm) Weight: 104.3 kg (230 lb) IBW/kg (Calculated) : 75.3 HEPARIN  DW (KG): 97.2  Vital Signs: Temp: 98 F (36.7 C) (07/04 0727) Temp Source: Oral (07/04 0411) BP: 128/76 (07/04 0727) Pulse Rate: 77 (07/04 0727)  Labs: Recent Labs    09/16/23 1749 09/17/23 0530  HGB 14.8 15.4  HCT 44.1 45.8  PLT 242 225  CREATININE 1.30* 0.85    Estimated Creatinine Clearance: 79.5 mL/min (by C-G formula based on SCr of 0.85 mg/dL).  Assessment: Austin Taylor a 83 y.o. male presented with hiatal hernia. Patient on Eliquis  5 mg BID PTA for atrial fibrillation (LD 7/3 0600). Pharmacy has been consulted for Lovenox  dosing while unable to take Eliquis . Pt BMI 32.   CBC WNL.     Plan:  START Lovenox  100 mg [place] Q12H  F/U CBC daily  F/U Eliquis  restart/CCS recs   Massie Fila, PharmD Clinical Pharmacist  09/17/2023 7:31 AM

## 2023-09-17 NOTE — Hospital Course (Signed)
 Austin Taylor is a 83 y.o. year old male with past medical history of CKD stage II, hyperlipidemia, obstructive sleep apnea, persistent atrial fibrillation s/p prior DCCV and ablation, CVA on Eliquis , first-degree AV block, chronic diastolic CHF, GERD, and hiatal hernia.  [name] presents to [facility] ED with epigastric abdominal pain and nausea that started shortly before arrival to the ED yesterday evening. He had 1 episode of vomiting and was noted to be diaphoretic and hypertensive by EMS.  Last bowel movement was yesterday midday.  He denies chest pain, palpitations, dyspnea, fever, or chills.   7/4: General surgery consulted, recommending NGT to LIWS, IVF, conservative management

## 2023-09-17 NOTE — Plan of Care (Signed)

## 2023-09-18 DIAGNOSIS — I482 Chronic atrial fibrillation, unspecified: Secondary | ICD-10-CM | POA: Diagnosis not present

## 2023-09-18 DIAGNOSIS — N182 Chronic kidney disease, stage 2 (mild): Secondary | ICD-10-CM | POA: Diagnosis not present

## 2023-09-18 DIAGNOSIS — K311 Adult hypertrophic pyloric stenosis: Secondary | ICD-10-CM | POA: Diagnosis not present

## 2023-09-18 LAB — CBC
HCT: 39.3 % (ref 39.0–52.0)
Hemoglobin: 13.3 g/dL (ref 13.0–17.0)
MCH: 32 pg (ref 26.0–34.0)
MCHC: 33.8 g/dL (ref 30.0–36.0)
MCV: 94.5 fL (ref 80.0–100.0)
Platelets: 217 K/uL (ref 150–400)
RBC: 4.16 MIL/uL — ABNORMAL LOW (ref 4.22–5.81)
RDW: 13.8 % (ref 11.5–15.5)
WBC: 12.5 K/uL — ABNORMAL HIGH (ref 4.0–10.5)
nRBC: 0 % (ref 0.0–0.2)

## 2023-09-18 LAB — BASIC METABOLIC PANEL WITH GFR
Anion gap: 8 (ref 5–15)
BUN: 22 mg/dL (ref 8–23)
CO2: 27 mmol/L (ref 22–32)
Calcium: 8.6 mg/dL — ABNORMAL LOW (ref 8.9–10.3)
Chloride: 106 mmol/L (ref 98–111)
Creatinine, Ser: 1.13 mg/dL (ref 0.61–1.24)
GFR, Estimated: >60 mL/min (ref >60)
Glucose, Bld: 94 mg/dL (ref 70–99)
Potassium: 3.5 mmol/L (ref 3.5–5.1)
Sodium: 141 mmol/L (ref 135–145)

## 2023-09-18 LAB — MAGNESIUM: Magnesium: 2.3 mg/dL (ref 1.7–2.4)

## 2023-09-18 MED ORDER — SODIUM CHLORIDE 0.45 % IV SOLN: INTRAVENOUS | Status: AC

## 2023-09-18 MED ORDER — POTASSIUM CHLORIDE 10 MEQ/100ML IV SOLN
10.0000 meq | INTRAVENOUS | Status: AC
  Administered 2023-09-18 (×4): 10 meq via INTRAVENOUS
  Filled 2023-09-18 (×4): qty 100

## 2023-09-18 NOTE — Plan of Care (Signed)
   Problem: Activity: Goal: Risk for activity intolerance will decrease Outcome: Progressing   Problem: Safety: Goal: Ability to remain free from injury will improve Outcome: Progressing   Problem: Skin Integrity: Goal: Risk for impaired skin integrity will decrease Outcome: Progressing

## 2023-09-18 NOTE — Plan of Care (Signed)

## 2023-09-18 NOTE — Progress Notes (Signed)
 Subjective/Chief Complaint: Patient denies abdominal pain.  NG tube output decreased significantly   Objective: Vital signs in last 24 hours: Temp:  [97.7 F (36.5 C)-98.5 F (36.9 C)] 98.5 F (36.9 C) (07/05 0916) Pulse Rate:  [59-70] 70 (07/05 0916) Resp:  [15-17] 17 (07/05 0916) BP: (114-135)/(63-94) 135/77 (07/05 0916) SpO2:  [95 %-98 %] 98 % (07/05 0916) Last BM Date : 09/16/23  Intake/Output from previous day: 07/04 0701 - 07/05 0700 In: 1758.6 [P.O.:100; I.V.:1208.6; NG/GT:450] Out: 750 [Emesis/NG output:750] Intake/Output this shift: No intake/output data recorded.   Abdomen soft nontender without rebound or guarding Lab Results:  Recent Labs    09/17/23 0530 09/18/23 0306  WBC 11.9* 12.5*  HGB 15.4 13.3  HCT 45.8 39.3  PLT 225 217   BMET Recent Labs    09/17/23 0530 09/18/23 0306  NA 142 141  K 3.8 3.5  CL 106 106  CO2 27 27  GLUCOSE 170* 94  BUN 15 22  CREATININE 0.85 1.13  CALCIUM  8.8* 8.6*   PT/INR No results for input(s): LABPROT, INR in the last 72 hours. ABG No results for input(s): PHART, HCO3 in the last 72 hours.  Invalid input(s): PCO2, PO2  Studies/Results: DG Abdomen 1 View Result Date: 09/17/2023 CLINICAL DATA:  NG tube verification. EXAM: ABDOMEN - 1 VIEW COMPARISON:  September 17, 2023 (12:50 a.m.) FINDINGS: A nasogastric tube is seen with its distal end overlying the left lung base. This is overlying a very large gastric hernia. The bowel gas pattern is normal. No radio-opaque calculi or other significant radiographic abnormality are seen. IMPRESSION: Nasogastric tube likely positioned within a known very large gastric hernia. Electronically Signed   By: Suzen Dials M.D.   On: 09/17/2023 02:24   DG Abd Portable 1 View Result Date: 09/17/2023 CLINICAL DATA:  NG tube placement. EXAM: PORTABLE ABDOMEN - 1 VIEW COMPARISON:  None Available. FINDINGS: A nasogastric tube is seen with its distal end overlying the left lung  base. This is likely within a known large hiatal hernia. The bowel gas pattern is normal. No radio-opaque calculi or other significant radiographic abnormality are seen. IMPRESSION: Nasogastric tube positioning, as described above, with its distal end likely positioned within a known large hiatal hernia. Electronically Signed   By: Suzen Dials M.D.   On: 09/17/2023 01:10   CT Chest W Contrast Result Date: 09/16/2023 CLINICAL DATA:  Large hiatal hernia partially visualized on same day CT abdomen pelvis EXAM: CT CHEST WITH CONTRAST TECHNIQUE: Multidetector CT imaging of the chest was performed during intravenous contrast administration. RADIATION DOSE REDUCTION: This exam was performed according to the departmental dose-optimization program which includes automated exposure control, adjustment of the mA and/or kV according to patient size and/or use of iterative reconstruction technique. CONTRAST:  75mL OMNIPAQUE  IOHEXOL  350 MG/ML SOLN COMPARISON:  Same day CT abdomen pelvis FINDINGS: Cardiovascular: Normal heart size. No pericardial effusion. Coronary artery and aortic atherosclerotic calcification. Mediastinum/Nodes: Trachea is unremarkable.  No thoracic adenopathy. Large hiatal hernia containing nearly the entire stomach within the chest marked distention of the stomach with air-fluid levels. There is abrupt narrowing of the gastric antrum near the pylorus duodenal junction as it crosses the diaphragmatic hiatus (series 5/image 93). There is organoaxial rotation of the stomach without evidence of ischemia. Lungs/Pleura: Lower lobe and lingular atelectasis secondary to mass effect from the large hiatal hernia. The lungs are otherwise clear. No pleural effusion or pneumothorax. Upper Abdomen: Hepatic steatosis. Cholelithiasis. No acute abnormality. Musculoskeletal: No acute fracture.  IMPRESSION: 1. Findings compatible with gastric outlet obstruction secondary to a combination of organo-axial rotation of the  stomach within a large hiatal hernia and compression of the gastric outlet as it crosses the diaphragm. 2. Aortic Atherosclerosis (ICD10-I70.0). Electronically Signed   By: Norman Gatlin M.D.   On: 09/16/2023 23:16   CT ABDOMEN PELVIS W CONTRAST Result Date: 09/16/2023 CLINICAL DATA:  epigastric abdominal pain, nausea EXAM: CT ABDOMEN AND PELVIS WITH CONTRAST TECHNIQUE: Multidetector CT imaging of the abdomen and pelvis was performed using the standard protocol following bolus administration of intravenous contrast. RADIATION DOSE REDUCTION: This exam was performed according to the departmental dose-optimization program which includes automated exposure control, adjustment of the mA and/or kV according to patient size and/or use of iterative reconstruction technique. CONTRAST:  75mL OMNIPAQUE  IOHEXOL  350 MG/ML SOLN COMPARISON:  CT 10/07/2018 FINDINGS: Lower chest: Large hiatal hernia which is only partially included in the field of view. The stomach is prominently distended with fluid with faint edema adjacent to the right lateral aspect of the stomach. There is adjacent compressive atelectasis. Hepatobiliary: Tiny cyst in the left lobe of the liver. No suspicious liver lesion. Calcified gallstone. No pericholecystic inflammation. There is no biliary dilatation. Pancreas: No ductal dilatation or inflammation. Spleen: Normal in size without focal abnormality. Adrenals/Urinary Tract: Normal adrenal glands. No hydronephrosis. No renal calculi. There are small bilateral renal cysts. No further follow-up imaging is recommended. Partially distended urinary bladder. Stomach/Bowel: Large hiatal hernia is only partially included in the field of view. Cannot determine the stomach orientation. The herniated stomach is markedly distended with fluid. There is faint edema adjacent to the right lateral aspect of the herniated stomach. Pre pyloric stomach is decompressed. There is a tiny intramural lipoma in the pre pyloric  stomach that is nonobstructing. The duodenum is redundant. No small bowel obstruction or inflammation. There is fecalization of distal small bowel contents. The appendix is normal. Diffuse colonic diverticulosis. No colonic diverticulitis. No colonic inflammation. Vascular/Lymphatic: Aortic atherosclerosis. No aortic aneurysm. There is no portal venous or mesenteric gas. The portal vein is patent. Mild edema in the central small bowel mesentery with small mesenteric lymph nodes. No enlarged lymph nodes by size criteria. Reproductive: Prominent prostate spans 4.7 cm. Other: No free air or ascites. Fat in both inguinal canals, left greater than right. Small fat containing umbilical hernia. Musculoskeletal: There are no acute or suspicious osseous abnormalities. IMPRESSION: 1. Large hiatal hernia which is only partially included in the field of view. The herniated stomach is markedly distended with fluid. Cannot determine the stomach orientation. Findings may be related to gastric outlet obstruction. Consider chest CT for a complete gastric assessment 2. Mild edema in the central small bowel mesentery with small mesenteric lymph nodes, nonspecific. 3. Colonic diverticulosis without diverticulitis. 4. Cholelithiasis without cholecystitis. Aortic Atherosclerosis (ICD10-I70.0). Electronically Signed   By: Andrea Gasman M.D.   On: 09/16/2023 21:46   DG Chest Portable 1 View Result Date: 09/16/2023 CLINICAL DATA:  Chest pain. EXAM: PORTABLE CHEST 1 VIEW COMPARISON:  November 09, 2022 FINDINGS: The cardiac silhouette is enlarged and unchanged in size. A radiopaque loop recorder device is in place. Mild, stable, diffuse, chronic appearing increased interstitial lung markings are noted. There is stable mild to moderate severity elevation of the left hemidiaphragm with mild, stable left basilar linear scarring and/or atelectasis. No pleural effusion or pneumothorax is identified. A large hiatal hernia is noted. The  visualized skeletal structures are unremarkable. IMPRESSION: 1. Stable cardiomegaly with mild, stable left  basilar linear scarring and/or atelectasis. 2. Large hiatal hernia. Electronically Signed   By: Suzen Dials M.D.   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) 01/30/2014   HLD (hyperlipidemia) 01/30/2014   Acute sinusitis 01/15/2014   Cough 01/15/2014   Atrial flutter (HCC) 12/29/2013   Essential hypertension 12/29/2013   Diastolic dysfunction without heart failure 12/29/2013   Anticoagulation management encounter 12/29/2013   Dizziness 12/27/2013   Cerebral thrombosis with cerebral infarction (HCC) 12/27/2013   Hyperlipidemia 12/27/2013   GERD (gastroesophageal reflux disease) 12/27/2013   CKD (chronic kidney disease) stage 2, GFR 60-89 ml/min 12/27/2013   Cerebellar infarct (HCC) 12/27/2013   Obesity (BMI 30-39.9) 12/27/2013      DC NG tube and advance diet.  If he tolerates his diet, he can be discharged tomorrow with outpatient follow-up for elective repair of his hiatal hernia.  If he has more pain or intolerance to diet, he will need to have repair while in the hospital.  I have explained this to the patient his wife at the bedside today and they voiced understanding.   LOS: 1 day    Debby LABOR Jerrod Damiano 09/18/2023 Moderate complexity

## 2023-09-18 NOTE — Progress Notes (Signed)
 [facility] PROGRESS NOTE   Austin Taylor FMW:[id number] DOB: 1940-10-17 DOA: 09/16/2023  PCP: Alben Therisa KANDICE, PA  Brief History: 83 y.o. year old male with past medical history of CKD stage II, hyperlipidemia, obstructive sleep apnea, persistent atrial fibrillation s/p prior DCCV and ablation, CVA on Eliquis , first-degree AV block, chronic diastolic CHF, GERD, and hiatal hernia.  Patient presented with abdominal pain and nausea and vomiting.  Was found to have gastric outlet obstruction secondary to hiatal hernia.  He was hospitalized for further management.     Consultants: General Surgery  Procedures: None yet    Subjective/Interval History: Patient feels well.  Passing some gas from below.  No nausea.  Abdominal pain has improved.  Denies any shortness of breath.    Assessment/Plan:  Gastric outlet obstruction/outlet General surgery is following.  NG tube was placed at admission. Clinically he is stable.  Further management per general surgery.  Acute kidney injury on chronic kidney disease stage II Renal function back to baseline.  Monitor urine output. Supplement potassium.  Magnesium  level is normal.  Chronic diastolic CHF Euvolemic for the most part.  Furosemide  is on hold.  He is NPO.  Will start him on gentle IV hydration.  Persistent atrial fibrillation and history of stroke On Eliquis  which is currently on hold.  He is getting Lovenox .  Stable.  Mild leukocytosis Stable.  Probably reactive.  Obstructive sleep apnea  CPAP on hold due to NG tube.  Obesity Estimated body mass index is 32.08 kg/m as calculated from the following:   Height as of this encounter: 5' 11 (1.803 m).   Weight as of this encounter: 104.3 kg.   DVT Prophylaxis: Lovenox , therapeutic Code Status: Full code Family Communication: Discussed with patient.  No family at bedside Disposition Plan: Mobilize.  Anticipate discharge home when improved  Status is: Inpatient Remains  inpatient appropriate because: Gastric outlet obstruction requiring NG tube      Medications: Scheduled:  enoxaparin  (LOVENOX ) injection  100 mg Subcutaneous Q12H   pantoprazole  (PROTONIX ) IV  40 mg Intravenous Q24H   sodium chloride  flush  3 mL Intravenous Q12H   Continuous:  potassium chloride      PRN:HYDROmorphone  (DILAUDID ) injection, ketorolac , ondansetron  **OR** ondansetron  (ZOFRAN ) IV   Objective:  Vital Signs  Vitals:   09/17/23 1421 09/17/23 1940 09/18/23 0416 09/18/23 0916  BP: 114/68 128/63 (!) 124/94 135/77  Pulse: (!) 59 (!) 59 60 70  Resp: 17 15 15 17   Temp: 98.3 F (36.8 C) 97.9 F (36.6 C) 97.7 F (36.5 C) 98.5 F (36.9 C)  TempSrc: Oral Oral Oral Oral  SpO2: 95% 95% 95% 98%  Weight:      Height:        Intake/Output Summary (Last 24 hours) at 09/18/2023 0931 Last data filed at 09/18/2023 0700 Gross per 24 hour  Intake 1758.61 ml  Output 750 ml  Net 1008.61 ml   Filed Weights   09/16/23 1739  Weight: 104.3 kg    General appearance: Awake alert.  In no distress Resp: Clear to auscultation bilaterally.  Normal effort Cardio: S1-S2 is normal regular.  No S3-S4.  No rubs murmurs or bruit GI: Abdomen is soft.  Nontender nondistended.  Sluggish bowel sounds present. Extremities: No edema.  Full range of motion of lower extremities. Neurologic: Alert and oriented x3.  No focal neurological deficits.    Lab Results:  Data Reviewed: I have personally reviewed following labs and reports of the imaging studies  CBC: Recent  Labs  Lab 09/16/23 1749 09/17/23 0530 09/18/23 0306  WBC 11.6* 11.9* 12.5*  HGB 14.8 15.4 13.3  HCT 44.1 45.8 39.3  MCV 92.3 93.5 94.5  PLT 242 225 217    Basic Metabolic Panel: Recent Labs  Lab 09/16/23 1749 09/17/23 0530 09/18/23 0306  NA 141 142 141  K 3.9 3.8 3.5  CL 104 106 106  CO2 27 27 27   GLUCOSE 200* 170* 94  BUN 16 15 22   CREATININE 1.30* 0.85 1.13  CALCIUM  8.8* 8.8* 8.6*  MG  --  2.3 2.3  PHOS  --   3.2  --     GFR: Estimated Creatinine Clearance: 59.8 mL/min (by C-G formula based on SCr of 1.13 mg/dL).  Liver Function Tests: Recent Labs  Lab 09/16/23 1749  AST 25  ALT 17  ALKPHOS 59  BILITOT 0.9  PROT 6.7  ALBUMIN 3.7    Recent Labs  Lab 09/16/23 1749  LIPASE 29     HbA1C: Recent Labs    09/17/23 0839  HGBA1C 6.1*      Radiology Studies: DG Abdomen 1 View Result Date: 09/17/2023 CLINICAL DATA:  NG tube verification. EXAM: ABDOMEN - 1 VIEW COMPARISON:  September 17, 2023 (12:50 a.m.) FINDINGS: A nasogastric tube is seen with its distal end overlying the left lung base. This is overlying a very large gastric hernia. The bowel gas pattern is normal. No radio-opaque calculi or other significant radiographic abnormality are seen. IMPRESSION: Nasogastric tube likely positioned within a known very large gastric hernia. Electronically Signed   By: Suzen Dials M.D.   On: 09/17/2023 02:24   DG Abd Portable 1 View Result Date: 09/17/2023 CLINICAL DATA:  NG tube placement. EXAM: PORTABLE ABDOMEN - 1 VIEW COMPARISON:  None Available. FINDINGS: A nasogastric tube is seen with its distal end overlying the left lung base. This is likely within a known large hiatal hernia. The bowel gas pattern is normal. No radio-opaque calculi or other significant radiographic abnormality are seen. IMPRESSION: Nasogastric tube positioning, as described above, with its distal end likely positioned within a known large hiatal hernia. Electronically Signed   By: Suzen Dials M.D.   On: 09/17/2023 01:10   CT Chest W Contrast Result Date: 09/16/2023 CLINICAL DATA:  Large hiatal hernia partially visualized on same day CT abdomen pelvis EXAM: CT CHEST WITH CONTRAST TECHNIQUE: Multidetector CT imaging of the chest was performed during intravenous contrast administration. RADIATION DOSE REDUCTION: This exam was performed according to the departmental dose-optimization program which includes automated  exposure control, adjustment of the mA and/or kV according to patient size and/or use of iterative reconstruction technique. CONTRAST:  75mL OMNIPAQUE  IOHEXOL  350 MG/ML SOLN COMPARISON:  Same day CT abdomen pelvis FINDINGS: Cardiovascular: Normal heart size. No pericardial effusion. Coronary artery and aortic atherosclerotic calcification. Mediastinum/Nodes: Trachea is unremarkable.  No thoracic adenopathy. Large hiatal hernia containing nearly the entire stomach within the chest marked distention of the stomach with air-fluid levels. There is abrupt narrowing of the gastric antrum near the pylorus duodenal junction as it crosses the diaphragmatic hiatus (series 5/image 93). There is organoaxial rotation of the stomach without evidence of ischemia. Lungs/Pleura: Lower lobe and lingular atelectasis secondary to mass effect from the large hiatal hernia. The lungs are otherwise clear. No pleural effusion or pneumothorax. Upper Abdomen: Hepatic steatosis. Cholelithiasis. No acute abnormality. Musculoskeletal: No acute fracture. IMPRESSION: 1. Findings compatible with gastric outlet obstruction secondary to a combination of organo-axial rotation of the stomach within a large hiatal  hernia and compression of the gastric outlet as it crosses the diaphragm. 2. Aortic Atherosclerosis (ICD10-I70.0). Electronically Signed   By: Norman Gatlin M.D.   On: 09/16/2023 23:16   CT ABDOMEN PELVIS W CONTRAST Result Date: 09/16/2023 CLINICAL DATA:  epigastric abdominal pain, nausea EXAM: CT ABDOMEN AND PELVIS WITH CONTRAST TECHNIQUE: Multidetector CT imaging of the abdomen and pelvis was performed using the standard protocol following bolus administration of intravenous contrast. RADIATION DOSE REDUCTION: This exam was performed according to the departmental dose-optimization program which includes automated exposure control, adjustment of the mA and/or kV according to patient size and/or use of iterative reconstruction technique.  CONTRAST:  75mL OMNIPAQUE  IOHEXOL  350 MG/ML SOLN COMPARISON:  CT 10/07/2018 FINDINGS: Lower chest: Large hiatal hernia which is only partially included in the field of view. The stomach is prominently distended with fluid with faint edema adjacent to the right lateral aspect of the stomach. There is adjacent compressive atelectasis. Hepatobiliary: Tiny cyst in the left lobe of the liver. No suspicious liver lesion. Calcified gallstone. No pericholecystic inflammation. There is no biliary dilatation. Pancreas: No ductal dilatation or inflammation. Spleen: Normal in size without focal abnormality. Adrenals/Urinary Tract: Normal adrenal glands. No hydronephrosis. No renal calculi. There are small bilateral renal cysts. No further follow-up imaging is recommended. Partially distended urinary bladder. Stomach/Bowel: Large hiatal hernia is only partially included in the field of view. Cannot determine the stomach orientation. The herniated stomach is markedly distended with fluid. There is faint edema adjacent to the right lateral aspect of the herniated stomach. Pre pyloric stomach is decompressed. There is a tiny intramural lipoma in the pre pyloric stomach that is nonobstructing. The duodenum is redundant. No small bowel obstruction or inflammation. There is fecalization of distal small bowel contents. The appendix is normal. Diffuse colonic diverticulosis. No colonic diverticulitis. No colonic inflammation. Vascular/Lymphatic: Aortic atherosclerosis. No aortic aneurysm. There is no portal venous or mesenteric gas. The portal vein is patent. Mild edema in the central small bowel mesentery with small mesenteric lymph nodes. No enlarged lymph nodes by size criteria. Reproductive: Prominent prostate spans 4.7 cm. Other: No free air or ascites. Fat in both inguinal canals, left greater than right. Small fat containing umbilical hernia. Musculoskeletal: There are no acute or suspicious osseous abnormalities. IMPRESSION: 1.  Large hiatal hernia which is only partially included in the field of view. The herniated stomach is markedly distended with fluid. Cannot determine the stomach orientation. Findings may be related to gastric outlet obstruction. Consider chest CT for a complete gastric assessment 2. Mild edema in the central small bowel mesentery with small mesenteric lymph nodes, nonspecific. 3. Colonic diverticulosis without diverticulitis. 4. Cholelithiasis without cholecystitis. Aortic Atherosclerosis (ICD10-I70.0). Electronically Signed   By: Andrea Gasman M.D.   On: 09/16/2023 21:46   DG Chest Portable 1 View Result Date: 09/16/2023 CLINICAL DATA:  Chest pain. EXAM: PORTABLE CHEST 1 VIEW COMPARISON:  November 09, 2022 FINDINGS: The cardiac silhouette is enlarged and unchanged in size. A radiopaque loop recorder device is in place. Mild, stable, diffuse, chronic appearing increased interstitial lung markings are noted. There is stable mild to moderate severity elevation of the left hemidiaphragm with mild, stable left basilar linear scarring and/or atelectasis. No pleural effusion or pneumothorax is identified. A large hiatal hernia is noted. The visualized skeletal structures are unremarkable. IMPRESSION: 1. Stable cardiomegaly with mild, stable left basilar linear scarring and/or atelectasis. 2. Large hiatal hernia. Electronically Signed   By: Suzen Dials M.D.   On: 09/16/2023 19:10  LOS: 1 day   Austin Taylor  [facility] Pager on [email or web address]  09/18/2023, 9:31 AM

## 2023-09-19 DIAGNOSIS — K311 Adult hypertrophic pyloric stenosis: Secondary | ICD-10-CM | POA: Diagnosis not present

## 2023-09-19 LAB — BASIC METABOLIC PANEL WITH GFR
Anion gap: 6 (ref 5–15)
BUN: 17 mg/dL (ref 8–23)
CO2: 25 mmol/L (ref 22–32)
Calcium: 8.2 mg/dL — ABNORMAL LOW (ref 8.9–10.3)
Chloride: 108 mmol/L (ref 98–111)
Creatinine, Ser: 1.08 mg/dL (ref 0.61–1.24)
GFR, Estimated: >60 mL/min (ref >60)
Glucose, Bld: 98 mg/dL (ref 70–99)
Potassium: 4 mmol/L (ref 3.5–5.1)
Sodium: 139 mmol/L (ref 135–145)

## 2023-09-19 MED ORDER — OMEPRAZOLE 20 MG PO CPDR
20.0000 mg | DELAYED_RELEASE_CAPSULE | Freq: Every day | ORAL | 1 refills | Status: AC
Start: 2023-09-19 — End: ?

## 2023-09-19 NOTE — Discharge Summary (Signed)
 [facility]  Physician Discharge Summary   Patient ID: Austin Taylor MRN: [id number] DOB/AGE: 1941-02-14 83 y.o.  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 year old male with past medical history of CKD stage II, hyperlipidemia, obstructive sleep apnea, persistent atrial fibrillation s/p prior DCCV and ablation, CVA on Eliquis , first-degree AV block, chronic diastolic CHF, GERD, and hiatal hernia.  Patient presented with abdominal pain and nausea and vomiting.  Was found to have gastric outlet obstruction secondary to hiatal hernia.  He was hospitalized for further management.      Consultants: General Surgery   Procedures: None   HOSPITAL COURSE:   Gastric outlet obstruction/outlet General surgery is following.  NG tube was placed at admission. Patient has improved.  Having bowel movements.  Passing gas.  NGT was removed.  Diet was slowly advanced.  He tolerated it well.  Cleared by general surgery for discharge today.   Acute kidney injury on chronic kidney disease stage II Renal function back to baseline.     Chronic diastolic CHF Stable   Persistent atrial fibrillation and history of stroke May resume Eliquis  at discharge   Mild leukocytosis Stable.  Probably reactive.   Obstructive sleep apnea     Obesity Estimated body mass index is 32.08 kg/m as  calculated from the following:   Height as of this encounter: 5' 11 (1.803 m).   Weight as of this encounter: 104.3 kg.  Patient is stable for discharge home today.  PERTINENT LABS:  The results of significant diagnostics from this hospitalization (including imaging, microbiology, ancillary and laboratory) are listed below for reference.     Labs:   Basic Metabolic Panel: Recent Labs  Lab 09/16/23 1749 09/17/23 0530 09/18/23 0306 09/19/23 0505  NA 141 142 141 139  K 3.9 3.8 3.5 4.0  CL 104 106 106 108  CO2 27 27 27 25   GLUCOSE 200* 170* 94 98  BUN 16 15 22 17   CREATININE 1.30* 0.85 1.13 1.08  CALCIUM  8.8* 8.8* 8.6* 8.2*  MG  --  2.3 2.3  --   PHOS  --  3.2  --   --    Liver Function Tests: Recent Labs  Lab 09/16/23 1749  AST 25  ALT 17  ALKPHOS 59  BILITOT 0.9  PROT 6.7  ALBUMIN 3.7   Recent Labs  Lab 09/16/23 1749  LIPASE 29    CBC: Recent Labs  Lab 09/16/23 1749 09/17/23 0530 09/18/23 0306  WBC 11.6* 11.9* 12.5*  HGB 14.8 15.4 13.3  HCT 44.1 45.8 39.3  MCV 92.3 93.5 94.5  PLT 242 225 217     IMAGING STUDIES DG Abdomen 1 View Result Date: 09/17/2023 CLINICAL DATA:  NG tube verification. EXAM: ABDOMEN - 1 VIEW COMPARISON:  September 17, 2023 (12:50 a.m.) FINDINGS: A nasogastric tube is seen with its distal end overlying the left lung base. This is overlying a very large  gastric hernia. The bowel gas pattern is normal. No radio-opaque calculi or other significant radiographic abnormality are seen. IMPRESSION: Nasogastric tube likely positioned within a known very large gastric hernia. Electronically Signed   By: Suzen Dials M.D.   On: 09/17/2023 02:24   DG Abd Portable 1 View Result Date: 09/17/2023 CLINICAL DATA:  NG tube placement. EXAM: PORTABLE ABDOMEN - 1 VIEW COMPARISON:  None Available. FINDINGS: A nasogastric tube is seen with its distal end overlying the left lung base. This is likely within a known large hiatal hernia. The bowel gas pattern  is normal. No radio-opaque calculi or other significant radiographic abnormality are seen. IMPRESSION: Nasogastric tube positioning, as described above, with its distal end likely positioned within a known large hiatal hernia. Electronically Signed   By: Suzen Dials M.D.   On: 09/17/2023 01:10   CT Chest W Contrast Result Date: 09/16/2023 CLINICAL DATA:  Large hiatal hernia partially visualized on same day CT abdomen pelvis EXAM: CT CHEST WITH CONTRAST TECHNIQUE: Multidetector CT imaging of the chest was performed during intravenous contrast administration. RADIATION DOSE REDUCTION: This exam was performed according to the departmental dose-optimization program which includes automated exposure control, adjustment of the mA and/or kV according to patient size and/or use of iterative reconstruction technique. CONTRAST:  75mL OMNIPAQUE  IOHEXOL  350 MG/ML SOLN COMPARISON:  Same day CT abdomen pelvis FINDINGS: Cardiovascular: Normal heart size. No pericardial effusion. Coronary artery and aortic atherosclerotic calcification. Mediastinum/Nodes: Trachea is unremarkable.  No thoracic adenopathy. Large hiatal hernia containing nearly the entire stomach within the chest marked distention of the stomach with air-fluid levels. There is abrupt narrowing of the gastric antrum near the pylorus duodenal junction as it crosses the diaphragmatic hiatus (series 5/image 93). There is organoaxial rotation of the stomach without evidence of ischemia. Lungs/Pleura: Lower lobe and lingular atelectasis secondary to mass effect from the large hiatal hernia. The lungs are otherwise clear. No pleural effusion or pneumothorax. Upper Abdomen: Hepatic steatosis. Cholelithiasis. No acute abnormality. Musculoskeletal: No acute fracture. IMPRESSION: 1. Findings compatible with gastric outlet obstruction secondary to a combination of organo-axial rotation of the stomach within a large hiatal hernia and compression of the gastric outlet as it  crosses the diaphragm. 2. Aortic Atherosclerosis (ICD10-I70.0). Electronically Signed   By: Norman Gatlin M.D.   On: 09/16/2023 23:16   CT ABDOMEN PELVIS W CONTRAST Result Date: 09/16/2023 CLINICAL DATA:  epigastric abdominal pain, nausea EXAM: CT ABDOMEN AND PELVIS WITH CONTRAST TECHNIQUE: Multidetector CT imaging of the abdomen and pelvis was performed using the standard protocol following bolus administration of intravenous contrast. RADIATION DOSE REDUCTION: This exam was performed according to the departmental dose-optimization program which includes automated exposure control, adjustment of the mA and/or kV according to patient size and/or use of iterative reconstruction technique. CONTRAST:  75mL OMNIPAQUE  IOHEXOL  350 MG/ML SOLN COMPARISON:  CT 10/07/2018 FINDINGS: Lower chest: Large hiatal hernia which is only partially included in the field of view. The stomach is prominently distended with fluid with faint edema adjacent to the right lateral aspect of the stomach. There is adjacent compressive atelectasis. Hepatobiliary: Tiny cyst in the left lobe of the liver. No suspicious liver lesion. Calcified gallstone. No pericholecystic inflammation. There is no biliary dilatation. Pancreas: No ductal dilatation or inflammation. Spleen: Normal in size without focal abnormality. Adrenals/Urinary Tract: Normal adrenal glands. No hydronephrosis. No renal calculi. There are small bilateral renal cysts. No further follow-up imaging is recommended. Partially distended urinary bladder. Stomach/Bowel: Large hiatal hernia is only partially included  in the field of view. Cannot determine the stomach orientation. The herniated stomach is markedly distended with fluid. There is faint edema adjacent to the right lateral aspect of the herniated stomach. Pre pyloric stomach is decompressed. There is a tiny intramural lipoma in the pre pyloric stomach that is nonobstructing. The duodenum is redundant. No small bowel  obstruction or inflammation. There is fecalization of distal small bowel contents. The appendix is normal. Diffuse colonic diverticulosis. No colonic diverticulitis. No colonic inflammation. Vascular/Lymphatic: Aortic atherosclerosis. No aortic aneurysm. There is no portal venous or mesenteric gas. The portal vein is patent. Mild edema in the central small bowel mesentery with small mesenteric lymph nodes. No enlarged lymph nodes by size criteria. Reproductive: Prominent prostate spans 4.7 cm. Other: No free air or ascites. Fat in both inguinal canals, left greater than right. Small fat containing umbilical hernia. Musculoskeletal: There are no acute or suspicious osseous abnormalities. IMPRESSION: 1. Large hiatal hernia which is only partially included in the field of view. The herniated stomach is markedly distended with fluid. Cannot determine the stomach orientation. Findings may be related to gastric outlet obstruction. Consider chest CT for a complete gastric assessment 2. Mild edema in the central small bowel mesentery with small mesenteric lymph nodes, nonspecific. 3. Colonic diverticulosis without diverticulitis. 4. Cholelithiasis without cholecystitis. Aortic Atherosclerosis (ICD10-I70.0). Electronically Signed   By: Andrea Gasman M.D.   On: 09/16/2023 21:46   DG Chest Portable 1 View Result Date: 09/16/2023 CLINICAL DATA:  Chest pain. EXAM: PORTABLE CHEST 1 VIEW COMPARISON:  November 09, 2022 FINDINGS: The cardiac silhouette is enlarged and unchanged in size. A radiopaque loop recorder device is in place. Mild, stable, diffuse, chronic appearing increased interstitial lung markings are noted. There is stable mild to moderate severity elevation of the left hemidiaphragm with mild, stable left basilar linear scarring and/or atelectasis. No pleural effusion or pneumothorax is identified. A large hiatal hernia is noted. The visualized skeletal structures are unremarkable. IMPRESSION: 1. Stable cardiomegaly  with mild, stable left basilar linear scarring and/or atelectasis. 2. Large hiatal hernia. Electronically Signed   By: Suzen Dials M.D.   On: 09/16/2023 19:10    DISCHARGE EXAMINATION: Vitals:   09/18/23 1531 09/18/23 2147 09/19/23 0538 09/19/23 0724  BP: 131/67 125/69 112/75 136/66  Pulse: 60 (!) 54 (!) 56 (!) 49  Resp: 16 18 18    Temp: 98.8 F (37.1 C) 97.7 F (36.5 C) 97.9 F (36.6 C) 97.8 F (36.6 C)  TempSrc: Oral Oral Oral   SpO2: 95% 96% 93% 96%  Weight:      Height:       General appearance: Awake alert.  In no distress Resp: Clear to auscultation bilaterally.  Normal effort Cardio: S1-S2 is normal regular.  No S3-S4.  No rubs murmurs or bruit    DISPOSITION: Home  Discharge Instructions     Call MD for:  persistant dizziness or light-headedness   Complete by: As directed    Call MD for:  persistant nausea and vomiting   Complete by: As directed    Call MD for:  severe uncontrolled pain   Complete by: As directed    Call MD for:  temperature >100.4   Complete by: As directed    Diet - low sodium heart healthy   Complete by: As directed    Discharge instructions   Complete by: As directed    Please follow instructions provided by general surgery.  You were cared for by a hospitalist during your hospital  stay. If you have any questions about your discharge medications or the care you received while you were in the hospital after you are discharged, you can call the unit and asked to speak with the hospitalist on call if the hospitalist that took care of you is not available. Once you are discharged, your primary care physician will handle any further medical issues. Please note that NO REFILLS for any discharge medications will be authorized once you are discharged, as it is imperative that you return to your primary care physician (or establish a relationship with a primary care physician if you do not have one) for your aftercare needs so that they can  reassess your need for medications and monitor your lab values. If you do not have a primary care physician, you can call [phone] for a physician referral.   Increase activity slowly   Complete by: As directed          Allergies as of 09/19/2023       Reactions   Codeine Nausea Only           Medication List     TAKE these medications    acetaminophen  650 MG CR tablet Commonly known as: TYLENOL  Take 1,300 mg by mouth as needed for pain.   Eliquis  5 MG Tabs tablet Generic drug: apixaban  TAKE 1 TABLET BY MOUTH TWICE DAILY   ferrous sulfate 325 (65 FE) MG tablet Take 325 mg by mouth every Monday, Wednesday, and Friday.   furosemide  40 MG tablet Commonly known as: LASIX  Take 1 tablet (40 mg total) by mouth daily.   omeprazole  20 MG capsule Commonly known as: PRILOSEC Take 1 capsule (20 mg total) by mouth daily.   PROBIOTIC PO Take 1 tablet by mouth every evening.   sildenafil 100 MG tablet Commonly known as: VIAGRA Take 25-50 mg by mouth as needed for erectile dysfunction.   simvastatin  40 MG tablet Commonly known as: ZOCOR  Take 1 tablet (40 mg total) by mouth every evening.          Follow-up Information     [facility] Follow up.   Specialty: General Surgery Contact information: [address] [place] [address] [phone]         Alben Therisa MATSU, PA. Schedule an appointment as soon as possible for a visit in 1 week(s).   Specialty: Family Medicine Why: post hospitalization follow up Contact information: [address] [place] [address] [phone]                 TOTAL DISCHARGE TIME: 35 minutes  Hadrian Yarbrough Verdene  [facility] Pager on [email or web address]  09/20/2023, 11:11 AM

## 2023-09-19 NOTE — Plan of Care (Signed)
   Problem: Activity: Goal: Risk for activity intolerance will decrease Outcome: Progressing   Problem: Safety: Goal: Ability to remain free from injury will improve Outcome: Progressing   Problem: Skin Integrity: Goal: Risk for impaired skin integrity will decrease Outcome: Progressing

## 2023-09-19 NOTE — Progress Notes (Signed)
   Subjective/Chief Complaint: Patient denies abdominal pain.  He is tolerating his diet.   Objective: Vital signs in last 24 hours: Temp:  [97.7 F (36.5 C)-98.8 F (37.1 C)] 97.8 F (36.6 C) (07/06 0724) Pulse Rate:  [49-60] 49 (07/06 0724) Resp:  [16-18] 18 (07/06 0538) BP: (112-136)/(66-75) 136/66 (07/06 0724) SpO2:  [93 %-96 %] 96 % (07/06 0724) Last BM Date : 09/16/23  Intake/Output from previous day: 07/05 0701 - 07/06 0700 In: 609.2 [I.V.:225; IV Piggyback:384.2] Out: -  Intake/Output this shift: No intake/output data recorded.  Abdomen: Soft nontender nondistended without rebound or guarding  Lab Results:  Recent Labs    09/17/23 0530 09/18/23 0306  WBC 11.9* 12.5*  HGB 15.4 13.3  HCT 45.8 39.3  PLT 225 217   BMET Recent Labs    09/18/23 0306 09/19/23 0505  NA 141 139  K 3.5 4.0  CL 106 108  CO2 27 25  GLUCOSE 94 98  BUN 22 17  CREATININE 1.13 1.08  CALCIUM  8.6* 8.2*   PT/INR No results for input(s): LABPROT, INR in the last 72 hours. ABG No results for input(s): PHART, HCO3 in the last 72 hours.  Invalid input(s): PCO2, PO2  Studies/Results: No results found.  Anti-infectives: Anti-infectives (From admission, onward)    None       Assessment/Plan: Large paraesophageal hernia with intermittent obstruction now resolved  He will benefit from outpatient elective repair as long as his symptoms do not recur.  Advance diet  Okay for discharge from surgery standpoint.  Dietary instructions given to patient wife this morning  Will arrange for office follow-up with the office calling in this week to see one of our foregut surgeons  Return to hospital if symptoms recur   LOS: 2 days    Debby A Kolina Kube 09/19/2023 Moderate

## 2023-09-20 ENCOUNTER — Ambulatory Visit: Payer: Medicare Other

## 2023-09-29 ENCOUNTER — Telehealth: Payer: Self-pay

## 2023-09-29 DIAGNOSIS — K449 Diaphragmatic hernia without obstruction or gangrene: Secondary | ICD-10-CM | POA: Diagnosis not present

## 2023-09-29 DIAGNOSIS — I480 Paroxysmal atrial fibrillation: Secondary | ICD-10-CM | POA: Diagnosis not present

## 2023-09-29 NOTE — Telephone Encounter (Signed)
..     Pre-operative Risk Assessment    Patient Name: Austin Taylor  DOB: 10-19-1940 MRN: [id number]   Date of last office visit: 06/28/23 DR CAMNITZ Date of next office visit: 01/06/24 [facility]   Request for Surgical Clearance    Procedure:  ROBOTIC HIATAL HERNIA REPAIR  Date of Surgery:  Clearance TBD                                Surgeon:  DR HERLENE BUREAU Surgeon's Group or Practice Name:  [facility] Phone number:  [phone] Fax number:  [phone]   Type of Clearance Requested:   - Medical  - Pharmacy:  Hold Apixaban  (Eliquis )     Type of Anesthesia:  General    Additional requests/questions:    [name]   09/29/2023, 1:53 PM

## 2023-09-29 NOTE — Telephone Encounter (Signed)
 Pharmacy please advise on holding Eliquis  prior to Robotic Hiatal Hernia Repair scheduled for TBD. Thank you.

## 2023-09-30 DIAGNOSIS — Z9849 Cataract extraction status, unspecified eye: Secondary | ICD-10-CM | POA: Diagnosis not present

## 2023-09-30 DIAGNOSIS — Z961 Presence of intraocular lens: Secondary | ICD-10-CM | POA: Diagnosis not present

## 2023-09-30 DIAGNOSIS — H53143 Visual discomfort, bilateral: Secondary | ICD-10-CM | POA: Diagnosis not present

## 2023-10-04 NOTE — Telephone Encounter (Signed)
 Patient with diagnosis of afib on Eliquis  for anticoagulation.    Procedure: ROBOTIC HIATAL HERNIA REPAIR  Date of procedure: TBD   CHA2DS2-VASc Score = 4   This indicates a 4.8% annual risk of stroke. The patient's score is based upon: CHF History: 0 HTN History: 0 Diabetes History: 0 Stroke History: 2 Vascular Disease History: 0 Age Score: 2 Gender Score: 0      CrCl 62 ml/min Platelet count 217  Patient has not had an Afib/aflutter ablation within the last 3 months or DCCV within the last 30 days  Per office protocol, patient can hold Eliquis  for 2 days prior to procedure.    **This guidance is not considered finalized until pre-operative APP has relayed final recommendations.**

## 2023-10-05 ENCOUNTER — Telehealth: Payer: Self-pay

## 2023-10-05 NOTE — Telephone Encounter (Signed)
 Primary Cardiologist:Paula Okey, MD   Preoperative team, please contact this patient and set up a phone call appointment for further preoperative risk assessment. Please obtain consent and complete medication review. Thank you for your help.   I confirm that guidance regarding antiplatelet and oral anticoagulation therapy has been completed and, if necessary, noted below.  Per office protocol, patient can hold Eliquis  for 2 days prior to procedure.    I also confirmed the patient resides in the state of [place] . As per [facility] Medical Board telemedicine laws, the patient must reside in the state in which the provider is licensed.   Rosaline EMERSON Bane, NP-C  10/05/2023, 8:33 AM [address] [place], [place] [address] Office [phone] Fax [phone]

## 2023-10-05 NOTE — Telephone Encounter (Signed)
Patient has been scheduled for televisit.

## 2023-10-05 NOTE — Telephone Encounter (Signed)
 Patient has been scheduled and consent done     Patient Consent for Virtual Visit         Austin Taylor has provided verbal consent on 10/05/2023 for a virtual visit (video or telephone).   CONSENT FOR VIRTUAL VISIT FOR:  Austin Taylor  By participating in this virtual visit I agree to the following:  I hereby voluntarily request, consent and authorize [facility] and its employed or contracted physicians, physician assistants, nurse practitioners or other licensed health care professionals (the Practitioner), to provide me with telemedicine health care services (the "Services) as deemed necessary by the treating Practitioner. I acknowledge and consent to receive the Services by the Practitioner via telemedicine. I understand that the telemedicine visit will involve communicating with the Practitioner through live audiovisual communication technology and the disclosure of certain medical information by electronic transmission. I acknowledge that I have been given the opportunity to request an in-person assessment or other available alternative prior to the telemedicine visit and am voluntarily participating in the telemedicine visit.  I understand that I have the right to withhold or withdraw my consent to the use of telemedicine in the course of my care at any time, without affecting my right to future care or treatment, and that the Practitioner or I may terminate the telemedicine visit at any time. I understand that I have the right to inspect all information obtained and/or recorded in the course of the telemedicine visit and may receive copies of available information for a reasonable fee.  I understand that some of the potential risks of receiving the Services via telemedicine include:  Delay or interruption in medical evaluation due to technological equipment failure or disruption; Information transmitted may not be sufficient (e.g. poor resolution of images) to allow for  appropriate medical decision making by the Practitioner; and/or  In rare instances, security protocols could fail, causing a breach of personal health information.  Furthermore, I acknowledge that it is my responsibility to provide information about my medical history, conditions and care that is complete and accurate to the best of my ability. I acknowledge that Practitioner's advice, recommendations, and/or decision may be based on factors not within their control, such as incomplete or inaccurate data provided by me or distortions of diagnostic images or specimens that may result from electronic transmissions. I understand that the practice of medicine is not an exact science and that Practitioner makes no warranties or guarantees regarding treatment outcomes. I acknowledge that a copy of this consent can be made available to me via my patient portal [facility] MyChart), or I can request a printed copy by calling the office of [facility].    I understand that my insurance will be billed for this visit.   I have read or had this consent read to me. I understand the contents of this consent, which adequately explains the benefits and risks of the Services being provided via telemedicine.  I have been provided ample opportunity to ask questions regarding this consent and the Services and have had my questions answered to my satisfaction. I give my informed consent for the services to be provided through the use of telemedicine in my medical care

## 2023-10-14 DIAGNOSIS — G4733 Obstructive sleep apnea (adult) (pediatric): Secondary | ICD-10-CM | POA: Diagnosis not present

## 2023-10-19 ENCOUNTER — Ambulatory Visit: Attending: Cardiovascular Disease

## 2023-10-19 DIAGNOSIS — Z01810 Encounter for preprocedural cardiovascular examination: Secondary | ICD-10-CM

## 2023-10-19 NOTE — Progress Notes (Signed)
 Virtual Visit via Telephone Note   Because of Austin Taylor co-morbid illnesses, he is at least at moderate risk for complications without adequate follow up.  This format is felt to be most appropriate for this patient at this time.  Due to technical limitations with video connection Web designer), today's appointment will be conducted as an audio only telehealth visit, and Austin Taylor verbally agreed to proceed in this manner.   All issues noted in this document were discussed and addressed.  No physical exam could be performed with this format.  Evaluation Performed:  Preoperative cardiovascular risk assessment _____________   Date:  10/19/2023   Patient ID:  Austin Taylor, DOB June 04, 1940, MRN [id number] Patient Location:  Home Provider location:   Office  Primary Care Provider:  Alben Therisa KANDICE, PA Primary Cardiologist:  Vina Gull, MD  Chief Complaint / Patient Profile   83 y.o. y/o male with a h/o atrial flutter, hypertension, chronic diastolic CHF who is pending robotic hiatal hernia repair and presents today for telephonic preoperative cardiovascular risk assessment.  History of Present Illness    Austin Taylor is a 83 y.o. male who presents via audio/video conferencing for a telehealth visit today.  Pt was last seen in cardiology clinic on 06/28/2023 by Dr. Inocencio.  At that time Austin Taylor was doing well .  The patient is now pending procedure as outlined above. Since his last visit, he continues to be stable from a cardiac standpoint.  Today he denies chest pain, shortness of breath, lower extremity edema, fatigue, palpitations, melena, hematuria, hemoptysis, diaphoresis, weakness, presyncope, syncope, orthopnea, and PND.   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   small one; denies residual on 08/25/2016   Past Surgical History:  Procedure Laterality Date   ATRIAL FIBRILLATION ABLATION  08/25/2016   ATRIAL FIBRILLATION ABLATION N/A 08/25/2016   Procedure: Atrial Fibrillation Ablation;  Surgeon: Kelsie Agent, MD;  Location: [facility] INVASIVE CV LAB;  Service: Cardiovascular;  Laterality: N/A;   CARDIOVERSION N/A 09/09/2015   Procedure: CARDIOVERSION;  Surgeon: Vina LULLA Gull, MD;  Location: [facility] ENDOSCOPY;  Service: Cardiovascular;  Laterality: N/A;   CARDIOVERSION N/A 11/20/2015   Procedure: CARDIOVERSION;  Surgeon: Vinie JAYSON Maxcy, MD;  Location: [facility] ENDOSCOPY;  Service: Cardiovascular;  Laterality: N/A;   implantable loop recorder placement  10/24/2019   Medtronic Reveal Firth model [id number] (SN # [id number] ) implantable loop recorder implanted.  old device (RRT) removed   LOOP RECORDER INSERTION N/A 04/21/2016   Procedure: Loop Recorder Insertion;  Surgeon: Agent Kelsie, MD;  Location: [facility] INVASIVE CV LAB;  Service: Cardiovascular;  Laterality: N/A;   MOHS SURGERY     ear   SHOULDER OPEN ROTATOR CUFF REPAIR Left 1998   TEE WITHOUT CARDIOVERSION N/A 08/25/2016   Procedure: TRANSESOPHAGEAL ECHOCARDIOGRAM (TEE);  Surgeon: Jeffrie Oneil JAYSON, MD;  Location: [facility] ENDOSCOPY;  Service: Cardiovascular;  Laterality: N/A;    Allergies  Allergies  Allergen Reactions   Codeine Nausea Only         Home Medications    Prior to Admission medications   Medication Sig Start Date  End Date Taking? Authorizing Provider  acetaminophen  (TYLENOL ) 650 MG CR tablet Take 1,300 mg by mouth as needed for pain.    [provider]  ELIQUIS  5 MG TABS tablet TAKE 1 TABLET BY MOUTH TWICE DAILY 06/17/23   Ross, Paula V, MD  ferrous sulfate 325 (65 FE) MG tablet Take 325 mg by mouth every Monday, Wednesday, and Friday.     [provider]  furosemide  (LASIX ) 40 MG tablet Take 1 tablet (40 mg total)  by mouth daily. 12/23/22   Lesia Ozell Barter, PA-C  omeprazole  (PRILOSEC) 20 MG capsule Take 1 capsule (20 mg total) by mouth daily. 09/19/23   Krishnan, Gokul, MD  Probiotic Product (PROBIOTIC PO) Take 1 tablet by mouth every evening.    [provider]  sildenafil (VIAGRA) 100 MG tablet Take 25-50 mg by mouth as needed for erectile dysfunction.    [provider]  simvastatin  (ZOCOR ) 40 MG tablet Take 1 tablet (40 mg total) by mouth every evening. 03/01/23   Lesia Ozell Barter, PA-C    Physical Exam    Vital Signs:  Austin Taylor does not have vital signs available for review today.  Given telephonic nature of communication, physical exam is limited. AAOx3. NAD. Normal affect.  Speech and respirations are unlabored.  Accessory Clinical Findings    None  Assessment & Plan    1.  Preoperative Cardiovascular Risk Assessment:ROBOTIC HIATAL HERNIA REPAIR   Date of Surgery:  Clearance TBD                                  Surgeon:  DR HERLENE BUREAU Surgeon's Group or Practice Name:  [facility] Phone number:  [phone] Fax number:  [phone]      Primary Cardiologist: Vina Gull, MD  Chart reviewed as part of pre-operative protocol coverage. Given past medical history and time since last visit, based on ACC/AHA guidelines, Austin Taylor would be at acceptable risk for the planned procedure without further cardiovascular testing.   His RCRI is moderate risk, 6.6% risk of major cardiac event.  He is able to complete greater than 4 METS of physical activity.  Patient was advised that if he develops new symptoms prior to surgery to contact our office to arrange a follow-up appointment.  He verbalized understanding.  Per office protocol, patient can hold Eliquis  for 2 days prior to procedure .  I will route this recommendation to the requesting party via Epic fax function and remove from pre-op pool.      Time:   Today, I have  spent 5 minutes with the patient with telehealth technology discussing medical history, symptoms, and management plan.  I spent 10 minutes reviewing patient's past cardiac history and cardiac medications.    Josefa CHRISTELLA Beauvais, NP  10/19/2023, 7:42 AM

## 2023-10-22 ENCOUNTER — Ambulatory Visit: Payer: Medicare Other

## 2023-11-02 ENCOUNTER — Ambulatory Visit: Payer: Self-pay | Admitting: General Surgery

## 2023-11-14 NOTE — Patient Instructions (Signed)
 SURGICAL WAITING ROOM VISITATION Patients having surgery or a procedure may have no more than 2 support people in the waiting area - these visitors may rotate in the visitor waiting room.   If the patient needs to stay at the hospital during part of their recovery, the visitor guidelines for inpatient rooms apply.  PRE-OP VISITATION  Pre-op nurse will coordinate an appropriate time for 1 support person to accompany the patient in pre-op.  This support person may not rotate.  This visitor will be contacted when the time is appropriate for the visitor to come back in the pre-op area.  Please refer to the [facility] website for the visitor guidelines for Inpatients (after your surgery is over and you are in a regular room).  You are not required to quarantine at this time prior to your surgery. However, you must do this: Hand Hygiene often Do NOT share personal items Notify your provider if you are in close contact with someone who has COVID or you develop fever 100.4 or greater, new onset of sneezing, cough, sore throat, shortness of breath or body aches.  If you test positive for Covid or have been in contact with anyone that has tested positive in the last 10 days please notify you surgeon.    Your procedure is scheduled on:  MONDAY  November 29, 2023  Report to [facility] Main Entrance: [address] entrance where the [employer] is available.   Report to admitting at:  10:45  AM  Call this number if you have any questions or problems the morning of surgery [phone]  FOLLOW ANY ADDITIONAL PRE OP INSTRUCTIONS YOU RECEIVED FROM YOUR SURGEON'S OFFICE!!!  Do not eat food after Midnight the night prior to your surgery/procedure.  After Midnight you may have the following liquids until 10:00  AM DAY OF SURGERY  Clear Liquid Diet Water Black Coffee (sugar ok, NO MILK/CREAM OR CREAMERS)  Tea (sugar ok, NO MILK/CREAM OR CREAMERS) regular and decaf                              Plain Jell-O  with no fruit (NO RED)                                           Fruit ices (not with fruit pulp, NO RED)                                     Popsicles (NO RED)                                                                  Juice: NO CITRUS JUICES: only apple, WHITE grape, WHITE cranberry Sports drinks like Gatorade or Powerade (NO RED)                   The day of surgery:  Drink ONE (1) Pre-Surgery G2 at  10:00 AM the morning of surgery. Drink in one sitting. Do not sip.  This drink was given to you during  your hospital pre-op appointment visit. Nothing else to drink after completing the Pre-Surgery G2 : No candy, chewing gum or throat lozenges.     Oral Hygiene is also important to reduce your risk of infection.        Remember - BRUSH YOUR TEETH THE MORNING OF SURGERY WITH YOUR REGULAR TOOTHPASTE  Do NOT smoke after Midnight the night before surgery.  STOP TAKING all Vitamins, Herbs and supplements 1 week before your surgery.   ELIQUIS , Stop taking 48 hours before your surgery. Your last dose will be taken on Friday 11-26-23.   Take ONLY these medicines the morning of surgery with A SIP OF WATER: Omeprazole , and Tylenol  if needed for pain.   DO NOT TAKE Furosemide  the morning of your surgery.    If You have been diagnosed with Sleep Apnea - Bring CPAP mask and tubing day of surgery. We will provide you with a CPAP machine on the day of your surgery.                   You may not have any metal on your body including  jewelry, and body piercing  Do not wear  lotions, powders, cologne, or deodorant  Men may shave face and neck.  Contacts, Hearing Aids, dentures or bridgework may not be worn into surgery. DENTURES WILL BE REMOVED PRIOR TO SURGERY PLEASE DO NOT APPLY Poly grip OR ADHESIVES!!!  You may bring a small overnight bag with you on the day of surgery, only pack items that are not valuable. [facility] IS NOT RESPONSIBLE   FOR VALUABLES THAT ARE LOST  OR STOLEN.   Do not bring your home medications to the hospital. The Pharmacy will dispense medications listed on your medication list to you during your admission in the Hospital.   Please read over the following fact sheets you were given: IF YOU HAVE QUESTIONS ABOUT YOUR PRE-OP INSTRUCTIONS, PLEASE CALL [phone]   [facility] - Preparing for Surgery Before surgery, you can play an important role.  Because skin is not sterile, your skin needs to be as free of germs as possible.  You can reduce the number of germs on your skin by washing with CHG (chlorahexidine gluconate) soap before surgery.  CHG is an antiseptic cleaner which kills germs and bonds with the skin to continue killing germs even after washing. Please DO NOT use if you have an allergy to CHG or antibacterial soaps.  If your skin becomes reddened/irritated stop using the CHG and inform your nurse when you arrive at Short Stay. Do not shave (including legs and underarms) for at least 48 hours prior to the first CHG shower.  You may shave your face/neck.  Please follow these instructions carefully:  1.  Shower with CHG Soap the night before surgery and the  morning of surgery.  2.  If you choose to wash your hair, wash your hair first as usual with your normal  shampoo.  3.  After you shampoo, rinse your hair and body thoroughly to remove the shampoo.                             4.  Use CHG as you would any other liquid soap.  You can apply chg directly to the skin and wash.  Gently with a scrungie or clean washcloth.  5.  Apply the CHG Soap to your body ONLY FROM THE NECK DOWN.  Do not use on face/ open                           Wound or open sores. Avoid contact with eyes, ears mouth and genitals (private parts).                       Wash face,  Genitals (private parts) with your normal soap.             6.  Wash thoroughly, paying special attention to the area where your  surgery  will be performed.  7.  Thoroughly rinse  your body with warm water from the neck down.  8.  DO NOT shower/wash with your normal soap after using and rinsing off the CHG Soap.            9.  Pat yourself dry with a clean towel.            10.  Wear clean pajamas.            11.  Place clean sheets on your bed the night of your first shower and do not  sleep with pets.  ON THE DAY OF SURGERY : Do not apply any lotions/deodorants the morning of surgery.  Please wear clean clothes to the hospital/surgery center.     FAILURE TO FOLLOW THESE INSTRUCTIONS MAY RESULT IN THE CANCELLATION OF YOUR SURGERY  PATIENT SIGNATURE_________________________________  NURSE SIGNATURE__________________________________  ________________________________________________________________________

## 2023-11-14 NOTE — Progress Notes (Signed)
 COVID Vaccine received:  []  No [x]  Yes Date of any COVID positive Test in last 90 days:  PCP - Therisa Holm, PA  [phone]  Cardiologist - Vina Gull, MD,  Josefa Beauvais, NP  Cardiac clearance in 10-19-23 Epic note EP- Soyla Norton, MD   Chest x-ray - 09-16-2023  2v  Epic EKG - 09-18-2023  Epic  Stress Test -  ECHO -  Cardiac Cath -  CT Coronary Calcium  score:   Bowel Prep - [x]  No  []   Yes ______  Pacemaker / ICD device [x]  No []  Yes   Spinal Cord Stimulator:[x]  No []  Yes       History of Sleep Apnea? []  No [x]  Yes   CPAP used?- []  No [x]  Yes    Patient has: []  NO Hx DM   [x]  Pre-DM   []  DM1  []   DM2 Does the patient monitor blood sugar?   []  N/A   [x]  No []  Yes  Last A1c was:  6.1 on   09-17-23   no meds  Blood Thinner / Instructions: Eliquis , hold x 48 hrs, ok'd w/ cardiology  last dose 11-26-23 Friday Aspirin  Instructions:  ERAS Protocol Ordered: []  No  [x]  Yes PRE-SURGERY []  ENSURE  [x]  G2  Patient is to be NPO after: 10:00  Dental hx: []  Dentures:  []  N/A      []  Bridge or Partial:                   []  Loose or Damaged teeth:   Comments:   Activity level: Able to walk up 2 flights of stairs without becoming significantly short of breath or having chest pain?  []  No   []    Yes  Patient can perform ADLs without assistance. []  No   []   Yes  Anesthesia review: A.fib (ablation 08-25-2016), CKD2, HTN, CHF, Pre-DM no meds, Hx CVA, has Medtronic ILR placed 10-2019 Left Chest  Patient denies any S&S of respiratory illness or Covid - no shortness of breath, fever, cough or chest pain at PAT appointment.  Patient verbalized understanding and agreement to the Pre-Surgical Instructions that were given to them at this PAT appointment. Patient was also educated of the need to review these PAT instructions again prior to his surgery.I reviewed the appropriate phone numbers to call if they have any and questions or concerns.

## 2023-11-17 ENCOUNTER — Encounter (HOSPITAL_COMMUNITY): Payer: Self-pay

## 2023-11-17 ENCOUNTER — Encounter (HOSPITAL_COMMUNITY)
Admission: RE | Admit: 2023-11-17 | Discharge: 2023-11-17 | Disposition: A | Discharge status: Home / Self Care | Source: Ambulatory Visit | Attending: General Surgery | Admitting: General Surgery

## 2023-11-17 ENCOUNTER — Other Ambulatory Visit: Payer: Self-pay

## 2023-11-17 VITALS — BP 121/65 | HR 54 | Temp 97.9°F | Resp 16 | Ht 71.0 in | Wt 225.0 lb

## 2023-11-17 DIAGNOSIS — K311 Adult hypertrophic pyloric stenosis: Secondary | ICD-10-CM | POA: Insufficient documentation

## 2023-11-17 DIAGNOSIS — N189 Chronic kidney disease, unspecified: Secondary | ICD-10-CM | POA: Insufficient documentation

## 2023-11-17 DIAGNOSIS — I509 Heart failure, unspecified: Secondary | ICD-10-CM | POA: Insufficient documentation

## 2023-11-17 DIAGNOSIS — G4733 Obstructive sleep apnea (adult) (pediatric): Secondary | ICD-10-CM | POA: Insufficient documentation

## 2023-11-17 DIAGNOSIS — Z87891 Personal history of nicotine dependence: Secondary | ICD-10-CM | POA: Insufficient documentation

## 2023-11-17 DIAGNOSIS — Z7901 Long term (current) use of anticoagulants: Secondary | ICD-10-CM | POA: Diagnosis not present

## 2023-11-17 DIAGNOSIS — I130 Hypertensive heart and chronic kidney disease with heart failure and stage 1 through stage 4 chronic kidney disease, or unspecified chronic kidney disease: Secondary | ICD-10-CM | POA: Diagnosis not present

## 2023-11-17 DIAGNOSIS — Z8673 Personal history of transient ischemic attack (TIA), and cerebral infarction without residual deficits: Secondary | ICD-10-CM | POA: Insufficient documentation

## 2023-11-17 DIAGNOSIS — I2510 Atherosclerotic heart disease of native coronary artery without angina pectoris: Secondary | ICD-10-CM | POA: Insufficient documentation

## 2023-11-17 DIAGNOSIS — Z01812 Encounter for preprocedural laboratory examination: Secondary | ICD-10-CM | POA: Insufficient documentation

## 2023-11-17 DIAGNOSIS — K449 Diaphragmatic hernia without obstruction or gangrene: Secondary | ICD-10-CM | POA: Diagnosis not present

## 2023-11-17 DIAGNOSIS — Z95818 Presence of other cardiac implants and grafts: Secondary | ICD-10-CM | POA: Insufficient documentation

## 2023-11-17 DIAGNOSIS — I4819 Other persistent atrial fibrillation: Secondary | ICD-10-CM | POA: Diagnosis not present

## 2023-11-17 DIAGNOSIS — Z01818 Encounter for other preprocedural examination: Secondary | ICD-10-CM

## 2023-11-17 HISTORY — DX: Essential (primary) hypertension: I10

## 2023-11-17 HISTORY — DX: Unspecified osteoarthritis, unspecified site: M19.90

## 2023-11-17 HISTORY — DX: Cardiac arrhythmia, unspecified: I49.9

## 2023-11-17 HISTORY — DX: Atherosclerotic heart disease of native coronary artery without angina pectoris: I25.10

## 2023-11-17 LAB — CBC
HCT: 44.6 % (ref 39.0–52.0)
Hemoglobin: 14.7 g/dL (ref 13.0–17.0)
MCH: 30.3 pg (ref 26.0–34.0)
MCHC: 33 g/dL (ref 30.0–36.0)
MCV: 92 fL (ref 80.0–100.0)
Platelets: 218 K/uL (ref 150–400)
RBC: 4.85 MIL/uL (ref 4.22–5.81)
RDW: 13.2 % (ref 11.5–15.5)
WBC: 7.4 K/uL (ref 4.0–10.5)
nRBC: 0 % (ref 0.0–0.2)

## 2023-11-17 LAB — BASIC METABOLIC PANEL WITH GFR
Anion gap: 12 (ref 5–15)
BUN: 15 mg/dL (ref 8–23)
CO2: 24 mmol/L (ref 22–32)
Calcium: 8.9 mg/dL (ref 8.9–10.3)
Chloride: 104 mmol/L (ref 98–111)
Creatinine, Ser: 0.91 mg/dL (ref 0.61–1.24)
GFR, Estimated: >60 mL/min (ref >60)
Glucose, Bld: 106 mg/dL — ABNORMAL HIGH (ref 70–99)
Potassium: 4.1 mmol/L (ref 3.5–5.1)
Sodium: 140 mmol/L (ref 135–145)

## 2023-11-18 NOTE — Progress Notes (Signed)
 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 former smoker with h/o HTN, sleep apnea uses CPAP, atrial fibrillation s/p ablation, stroke, loop recorder in place, CHF, CKD, periesophageal hiatal hernia scheduled for above procedure 11/29/2023 with Dr. Herlene Bureau.   Per cardiology preoperative evaluation 10/19/2023, Chart reviewed as part of pre-operative protocol coverage. Given past medical history and time since last visit, based on ACC/AHA guidelines, Austin Taylor would be at acceptable risk for the planned procedure without further cardiovascular testing.    His RCRI is moderate risk, 6.6% risk of major cardiac event.  He is able to complete greater than 4 METS of physical activity.   Patient was advised that if he develops new symptoms prior to surgery to contact our office to arrange a follow-up appointment.  He verbalized understanding.   Per office protocol, patient can hold Eliquis  for 2 days prior to procedure .   VS: BP 121/65 Comment: right arm sitting  Pulse (!) 54   Temp 36.6 C (Oral)   Resp 16   Ht 5' 11 (1.803 m)   Wt 102.1 kg   SpO2 98%   BMI 31.38 kg/m   PROVIDERS: Alben Therisa KANDICE, PA is PCP   Cardiologist - Vina Gull, MD  LABS: Labs reviewed: Acceptable for surgery. (all labs ordered are listed, but only abnormal results are displayed)  Labs Reviewed  BASIC METABOLIC PANEL WITH GFR - Abnormal; Notable for the following components:      Result Value   Glucose, Bld 106 (*)    All other components within normal limits  CBC     IMAGES:   EKG:   CV: Echo 11/10/22  1. Left ventricular ejection fraction,  by estimation, is 70 to 75%. The  left ventricle has hyperdynamic function. The left ventricle has no  regional wall motion abnormalities. There is moderate asymmetric left  ventricular hypertrophy of the septal  segment. Left ventricular diastolic parameters are indeterminate.   2. Right ventricular systolic function is hyperdynamic. The right  ventricular size is normal.   3. The mitral valve is grossly normal. Trivial mitral valve  regurgitation. No evidence of mitral stenosis.   4. The aortic valve is tricuspid. There is mild thickening of the aortic  valve. Aortic valve regurgitation is not visualized. No aortic stenosis is  present.   5. The inferior vena cava is dilated in size with >50% respiratory  variability, suggesting right atrial pressure of 8 mmHg.  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 INVASIVE CV LAB;  Service: Cardiovascular;  Laterality: N/A;   CARDIOVERSION N/A 09/09/2015   Procedure: CARDIOVERSION;  Surgeon: Vina LULLA Gull, MD;  Location: [facility] ENDOSCOPY;  Service: Cardiovascular;  Laterality: N/A;   CARDIOVERSION N/A 11/20/2015   Procedure: CARDIOVERSION;  Surgeon: Vinie JAYSON Maxcy, MD;  Location: [facility] ENDOSCOPY;  Service: Cardiovascular;  Laterality: N/A;   EYE SURGERY Bilateral 2024   cataract extraction   implantable loop recorder placement  10/24/2019    Medtronic Reveal Linq model [id number] (SN # [id number] ) implantable loop recorder implanted.  old device (RRT) removed   LOOP RECORDER INSERTION N/A 04/21/2016   Procedure: Loop Recorder Insertion;  Surgeon: Agent Kelsie, MD;  Location: [facility] INVASIVE CV LAB;  Service: Cardiovascular;  Laterality: N/A;   MOHS SURGERY     ear   SHOULDER OPEN ROTATOR CUFF REPAIR Left 1998   TEE WITHOUT CARDIOVERSION N/A 08/25/2016   Procedure: TRANSESOPHAGEAL ECHOCARDIOGRAM (TEE);  Surgeon: Jeffrie Oneil JAYSON, MD;  Location: [facility] ENDOSCOPY;  Service: Cardiovascular;  Laterality: N/A;    MEDICATIONS:  acetaminophen  (TYLENOL ) 650 MG CR tablet   ELIQUIS  5 MG TABS tablet   ferrous sulfate 325 (65 FE) MG tablet   furosemide  (LASIX ) 40 MG tablet   NON FORMULARY   omeprazole  (PRILOSEC) 20 MG capsule   Probiotic Product (PROBIOTIC PO)   sildenafil (VIAGRA) 100 MG tablet   simvastatin  (ZOCOR ) 40 MG tablet   No current facility-administered medications for this encounter.    Harlene Hoots Ward, PA-C [facility] Pre-Surgical Testing [phone]

## 2023-11-18 NOTE — Anesthesia Preprocedure Evaluation (Addendum)
 Anesthesia Evaluation  Patient identified by MRN, date of birth, ID band Patient awake    Reviewed: Allergy & Precautions, NPO status , Patient's Chart, lab work & pertinent test results  Airway Mallampati: III  TM Distance: <3 FB Neck ROM: Limited    Dental  (+) Dental Advisory Given, Teeth Intact   Pulmonary sleep apnea and Continuous Positive Airway Pressure Ventilation , Patient abstained from smoking., former smoker   Pulmonary exam normal breath sounds clear to auscultation       Cardiovascular hypertension, Pt. on medications + CAD and +CHF  Normal cardiovascular exam+ dysrhythmias Atrial Fibrillation  Rhythm:Regular Rate:Normal  Echo 10/2022  1. Left ventricular ejection fraction, by estimation, is 70 to 75%. The  left ventricle has hyperdynamic function. The left ventricle has no  regional wall motion abnormalities. There is moderate asymmetric left  ventricular hypertrophy of the septal  segment. Left ventricular diastolic parameters are indeterminate.   2. Right ventricular systolic function is hyperdynamic. The right  ventricular size is normal.   3. The mitral valve is grossly normal. Trivial mitral valve  regurgitation. No evidence of mitral stenosis.   4. The aortic valve is tricuspid. There is mild thickening of the aortic  valve. Aortic valve regurgitation is not visualized. No aortic stenosis is  present.   5. The inferior vena cava is dilated in size with >50% respiratory  variability, suggesting right atrial pressure of 8 mmHg.   Comparison(s): No prior Echocardiogram.      Neuro/Psych CVA    GI/Hepatic hiatal hernia,GERD  Medicated,,  Endo/Other  negative endocrine ROS    Renal/GU Renal InsufficiencyRenal disease     Musculoskeletal  (+) Arthritis ,    Abdominal  (+) + obese  Peds  Hematology negative hematology ROS (+)   Anesthesia Other Findings   Reproductive/Obstetrics                               Anesthesia Physical Anesthesia Plan  ASA: 3  Anesthesia Plan: General   Post-op Pain Management: Tylenol  PO (pre-op)*   Induction: Intravenous  PONV Risk Score and Plan: 4 or greater and Ondansetron , Dexamethasone  and Treatment may vary due to age or medical condition  Airway Management Planned: Oral ETT  Additional Equipment:   Intra-op Plan:   Post-operative Plan: Extubation in OR  Informed Consent: I have reviewed the patients History and Physical, chart, labs and discussed the procedure including the risks, benefits and alternatives for the proposed anesthesia with the patient or authorized representative who has indicated his/her understanding and acceptance.     Dental advisory given  Plan Discussed with: CRNA  Anesthesia Plan Comments: (See PAT note 11/29/2023)         Anesthesia Quick Evaluation

## 2023-11-26 ENCOUNTER — Ambulatory Visit: Payer: Medicare Other

## 2023-11-29 ENCOUNTER — Ambulatory Visit (HOSPITAL_BASED_OUTPATIENT_CLINIC_OR_DEPARTMENT_OTHER): Payer: Self-pay | Admitting: Anesthesiology

## 2023-11-29 ENCOUNTER — Other Ambulatory Visit: Payer: Self-pay

## 2023-11-29 ENCOUNTER — Observation Stay (HOSPITAL_COMMUNITY)
Admission: RE | Admit: 2023-11-29 | Discharge: 2023-11-30 | Disposition: A | Discharge status: Home / Self Care | Source: Ambulatory Visit | Attending: General Surgery | Admitting: General Surgery

## 2023-11-29 ENCOUNTER — Encounter (HOSPITAL_COMMUNITY)
Admission: RE | Disposition: A | Discharge status: Home / Self Care | Payer: Self-pay | Source: Ambulatory Visit | Attending: General Surgery

## 2023-11-29 ENCOUNTER — Encounter (HOSPITAL_COMMUNITY): Payer: Self-pay | Admitting: General Surgery

## 2023-11-29 ENCOUNTER — Ambulatory Visit (HOSPITAL_COMMUNITY): Payer: Self-pay | Admitting: Physician Assistant

## 2023-11-29 DIAGNOSIS — K440 Diaphragmatic hernia with obstruction, without gangrene: Secondary | ICD-10-CM | POA: Diagnosis not present

## 2023-11-29 DIAGNOSIS — N182 Chronic kidney disease, stage 2 (mild): Secondary | ICD-10-CM

## 2023-11-29 DIAGNOSIS — I130 Hypertensive heart and chronic kidney disease with heart failure and stage 1 through stage 4 chronic kidney disease, or unspecified chronic kidney disease: Secondary | ICD-10-CM

## 2023-11-29 DIAGNOSIS — Z87891 Personal history of nicotine dependence: Secondary | ICD-10-CM | POA: Diagnosis not present

## 2023-11-29 DIAGNOSIS — Z8673 Personal history of transient ischemic attack (TIA), and cerebral infarction without residual deficits: Secondary | ICD-10-CM | POA: Insufficient documentation

## 2023-11-29 DIAGNOSIS — I5032 Chronic diastolic (congestive) heart failure: Secondary | ICD-10-CM | POA: Diagnosis not present

## 2023-11-29 DIAGNOSIS — I2510 Atherosclerotic heart disease of native coronary artery without angina pectoris: Secondary | ICD-10-CM | POA: Insufficient documentation

## 2023-11-29 DIAGNOSIS — G4733 Obstructive sleep apnea (adult) (pediatric): Secondary | ICD-10-CM | POA: Diagnosis not present

## 2023-11-29 DIAGNOSIS — K449 Diaphragmatic hernia without obstruction or gangrene: Secondary | ICD-10-CM

## 2023-11-29 DIAGNOSIS — I110 Hypertensive heart disease with heart failure: Secondary | ICD-10-CM | POA: Diagnosis not present

## 2023-11-29 DIAGNOSIS — Z79899 Other long term (current) drug therapy: Secondary | ICD-10-CM | POA: Diagnosis not present

## 2023-11-29 DIAGNOSIS — R5381 Other malaise: Secondary | ICD-10-CM | POA: Diagnosis not present

## 2023-11-29 DIAGNOSIS — I480 Paroxysmal atrial fibrillation: Secondary | ICD-10-CM | POA: Diagnosis not present

## 2023-11-29 DIAGNOSIS — I509 Heart failure, unspecified: Secondary | ICD-10-CM | POA: Diagnosis not present

## 2023-11-29 HISTORY — PX: LAPAROSCOPIC GASTROSTOMY: SHX5896

## 2023-11-29 HISTORY — PX: XI ROBOTIC ASSISTED HIATAL HERNIA REPAIR: SHX6889

## 2023-11-29 LAB — CBC
HCT: 45.2 % (ref 39.0–52.0)
Hemoglobin: 14.1 g/dL (ref 13.0–17.0)
MCH: 29.4 pg (ref 26.0–34.0)
MCHC: 31.2 g/dL (ref 30.0–36.0)
MCV: 94.2 fL (ref 80.0–100.0)
Platelets: 238 K/uL (ref 150–400)
RBC: 4.8 MIL/uL (ref 4.22–5.81)
RDW: 13.3 % (ref 11.5–15.5)
WBC: 15.9 K/uL — ABNORMAL HIGH (ref 4.0–10.5)
nRBC: 0 % (ref 0.0–0.2)

## 2023-11-29 LAB — CREATININE, SERUM
Creatinine, Ser: 1 mg/dL (ref 0.61–1.24)
GFR, Estimated: >60 mL/min (ref >60)

## 2023-11-29 SURGERY — REPAIR, HERNIA, HIATAL, ROBOT-ASSISTED
Anesthesia: General | Site: Abdomen

## 2023-11-29 MED ORDER — PROPOFOL 10 MG/ML IV BOLUS
INTRAVENOUS | Status: DC | PRN
  Administered 2023-11-29: 100 mg via INTRAVENOUS

## 2023-11-29 MED ORDER — FENTANYL CITRATE (PF) 100 MCG/2ML IJ SOLN
INTRAMUSCULAR | Status: AC
  Filled 2023-11-29: qty 2

## 2023-11-29 MED ORDER — ACETAMINOPHEN 325 MG PO TABS
650.0000 mg | ORAL_TABLET | Freq: Four times a day (QID) | ORAL | Status: DC | PRN
  Administered 2023-11-30: 650 mg via ORAL
  Filled 2023-11-29: qty 2

## 2023-11-29 MED ORDER — TRAMADOL HCL 50 MG PO TABS: 50.0000 mg | ORAL_TABLET | Freq: Four times a day (QID) | ORAL | Status: DC | PRN

## 2023-11-29 MED ORDER — PHENYLEPHRINE HCL (PRESSORS) 10 MG/ML IV SOLN
INTRAVENOUS | Status: DC | PRN
  Administered 2023-11-29: 160 ug via INTRAVENOUS
  Administered 2023-11-29: 80 ug via INTRAVENOUS

## 2023-11-29 MED ORDER — ONDANSETRON HCL 4 MG/2ML IJ SOLN: 4.0000 mg | Freq: Four times a day (QID) | INTRAMUSCULAR | Status: DC | PRN

## 2023-11-29 MED ORDER — SIMETHICONE 80 MG PO CHEW
80.0000 mg | CHEWABLE_TABLET | Freq: Four times a day (QID) | ORAL | Status: DC | PRN
  Administered 2023-11-29: 80 mg via ORAL
  Filled 2023-11-29: qty 1

## 2023-11-29 MED ORDER — METOPROLOL TARTRATE 5 MG/5ML IV SOLN: 5.0000 mg | Freq: Four times a day (QID) | INTRAVENOUS | Status: DC | PRN

## 2023-11-29 MED ORDER — LACTATED RINGERS IV SOLN: INTRAVENOUS | Status: DC | PRN

## 2023-11-29 MED ORDER — DEXTROSE-SODIUM CHLORIDE 5-0.45 % IV SOLN: INTRAVENOUS | Status: DC

## 2023-11-29 MED ORDER — ROCURONIUM BROMIDE 100 MG/10ML IV SOLN
INTRAVENOUS | Status: DC | PRN
  Administered 2023-11-29 (×2): 10 mg via INTRAVENOUS
  Administered 2023-11-29: 20 mg via INTRAVENOUS
  Administered 2023-11-29: 50 mg via INTRAVENOUS

## 2023-11-29 MED ORDER — FENTANYL CITRATE (PF) 100 MCG/2ML IJ SOLN
INTRAMUSCULAR | Status: DC | PRN
  Administered 2023-11-29 (×4): 50 ug via INTRAVENOUS

## 2023-11-29 MED ORDER — CHLORHEXIDINE GLUCONATE CLOTH 2 % EX PADS: 6.0000 | MEDICATED_PAD | Freq: Once | CUTANEOUS | Status: DC

## 2023-11-29 MED ORDER — ENSURE PRE-SURGERY PO LIQD: 296.0000 mL | Freq: Once | ORAL | Status: DC

## 2023-11-29 MED ORDER — DEXAMETHASONE SODIUM PHOSPHATE 10 MG/ML IJ SOLN
INTRAMUSCULAR | Status: DC | PRN
  Administered 2023-11-29: 8 mg via INTRAVENOUS

## 2023-11-29 MED ORDER — SUGAMMADEX SODIUM 200 MG/2ML IV SOLN
INTRAVENOUS | Status: DC | PRN
  Administered 2023-11-29: 200 mg via INTRAVENOUS

## 2023-11-29 MED ORDER — DROPERIDOL 2.5 MG/ML IJ SOLN: 0.6250 mg | Freq: Once | INTRAMUSCULAR | Status: DC | PRN

## 2023-11-29 MED ORDER — LACTATED RINGERS IV SOLN: INTRAVENOUS | Status: DC

## 2023-11-29 MED ORDER — HYDROMORPHONE HCL 1 MG/ML IJ SOLN
0.2500 mg | INTRAMUSCULAR | Status: DC | PRN
  Administered 2023-11-29 (×2): 0.5 mg via INTRAVENOUS

## 2023-11-29 MED ORDER — ONDANSETRON 4 MG PO TBDP: 4.0000 mg | ORAL_TABLET | Freq: Four times a day (QID) | ORAL | Status: DC | PRN

## 2023-11-29 MED ORDER — LIDOCAINE HCL (PF) 2 % IJ SOLN
INTRAMUSCULAR | Status: AC
  Filled 2023-11-29: qty 5

## 2023-11-29 MED ORDER — ONDANSETRON HCL 4 MG/2ML IJ SOLN
INTRAMUSCULAR | Status: DC | PRN
  Administered 2023-11-29: 4 mg via INTRAVENOUS

## 2023-11-29 MED ORDER — ENOXAPARIN SODIUM 40 MG/0.4ML IJ SOSY
40.0000 mg | PREFILLED_SYRINGE | INTRAMUSCULAR | Status: DC
  Administered 2023-11-30: 40 mg via SUBCUTANEOUS
  Filled 2023-11-29: qty 0.4

## 2023-11-29 MED ORDER — PHENYLEPHRINE HCL-NACL 20-0.9 MG/250ML-% IV SOLN
INTRAVENOUS | Status: DC | PRN
  Administered 2023-11-29: 20 ug/min via INTRAVENOUS

## 2023-11-29 MED ORDER — HYDROMORPHONE HCL 1 MG/ML IJ SOLN
INTRAMUSCULAR | Status: AC
  Filled 2023-11-29: qty 1

## 2023-11-29 MED ORDER — ORAL CARE MOUTH RINSE: 15.0000 mL | Freq: Once | OROMUCOSAL | Status: AC

## 2023-11-29 MED ORDER — LIDOCAINE HCL (CARDIAC) PF 100 MG/5ML IV SOSY
PREFILLED_SYRINGE | INTRAVENOUS | Status: DC | PRN
  Administered 2023-11-29: 50 mg via INTRAVENOUS

## 2023-11-29 MED ORDER — LACTATED RINGERS IR SOLN
Status: DC | PRN
  Administered 2023-11-29: 1000 mL

## 2023-11-29 MED ORDER — BUPIVACAINE-EPINEPHRINE 0.25% -1:200000 IJ SOLN
INTRAMUSCULAR | Status: DC | PRN
  Administered 2023-11-29: 30 mL

## 2023-11-29 MED ORDER — DIPHENHYDRAMINE HCL 50 MG/ML IJ SOLN: 12.5000 mg | Freq: Four times a day (QID) | INTRAMUSCULAR | Status: DC | PRN

## 2023-11-29 MED ORDER — ONDANSETRON HCL 4 MG/2ML IJ SOLN
INTRAMUSCULAR | Status: AC
  Filled 2023-11-29: qty 2

## 2023-11-29 MED ORDER — OXYCODONE HCL 5 MG PO TABS: 5.0000 mg | ORAL_TABLET | ORAL | Status: DC | PRN

## 2023-11-29 MED ORDER — GLYCOPYRROLATE 0.2 MG/ML IJ SOLN
INTRAMUSCULAR | Status: DC | PRN
  Administered 2023-11-29: .2 mg via INTRAVENOUS

## 2023-11-29 MED ORDER — DEXAMETHASONE SODIUM PHOSPHATE 10 MG/ML IJ SOLN
INTRAMUSCULAR | Status: AC
  Filled 2023-11-29: qty 1

## 2023-11-29 MED ORDER — ACETAMINOPHEN 500 MG PO TABS
1000.0000 mg | ORAL_TABLET | ORAL | Status: AC
  Administered 2023-11-29: 1000 mg via ORAL
  Filled 2023-11-29: qty 2

## 2023-11-29 MED ORDER — ROCURONIUM BROMIDE 10 MG/ML (PF) SYRINGE
PREFILLED_SYRINGE | INTRAVENOUS | Status: AC
  Filled 2023-11-29: qty 10

## 2023-11-29 MED ORDER — BUPIVACAINE-EPINEPHRINE (PF) 0.25% -1:200000 IJ SOLN
INTRAMUSCULAR | Status: AC
  Filled 2023-11-29: qty 30

## 2023-11-29 MED ORDER — DIPHENHYDRAMINE HCL 12.5 MG/5ML PO ELIX: 12.5000 mg | ORAL_SOLUTION | Freq: Four times a day (QID) | ORAL | Status: DC | PRN

## 2023-11-29 MED ORDER — CEFAZOLIN SODIUM-DEXTROSE 2-4 GM/100ML-% IV SOLN
2.0000 g | INTRAVENOUS | Status: AC
  Administered 2023-11-29: 2 g via INTRAVENOUS
  Filled 2023-11-29: qty 100

## 2023-11-29 MED ORDER — ACETAMINOPHEN 650 MG RE SUPP: 650.0000 mg | Freq: Four times a day (QID) | RECTAL | Status: DC | PRN

## 2023-11-29 MED ORDER — POLYETHYLENE GLYCOL 3350 17 G PO PACK: 17.0000 g | PACK | Freq: Every day | ORAL | Status: DC | PRN

## 2023-11-29 MED ORDER — ENSURE SURGERY PO LIQD
237.0000 mL | Freq: Two times a day (BID) | ORAL | Status: DC
  Administered 2023-11-30: 237 mL via ORAL

## 2023-11-29 MED ORDER — CHLORHEXIDINE GLUCONATE 0.12 % MT SOLN
15.0000 mL | Freq: Once | OROMUCOSAL | Status: AC
  Administered 2023-11-29: 15 mL via OROMUCOSAL

## 2023-11-29 MED ORDER — HYDROMORPHONE HCL 2 MG/ML IJ SOLN
INTRAMUSCULAR | Status: AC
  Filled 2023-11-29: qty 1

## 2023-11-29 MED ORDER — PANTOPRAZOLE SODIUM 40 MG IV SOLR
40.0000 mg | Freq: Every day | INTRAVENOUS | Status: DC
  Administered 2023-11-29: 40 mg via INTRAVENOUS
  Filled 2023-11-29: qty 10

## 2023-11-29 MED ORDER — HYDROMORPHONE HCL 1 MG/ML IJ SOLN: 0.5000 mg | INTRAMUSCULAR | Status: DC | PRN

## 2023-11-29 MED ORDER — HYDROMORPHONE HCL 1 MG/ML IJ SOLN
INTRAMUSCULAR | Status: DC | PRN
  Administered 2023-11-29: .4 mg via INTRAVENOUS

## 2023-11-29 SURGICAL SUPPLY — 66 items
BAG COUNTER SPONGE SURGICOUNT (BAG) ×1 IMPLANT
BLADE SURG SZ11 CARB STEEL (BLADE) ×1 IMPLANT
CABLE HIGH FREQUENCY MONO STRZ (ELECTRODE) ×1 IMPLANT
CHLORAPREP W/TINT 26 (MISCELLANEOUS) ×1 IMPLANT
COVER SURGICAL LIGHT HANDLE (MISCELLANEOUS) ×1 IMPLANT
COVER TIP SHEARS 8 DVNC (MISCELLANEOUS) IMPLANT
DERMABOND ADVANCED .7 DNX12 (GAUZE/BANDAGES/DRESSINGS) ×1 IMPLANT
DRAIN CHANNEL 19F RND (DRAIN) IMPLANT
DRAIN PENROSE 0.5X18 (DRAIN) IMPLANT
DRAPE ARM DVNC X/XI (DISPOSABLE) ×4 IMPLANT
DRAPE COLUMN DVNC XI (DISPOSABLE) ×1 IMPLANT
DRAPE SURG ORHT 6 SPLT 77X108 (DRAPES) ×1 IMPLANT
DRIVER NDL LRG 8 DVNC XI (INSTRUMENTS) ×1 IMPLANT
DRIVER NDL MEGA SUTCUT DVNCXI (INSTRUMENTS) ×1 IMPLANT
DRIVER NDLE LRG 8 DVNC XI (INSTRUMENTS) ×1 IMPLANT
DRIVER NDLE MEGA SUTCUT DVNCXI (INSTRUMENTS) ×1 IMPLANT
ELECT REM PT RETURN 15FT ADLT (MISCELLANEOUS) ×1 IMPLANT
EVACUATOR SILICONE 100CC (DRAIN) IMPLANT
FORCEPS CADIERE DVNC XI (FORCEP) ×1 IMPLANT
G-TUBE MIC 18FR ENFIT ADLT (TUBING) IMPLANT
G-TUBE MIC BOLUS 18FR ENFIT (TUBING) IMPLANT
GAUZE 4X4 16PLY ~~LOC~~+RFID DBL (SPONGE) ×1 IMPLANT
GAUZE SPONGE 4X4 12PLY STRL (GAUZE/BANDAGES/DRESSINGS) IMPLANT
GLOVE BIOGEL PI IND STRL 7.0 (GLOVE) ×2 IMPLANT
GLOVE SURG SS PI 7.0 STRL IVOR (GLOVE) ×2 IMPLANT
GOWN STRL REUS W/ TWL LRG LVL3 (GOWN DISPOSABLE) ×2 IMPLANT
GRASPER SUT TROCAR 14GX15 (MISCELLANEOUS) ×1 IMPLANT
GRASPER TIP-UP FEN DVNC XI (INSTRUMENTS) ×1 IMPLANT
IRRIGATION SUCT STRKRFLW 2 WTP (MISCELLANEOUS) ×1 IMPLANT
KIT BASIN OR (CUSTOM PROCEDURE TRAY) ×1 IMPLANT
KIT INTRO ENDO 22F F/GAST TUBE (SET/KITS/TRAYS/PACK) IMPLANT
KIT TURNOVER KIT A (KITS) ×1 IMPLANT
LUBRICANT JELLY K Y 4OZ (MISCELLANEOUS) IMPLANT
MARKER SKIN DUAL TIP RULER LAB (MISCELLANEOUS) ×1 IMPLANT
NDL HYPO 22X1.5 SAFETY MO (MISCELLANEOUS) ×1 IMPLANT
NEEDLE HYPO 22X1.5 SAFETY MO (MISCELLANEOUS) ×1 IMPLANT
PACK CARDIOVASCULAR III (CUSTOM PROCEDURE TRAY) ×1 IMPLANT
PENCIL SMOKE EVACUATOR (MISCELLANEOUS) IMPLANT
SCISSORS LAP 5X35 DISP (ENDOMECHANICALS) ×1 IMPLANT
SCISSORS MNPLR CVD DVNC XI (INSTRUMENTS) ×1 IMPLANT
SEAL UNIV 5-12 XI (MISCELLANEOUS) ×3 IMPLANT
SEALER VESSEL EXT DVNC XI (MISCELLANEOUS) ×1 IMPLANT
SET TUBE SMOKE EVAC HIGH FLOW (TUBING) ×1 IMPLANT
SHEARS HARMONIC 36 ACE (MISCELLANEOUS) IMPLANT
SLEEVE Z-THREAD 5X100MM (TROCAR) ×2 IMPLANT
SOLUTION ANTFG W/FOAM PAD STRL (MISCELLANEOUS) ×1 IMPLANT
SOLUTION ELECTROSURG ANTI STCK (MISCELLANEOUS) IMPLANT
SPIKE FLUID TRANSFER (MISCELLANEOUS) ×1 IMPLANT
SPONGE DRAIN TRACH 4X4 STRL 2S (GAUZE/BANDAGES/DRESSINGS) ×1 IMPLANT
SUT ETHIBOND 0 36 GRN (SUTURE) ×2 IMPLANT
SUT ETHILON 2 0 PS N (SUTURE) IMPLANT
SUT MNCRL AB 4-0 PS2 18 (SUTURE) ×1 IMPLANT
SUT SILK 0 SH 30 (SUTURE) IMPLANT
SUT SILK 2 0 SH (SUTURE) ×3 IMPLANT
SUT SILK 3 0 SH 30 (SUTURE) IMPLANT
SYR 20ML LL LF (SYRINGE) ×2 IMPLANT
TIP INNERVISION DETACH 40FR (MISCELLANEOUS) IMPLANT
TIP INNERVISION DETACH 50FR (MISCELLANEOUS) IMPLANT
TIP INNERVISION DETACH 56FR (MISCELLANEOUS) IMPLANT
TOWEL GREEN STERILE FF (TOWEL DISPOSABLE) ×1 IMPLANT
TOWEL OR 17X26 10 PK STRL BLUE (TOWEL DISPOSABLE) ×1 IMPLANT
TRAY FOLEY MTR SLVR 16FR STAT (SET/KITS/TRAYS/PACK) IMPLANT
TRAY LAPAROSCOPIC (CUSTOM PROCEDURE TRAY) ×1 IMPLANT
TROCAR Z-THREAD BLADED 11X100M (TROCAR) ×1 IMPLANT
TROCAR Z-THREAD OPTICAL 5X100M (TROCAR) ×1 IMPLANT
TUBE GASTRO BOLUS 18FR ENFIT (TUBING) IMPLANT

## 2023-11-29 NOTE — Transfer of Care (Signed)
 Immediate Anesthesia Transfer of Care Note  Patient: Austin Taylor  Procedure(s) Performed: REPAIR, HERNIA, HIATAL, ROBOT-ASSISTED (Abdomen) CREATION, GASTROSTOMY, LAPAROSCOPIC (Abdomen)  Patient Location: PACU  Anesthesia Type:General  Level of Consciousness: awake  Airway & Oxygen Therapy: Patient Spontanous Breathing and Patient connected to face mask oxygen  Post-op Assessment: Report given to RN and Post -op Vital signs reviewed and stable  Post vital signs: Reviewed and stable  Last Vitals:  Vitals Value Taken Time  BP 143/73   Temp    Pulse 78 11/29/23 18:43  Resp 20 11/29/23 18:43  SpO2 95 % 11/29/23 18:43  Vitals shown include unfiled device data.  Last Pain:  Vitals:   11/29/23 1158  TempSrc:   PainSc: 0-No pain      Patients Stated Pain Goal: 4 (11/29/23 1158)  Complications: No notable events documented.

## 2023-11-29 NOTE — Anesthesia Procedure Notes (Signed)
 Procedure Name: Intubation Date/Time: 11/29/2023 3:31 PM  Performed by: Deeann Eva BROCKS, CRNAPre-anesthesia Checklist: Patient identified, Emergency Drugs available, Suction available and Patient being monitored Patient Re-evaluated:Patient Re-evaluated prior to induction Oxygen Delivery Method: Circle System Utilized Preoxygenation: Pre-oxygenation with 100% oxygen Induction Type: IV induction Ventilation: Mask ventilation without difficulty Laryngoscope Size: Mac and 3 Grade View: Grade III Tube type: Oral Tube size: 8.0 mm Number of attempts: 1 Airway Equipment and Method: Stylet Placement Confirmation: ETT inserted through vocal cords under direct vision, positive ETCO2 and breath sounds checked- equal and bilateral Secured at: 23 cm Tube secured with: Tape Dental Injury: Teeth and Oropharynx as per pre-operative assessment

## 2023-11-29 NOTE — Op Note (Signed)
 Preoperative diagnosis: giant paraesophageal hernia  Postoperative diagnosis: same   Procedure: robotic paraesophageal hernia repair, laparoscopic placement of g tube  Surgeon: Herlene Bureau, M.D.  Asst: Lynda Leos, M.D.  Anesthesia: general  Indications for procedure: Austin Taylor is a 83 y.o. year old male with symptoms of obstructing hiatal hernia that improved with NG decompression. He presents for repair.  Description of procedure: The patient was brought into the operative suite. Anesthesia was administered with General endotracheal anesthesia. WHO checklist was applied. The patient was then placed in supine position. The area was prepped and draped in the usual sterile fashion.  Next, a left mid abdominal incision was made. A 5 mm trocar was used to gain access to the peritoneal cavity by optical entry technique. Pneumoperitoneum was applied with a high flow and low pressure. The laparoscope was reinserted to confirm position. An 8 mm trocar was placed in the left mid abdomen and an 8 mm trocar was placed in the left lateral abdomen. An 8 mm trocar was placed in the right mid abdomen. A 5 mm trocar was placed in the right upper abdomen. Bilateral TAP blocks were placed with Marcaine .  A Nathanson retractor was placed in the subxiphoid space and used to retract the left lobe of the liver.  The hiatal hernia appeared large and contained all the stomach and a large portion of the omentum. The pars flaccida was divided with harmonic scalpel The peritoneum of the right crus was divided and the sac separated from the chest contents. This plane was continued anteriorly and to the left crus. Next, the posterior area was dissected free. Additional care was used to dissect attachments to the chest to the sac and esophagus to improve mobility. A penrose was placed around the GE junction for visualization and retraction. The esophagus was completely freed from surrounding attachments. Care was  taken to avoid injury to the vagus nerves. The sac was divided from the GE junction and removed.   The crus was repaired with 6 interrupted 2-0 ethibond sutures showing appropriate sizing of the crus. In addition, 2 anterior sutures were used and 2 sutures of the left crus to the anterior crus moving the esophagus laterally.  G tube: Next, a 2-0 silk was used to make a pursestring in the distal body of the anterior stomach. The robot was undocked. The nathanson retractor was removed. An 18 fr g tube was introduced into the abdomen through the subxiphoid incision. A gastrotomy was made with cautery in the center of the pursestring area. The g tube was easily introduced. The pursestring was tied down. 2 0 silks were used for gastropexy superior and inferior to the g tube and sutured to the fascia with suture passer. The g tube was tightened to 3 cm at the skin.   Hemostasis was inspected and intact. Pneumoperitoneum was removed. All trocars were removed. All incisions were closed with 4-0 monocryl subcuticular suture. Dermabond was placed for dressing. The patient awoke from anesthesia and was brought to pacu in stable condition.   Findings: Giant paraesophageal hernia  Specimen: none  Implant: 18 Fr g tube   Blood loss: 50 ml  Local anesthesia: 30 ml Marcaine   Complications: none  Herlene Bureau, M.D. General, Bariatric, & Minimally Invasive Surgery [facility]

## 2023-11-29 NOTE — H&P (Signed)
 Chief Complaint:   Chief Complaint  Patient presents with  New Consultation   Subjective   Austin Taylor is a 83 y.o. male new patient in today for: History of Present Illness Austin Taylor is an 83 year old male with a hiatal hernia who presents for surgical consultation following a recent hospitalization for inability to keep food down. He is accompanied by a family member, possibly his daughter.  He has experienced similar episodes of food intolerance and pain in the past, which typically resolved spontaneously. Since hospital discharge, he has been managing his symptoms.  He has atrial fibrillation, previously treated with ablation, and is on Eliquis . A loop recorder showed a low burden of atrial fibrillation before its battery expired.  He has sleep apnea and uses a CPAP machine. He experiences shortness of breath, which he attributes to his hiatal hernia.  Social Drivers of Health with Concerns   Tobacco Use: Medium Risk (09/29/2023)  Patient History  Smoking Tobacco Use: Former  Smokeless Tobacco Use: Never  Alcohol Use: Alcohol Misuse (09/29/2023)  AUDIT-C  Frequency of Alcohol Consumption: 2-3 times a week  Average Number of Drinks: 1 or 2  Frequency of Binge Drinking: Less than monthly  Housing Stability: Unknown (09/29/2023)  Housing Stability Vital Sign  Homeless in the Last Year: No    No data to display      Outpatient Medications Prior to Visit  Medication Sig Dispense Refill  ELIQUIS  5 mg tablet Take 5 mg by mouth 2 (two) times daily  ferrous sulfate 325 (65 FE) MG tablet Take 325 mg by mouth  FUROsemide  (LASIX ) 40 MG tablet Take 40 mg by mouth once daily  Lactobacillus acidophilus (PROBIOTIC ORAL) Take 1 tablet by mouth every evening  omeprazole  (PRILOSEC) 20 MG DR capsule Take 20 mg by mouth once daily  sildenafiL (VIAGRA) 100 MG tablet Take 25-50 mg by mouth  simvastatin  (ZOCOR ) 40 MG tablet Take 40 mg by mouth every evening   No  facility-administered medications prior to visit.    Objective   Vitals:  09/29/23 1034  BP: 138/80  Pulse: 75  Temp: 36.6 C (97.9 F)  SpO2: 97%  Weight: (!) 103.1 kg (227 lb 6.4 oz)  Height: 180.3 cm (5' 11)  PainSc: 0-No pain  PainLoc: Abdomen   Body mass index is 31.72 kg/m. Physical Exam   Physical Exam  I reviewed Ct scan showing very large hiatal hernia with obstructive features. I reviewed notes by Charlena Shipper.  Assessment/Plan:   Assessment & Plan Hiatal hernia Large hiatal hernia causing recurrent symptoms, including inability to keep food down and pain. - Proceed with robotic-assisted surgical repair using six small incisions. - Perform dissection to pull stomach down, elongate esophagus, operating behind heart, between lungs, in front of aorta. - Sew diaphragm around esophagus and stomach to esophagus to prevent recurrence. - Insert gastric tube for scar tissue fixation, prevent stomach migration, and provide feeding option if swallowing issues occur post-surgery. - Discussed potential for gas bloat sensation post-surgery and use of gastric tube to relieve it. [facility] stay expected for 1-2 days post-surgery. - Implement liquid diet for 2 weeks post-surgery, followed by easy-to-digest foods for another month, gradual return to normal diet over 8 weeks. - Discussed potential use of acellular mesh if diaphragm muscle appears weak during surgery. - Plan for removal of gastric tube in office at 3 weeks if not needed. - Discussed potential use of Ensure or similar nutritional products if feeding tube is  needed.  Atrial fibrillation Atrial fibrillation with previous ablation and 5% atrial fibrillation burden. Loop recorder battery has died, and no replacement is recommended. - Contact cardiologist to discuss upcoming surgery and need for any updated evaluations, such as echocardiogram. - Discontinue Eliquis  48 hours before and after surgery. - Administer blood  thinner shots during hospital stay post-surgery.  Sleep apnea Sleep apnea requiring CPAP mask use. - Bring CPAP mask to hospital for use during stay. - Coordinate with respiratory therapy to connect CPAP mask to hospital machine.  Diagnoses and all orders for this visit:  Paroxysmal atrial fibrillation (CMS/HHS-HCC)  Paraesophageal hiatal hernia

## 2023-11-29 NOTE — Anesthesia Postprocedure Evaluation (Signed)
 Anesthesia Post Note  Patient: Austin Taylor  Procedure(s) Performed: REPAIR, HERNIA, HIATAL, ROBOT-ASSISTED (Abdomen) CREATION, GASTROSTOMY, LAPAROSCOPIC (Abdomen)     Patient location during evaluation: PACU Anesthesia Type: General Level of consciousness: sedated and patient cooperative Pain management: pain level controlled Vital Signs Assessment: post-procedure vital signs reviewed and stable Respiratory status: spontaneous breathing Cardiovascular status: stable Anesthetic complications: no   No notable events documented.  Last Vitals:  Vitals:   11/29/23 1915 11/29/23 1930  BP: (!) 141/61 138/67  Pulse: 68 69  Resp: 11 13  Temp:  36.7 C  SpO2: 93% 93%    Last Pain:  Vitals:   11/29/23 1930  TempSrc:   PainSc: Asleep                 Norleen Pope

## 2023-11-30 ENCOUNTER — Encounter (HOSPITAL_COMMUNITY): Payer: Self-pay | Admitting: General Surgery

## 2023-11-30 DIAGNOSIS — I480 Paroxysmal atrial fibrillation: Secondary | ICD-10-CM | POA: Diagnosis not present

## 2023-11-30 DIAGNOSIS — Z8673 Personal history of transient ischemic attack (TIA), and cerebral infarction without residual deficits: Secondary | ICD-10-CM | POA: Diagnosis not present

## 2023-11-30 DIAGNOSIS — R5381 Other malaise: Secondary | ICD-10-CM | POA: Diagnosis not present

## 2023-11-30 DIAGNOSIS — G4733 Obstructive sleep apnea (adult) (pediatric): Secondary | ICD-10-CM | POA: Diagnosis not present

## 2023-11-30 DIAGNOSIS — I110 Hypertensive heart disease with heart failure: Secondary | ICD-10-CM | POA: Diagnosis not present

## 2023-11-30 DIAGNOSIS — Z87891 Personal history of nicotine dependence: Secondary | ICD-10-CM | POA: Diagnosis not present

## 2023-11-30 DIAGNOSIS — I2510 Atherosclerotic heart disease of native coronary artery without angina pectoris: Secondary | ICD-10-CM | POA: Diagnosis not present

## 2023-11-30 DIAGNOSIS — I509 Heart failure, unspecified: Secondary | ICD-10-CM | POA: Diagnosis not present

## 2023-11-30 DIAGNOSIS — Z79899 Other long term (current) drug therapy: Secondary | ICD-10-CM | POA: Diagnosis not present

## 2023-11-30 DIAGNOSIS — K440 Diaphragmatic hernia with obstruction, without gangrene: Secondary | ICD-10-CM | POA: Diagnosis not present

## 2023-11-30 LAB — CBC
HCT: 42.7 % (ref 39.0–52.0)
Hemoglobin: 14 g/dL (ref 13.0–17.0)
MCH: 30.2 pg (ref 26.0–34.0)
MCHC: 32.8 g/dL (ref 30.0–36.0)
MCV: 92.2 fL (ref 80.0–100.0)
Platelets: 212 K/uL (ref 150–400)
RBC: 4.63 MIL/uL (ref 4.22–5.81)
RDW: 13.2 % (ref 11.5–15.5)
WBC: 13.4 K/uL — ABNORMAL HIGH (ref 4.0–10.5)
nRBC: 0 % (ref 0.0–0.2)

## 2023-11-30 LAB — BASIC METABOLIC PANEL WITH GFR
Anion gap: 14 (ref 5–15)
BUN: 16 mg/dL (ref 8–23)
CO2: 22 mmol/L (ref 22–32)
Calcium: 8.5 mg/dL — ABNORMAL LOW (ref 8.9–10.3)
Chloride: 103 mmol/L (ref 98–111)
Creatinine, Ser: 0.97 mg/dL (ref 0.61–1.24)
GFR, Estimated: >60 mL/min (ref >60)
Glucose, Bld: 199 mg/dL — ABNORMAL HIGH (ref 70–99)
Potassium: 4.7 mmol/L (ref 3.5–5.1)
Sodium: 139 mmol/L (ref 135–145)

## 2023-11-30 NOTE — Discharge Summary (Signed)
 Physician Discharge Summary  Austin Taylor FMW:[id number] DOB: 10/23/1940 DOA: 11/29/2023  PCP: Alben Therisa KANDICE, PA  Admit date: 11/29/2023 Discharge date:  11/30/2023   Recommendations for Outpatient Follow-up:   (include homehealth, outpatient follow-up instructions, specific recommendations for PCP to follow-up on, etc.)   Follow-up Information     Cammi Consalvo, Herlene Righter, MD Follow up on 12/23/2023.   Specialty: General Surgery Contact information: [address] [place] [address] [phone]                Discharge Diagnoses:  Principal Problem:   Hiatal hernia   Surgical Procedure: robotic hiatal hernia repair with g tube insertion  Discharge Condition: Good Disposition: Home  Diet recommendation: liquid diet for 2 weeks   Hospital Course:  83 yo male presented for hiatal hernia repair Post op he was admitted to the floor He had good pain control. He tolerated liquids and was discharged home POD 1.  Discharge Instructions  Discharge Instructions     Diet - low sodium heart healthy   Complete by: As directed    Discharge wound care:   Complete by: As directed    Shower normal tomorrow. Glue to stay on for 10-14 days. No bandage needed. Keep drain clamped, call office for instruction if unable to tolerate food   Increase activity slowly   Complete by: As directed       Allergies as of 11/30/2023       Reactions   Codeine Nausea Only           Medication List     TAKE these medications    acetaminophen  650 MG CR tablet Commonly known as: TYLENOL  Take 1,300 mg by mouth every 8 (eight) hours as needed for pain.   Eliquis  5 MG Tabs tablet Generic drug: apixaban  TAKE 1 TABLET BY MOUTH TWICE DAILY   ferrous sulfate 325 (65 FE) MG tablet Take 325 mg by mouth every Monday, Wednesday, and Friday.   furosemide  40 MG tablet Commonly known as: LASIX  Take 1 tablet (40 mg total) by mouth daily.   NON FORMULARY Pt uses a  cpap nightly   omeprazole  20 MG capsule Commonly known as: PRILOSEC Take 1 capsule (20 mg total) by mouth daily.   PROBIOTIC PO Take 1 tablet by mouth every evening.   sildenafil 100 MG tablet Commonly known as: VIAGRA Take 25-50 mg by mouth as needed for erectile dysfunction.   simvastatin  40 MG tablet Commonly known as: ZOCOR  Take 1 tablet (40 mg total) by mouth every evening.               Discharge Care Instructions  (From admission, onward)           Start     Ordered   11/30/23 0000  Discharge wound care:       Comments: Shower normal tomorrow. Glue to stay on for 10-14 days. No bandage needed. Keep drain clamped, call office for instruction if unable to tolerate food   11/30/23 0946            Follow-up Information     Quetzally Callas, Herlene Righter, MD Follow up on 12/23/2023.   Specialty: General Surgery Contact information: [address] [place] [address] [phone]                  The results of significant diagnostics from this hospitalization (including imaging, microbiology, ancillary and laboratory) are listed below for reference.  Significant Diagnostic Studies: No results found.  Labs: Basic Metabolic Panel: Recent Labs  Lab 11/29/23 2246 11/30/23 0426  NA  --  139  K  --  4.7  CL  --  103  CO2  --  22  GLUCOSE  --  199*  BUN  --  16  CREATININE 1.00 0.97  CALCIUM   --  8.5*   Liver Function Tests: No results for input(s): AST, ALT, ALKPHOS, BILITOT, PROT, ALBUMIN in the last 168 hours.  CBC: Recent Labs  Lab 11/29/23 2246 11/30/23 0426  WBC 15.9* 13.4*  HGB 14.1 14.0  HCT 45.2 42.7  MCV 94.2 92.2  PLT 238 212    CBG: No results for input(s): GLUCAP in the last 168 hours.  Principal Problem:   Hiatal hernia   Time coordinating discharge: 15 min

## 2023-11-30 NOTE — Evaluation (Signed)
 Physical Therapy Evaluation Patient Details Name: Austin Taylor MRN: [id number] DOB: 01/05/1941 Today's Date: 11/30/2023  History of Present Illness  83 yo male s/p robotic paraesophageal hernia repair, laparoscopic placement of g tube on 11/29/23. PMH: R cerebellar infarct  Clinical Impression  Patient evaluated by Physical Therapy with no further acute PT needs identified. All education has been completed and the patient has no further questions.  Pt doing well, agreeable to PT. Amb~ 140' without device, no assist, no LOB. Reviewed progression of activity at home, use of RW if needed should he feel less stable or have incr abd pain. Pt and wife verbalize understanding. No further needs at this time   See below for any follow-up Physical Therapy or equipment needs. PT is signing off. Thank you for this referral.         If plan is discharge home, recommend the following: Assist for transportation   Can travel by private vehicle        Equipment Recommendations None recommended by PT  Recommendations for Other Services       Functional Status Assessment Patient has not had a recent decline in their functional status     Precautions / Restrictions Precautions Precautions: None Restrictions Weight Bearing Restrictions Per Provider Order: No      Mobility  Bed Mobility Overal bed mobility: Modified Independent Bed Mobility: Supine to Sit     Supine to sit: HOB elevated, Modified independent (Device/Increase time)          Transfers Overall transfer level: Modified independent Equipment used: None                    Ambulation/Gait Ambulation/Gait assistance: Supervision, Modified independent (Device/Increase time)--Ind Gait Distance (Feet): 140 Feet Assistive device: None   Gait velocity: decr but functional     General Gait Details: no physical assist, no LOB. denies dizziness or pain during amb  Stairs            Wheelchair Mobility      Tilt Bed    Modified Rankin (Stroke Patients Only)       Balance Overall balance assessment: No apparent balance deficits (not formally assessed) (denies falls)                                           Pertinent Vitals/Pain Pain Assessment Pain Assessment: No/denies pain    Home Living Family/patient expects to be discharged to:: Private residence Living Arrangements: Spouse/significant other Available Help at Discharge: Family;Available 24 hours/day Type of Home: House Home Access: Stairs to enter Entrance Stairs-Rails: None Entrance Stairs-Number of Steps: 2   Home Layout: One level Home Equipment: Agricultural consultant (2 wheels)      Prior Function Prior Level of Function : Independent/Modified Independent                     Extremity/Trunk Assessment   Upper Extremity Assessment Upper Extremity Assessment: Overall WFL for tasks assessed    Lower Extremity Assessment Lower Extremity Assessment: Overall WFL for tasks assessed    Cervical / Trunk Assessment Cervical / Trunk Assessment: Normal  Communication   Communication Communication: No apparent difficulties    Cognition Arousal: Alert Behavior During Therapy: WFL for tasks assessed/performed   PT - Cognitive impairments: No apparent impairments  Following commands: Intact       Cueing Cueing Techniques: Verbal cues     General Comments      Exercises     Assessment/Plan    PT Assessment Patient does not need any further PT services  PT Problem List         PT Treatment Interventions      PT Goals (Current goals can be found in the Care Plan section)  Acute Rehab PT Goals PT Goal Formulation: All assessment and education complete, DC therapy    Frequency       Co-evaluation               AM-PAC PT 6 Clicks Mobility  Outcome Measure Help needed turning from your back to your side while in a flat bed without  using bedrails?: None Help needed moving from lying on your back to sitting on the side of a flat bed without using bedrails?: None Help needed moving to and from a bed to a chair (including a wheelchair)?: None Help needed standing up from a chair using your arms (e.g., wheelchair or bedside chair)?: None Help needed to walk in hospital room?: None Help needed climbing 3-5 steps with a railing? : None 6 Click Score: 24    End of Session   Activity Tolerance: Patient tolerated treatment well Patient left: with call bell/phone within reach;in bed;with family/visitor present Nurse Communication: Mobility status      Time: 8782-8768 PT Time Calculation (min) (ACUTE ONLY): 14 min   Charges:   PT Evaluation $PT Eval Low Complexity: 1 Low   PT General Charges $$ ACUTE PT VISIT: 1 Visit         Elaya Droege, PT  Acute Rehab Dept [facility]) [phone]  11/30/2023   [facility] 11/30/2023, 12:50 PM

## 2023-11-30 NOTE — Plan of Care (Signed)

## 2023-11-30 NOTE — Plan of Care (Signed)
  Problem: Education: Goal: Knowledge of General Education information will improve Description: Including pain rating scale, medication(s)/side effects and non-pharmacologic comfort measures 11/30/2023 1229 by Alaina Dozier PARAS, RN Outcome: Adequate for Discharge 11/30/2023 0735 by Alaina Dozier PARAS, RN Outcome: Progressing   Problem: Health Behavior/Discharge Planning: Goal: Ability to manage health-related needs will improve 11/30/2023 1229 by Alaina Dozier PARAS, RN Outcome: Adequate for Discharge 11/30/2023 0735 by Alaina Dozier PARAS, RN Outcome: Progressing   Problem: Clinical Measurements: Goal: Ability to maintain clinical measurements within normal limits will improve 11/30/2023 1229 by Alaina Dozier PARAS, RN Outcome: Adequate for Discharge 11/30/2023 0735 by Alaina Dozier PARAS, RN Outcome: Progressing Goal: Will remain free from infection 11/30/2023 1229 by Alaina Dozier PARAS, RN Outcome: Adequate for Discharge 11/30/2023 0735 by Alaina Dozier PARAS, RN Outcome: Progressing Goal: Diagnostic test results will improve 11/30/2023 1229 by Alaina Dozier PARAS, RN Outcome: Adequate for Discharge 11/30/2023 0735 by Alaina Dozier PARAS, RN Outcome: Progressing Goal: Respiratory complications will improve Outcome: Adequate for Discharge Goal: Cardiovascular complication will be avoided Outcome: Adequate for Discharge   Problem: Activity: Goal: Risk for activity intolerance will decrease Outcome: Adequate for Discharge   Problem: Nutrition: Goal: Adequate nutrition will be maintained Outcome: Adequate for Discharge   Problem: Coping: Goal: Level of anxiety will decrease Outcome: Adequate for Discharge   Problem: Elimination: Goal: Will not experience complications related to bowel motility Outcome: Adequate for Discharge Goal: Will not experience complications related to urinary retention Outcome: Adequate for Discharge   Problem: Pain  Managment: Goal: General experience of comfort will improve and/or be controlled Outcome: Adequate for Discharge   Problem: Safety: Goal: Ability to remain free from injury will improve Outcome: Adequate for Discharge   Problem: Skin Integrity: Goal: Risk for impaired skin integrity will decrease Outcome: Adequate for Discharge

## 2023-11-30 NOTE — Discharge Instructions (Signed)

## 2023-11-30 NOTE — Plan of Care (Signed)

## 2023-11-30 NOTE — Progress Notes (Signed)
 IVs removed, instructions reviewed with understanding verbalized, dressed, belongings packed, transferred to front entrance via wheelchair.

## 2023-11-30 NOTE — Progress Notes (Signed)
   11/30/23 1036  TOC Brief Assessment  Insurance and Status Reviewed  Patient has primary care physician Yes  Home environment has been reviewed resides in private residence  Prior level of function: Independent  Prior/Current Home Services No current home services  Social Drivers of Health Review SDOH reviewed no interventions necessary  Readmission risk has been reviewed Yes  Transition of care needs no transition of care needs at this time

## 2023-12-10 ENCOUNTER — Other Ambulatory Visit: Payer: Self-pay | Admitting: Student

## 2023-12-10 ENCOUNTER — Other Ambulatory Visit: Payer: Self-pay | Admitting: Internal Medicine

## 2023-12-10 DIAGNOSIS — E7849 Other hyperlipidemia: Secondary | ICD-10-CM

## 2023-12-10 DIAGNOSIS — I480 Paroxysmal atrial fibrillation: Secondary | ICD-10-CM

## 2023-12-10 DIAGNOSIS — I5033 Acute on chronic diastolic (congestive) heart failure: Secondary | ICD-10-CM

## 2023-12-10 DIAGNOSIS — E785 Hyperlipidemia, unspecified: Secondary | ICD-10-CM

## 2023-12-10 DIAGNOSIS — I5032 Chronic diastolic (congestive) heart failure: Secondary | ICD-10-CM

## 2023-12-10 NOTE — Telephone Encounter (Signed)
 Eliquis  5mg  refill request received. Patient is 83 years old, weight-102.1kg, Crea-0.97 on 11/30/23, Diagnosis-Afib, and last seen by on Josefa Beauvais on 10/19/23. Dose is appropriate based on dosing criteria. Will send in refill to requested pharmacy.

## 2023-12-16 NOTE — Progress Notes (Signed)
   PROVIDER:  HERLENE BEVERLEY BUREAU, MD DATE OF ENCOUNTER: 12/16/2023 Interval History:  He is tolerating liquids and mushy foods like grits and potatoes. His pain has been well controlled. He has not needed to use the g tube. Physical Examination:  Physical Exam Constitutional:      Appearance: Normal appearance.  HENT:     Head: Normocephalic and atraumatic.  Pulmonary:     Effort: Pulmonary effort is normal.  Abdominal:     Comments: Incisions well healed, g tube removed during examination  Musculoskeletal:        General: Normal range of motion.     Cervical back: Normal range of motion.  Neurological:     General: No focal deficit present.     Mental Status: He is alert and oriented to person, place, and time. Mental status is at baseline.  Psychiatric:        Mood and Affect: Mood normal.        Behavior: Behavior normal.        Thought Content: Thought content normal.      Assessment and Plan:  Austin Taylor is a 83 y.o. male who underwent hiatal hernia repair with g tube insertion 3 weeks ago.  Diagnoses and all orders for this visit:  Paroxysmal atrial fibrillation (CMS/HHS-HCC)  Paraesophageal hiatal hernia    Assessment & Plan  Patient can resume all activities. Follow up in 6 weeks Continue to advance diet to soft foods and then regular foods  The plan was discussed in detail with the patient today, who expressed understanding.  The patient has my contact information, and understands to call me with any additional questions or concerns in the interval.  I would be happy to see the patient back sooner if the need arises.   HERLENE BEVERLEY BUREAU, MD

## 2023-12-31 ENCOUNTER — Ambulatory Visit: Payer: Medicare Other

## 2024-01-06 ENCOUNTER — Ambulatory Visit (HOSPITAL_COMMUNITY)
Admission: RE | Admit: 2024-01-06 | Discharge: 2024-01-06 | Disposition: A | Discharge status: Home / Self Care | Source: Ambulatory Visit | Attending: Physician Assistant | Admitting: Physician Assistant

## 2024-01-06 VITALS — BP 122/70 | HR 56 | Ht 71.0 in | Wt 229.8 lb

## 2024-01-06 DIAGNOSIS — I4819 Other persistent atrial fibrillation: Secondary | ICD-10-CM

## 2024-01-06 DIAGNOSIS — D6869 Other thrombophilia: Secondary | ICD-10-CM

## 2024-01-06 DIAGNOSIS — I4891 Unspecified atrial fibrillation: Secondary | ICD-10-CM

## 2024-01-06 NOTE — Progress Notes (Signed)
 Primary Care Physician: Alben Therisa MATSU, PA Primary Cardiologist: Vina Gull, MD Electrophysiologist: Will Gladis Norton, MD  Referring Physician: Dr Norton Elsie MATSU Austin Taylor is a 83 y.o. male with a history of OSA, HLD, CVA, Mobitz I AV block, atrial fibrillation who presents for follow up in the [facility].  The patient was initially diagnosed with atrial fibrillation remotely and was loaded on dofetilide . He is s/p afib ablation 08/25/16 with Dr Kelsie, dofetilide  later discontinued. Patient is on Eliquis  for stroke prevention.    Patient presents today for follow up for atrial fibrillation. He is in SR today and feels well. He denies any interim symptoms of afib. He has a [employer], dispensing mobile which has shown only SR. No bleeding issues on anticoagulation.   Today, he denies symptoms of palpitations, chest pain, shortness of breath, orthopnea, PND, lower extremity edema, dizziness, presyncope, syncope, bleeding, or neurologic sequela. The patient is tolerating medications without difficulties and is otherwise without complaint today.    Atrial Fibrillation Risk Factors:  he does have symptoms or diagnosis of sleep apnea. he does not have a history of rheumatic fever.   Atrial Fibrillation Management history:  Previous antiarrhythmic drugs: dofetilide  Previous cardioversions: 2017 x 2 Previous ablations: 08/25/16 Anticoagulation history: Eliquis   ROS- All systems are reviewed and negative except as per the HPI above.  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 (Patient taking differently: Take 1,300 mg by mouth as needed for pain.)     apixaban  (ELIQUIS ) 5 MG TABS tablet TAKE 1 TABLET BY MOUTH TWICE DAILY 180 tablet 2   ferrous sulfate 325 (65 FE) MG tablet Take 325 mg by mouth every Monday, Wednesday, and Friday.      furosemide  (LASIX ) 40 MG tablet TAKE 1 TABLET BY MOUTH DAILY 90 tablet 3   NON FORMULARY Pt uses a cpap nightly     omeprazole  (PRILOSEC) 20 MG capsule Take 1 capsule (20 mg total) by mouth daily. 30 capsule 1   Probiotic Product (PROBIOTIC PO) Take 1 tablet by mouth every evening.     sildenafil (VIAGRA) 100 MG tablet Take 25-50 mg by mouth as needed for erectile dysfunction.     simvastatin  (ZOCOR ) 40 MG tablet Take 1 tablet (40 mg total) by mouth every evening. 90 tablet 3   No current facility-administered medications for this encounter.    Physical Exam: BP 122/70   Pulse (!) 56   Ht 5' 11 (1.803 m)   Wt 104.2 kg   BMI 32.05 kg/m   GEN: Well nourished, well developed in no acute distress CARDIAC: Regular rate and rhythm, no murmurs, rubs, gallops RESPIRATORY:  Clear to auscultation without rales, wheezing or rhonchi  ABDOMEN: Soft, non-tender, non-distended  EXTREMITIES:  No edema; No deformity   Wt Readings from Last 3 Encounters:  01/06/24 104.2 kg  11/17/23 102.1 kg  09/16/23 104.3 kg     EKG today demonstrates  SR, 1st degree AV block Vent. rate 56 BPM PR interval 300 ms QRS duration 90 ms QT/QTcB 464/447 ms   Echo 11/10/22 demonstrated   1. Left ventricular ejection fraction, by estimation, is 70 to 75%. The  left ventricle has hyperdynamic function. The left ventricle has no  regional wall motion abnormalities. There is moderate asymmetric left  ventricular hypertrophy of the septal segment.  Left ventricular diastolic parameters are indeterminate.   2. Right ventricular systolic function is hyperdynamic. The right  ventricular size is normal.   3. The mitral valve is grossly normal. Trivial mitral valve  regurgitation. No evidence of mitral stenosis.   4. The aortic valve is tricuspid. There is mild thickening of the aortic  valve. Aortic valve regurgitation is not visualized. No aortic stenosis is  present.   5. The inferior vena cava is dilated in size with >50% respiratory  variability, suggesting right atrial pressure of 8 mmHg.    CHA2DS2-VASc Score = 4  The patient's score is based upon: CHF History: 0 HTN History: 0 Diabetes History: 0 Stroke History: 2 Vascular Disease History: 0 Age Score: 2 Gender Score: 0       ASSESSMENT AND PLAN: Persistent Atrial Fibrillation (ICD10:  I48.19) The patient's CHA2DS2-VASc score is 4, indicating a 4.8% annual risk of stroke.   S/p afib ablation 2021 with Dr Kelsie Patient appears to be maintaining SR.  Continue Eliquis  5 mg BID Kardia mobile for home monitoring. ILR at RRT  Secondary Hypercoagulable State (ICD10:  [phone]) The patient is at significant risk for stroke/thromboembolism based upon his CHA2DS2-VASc Score of 4.  Continue Apixaban  (Eliquis ). No bleeding issues.   CAD/aortic atherosclerosis Noted on CT No anginal symptoms Followed by Dr Okey  OSA  Encouraged nightly CPAP  Mobitz I AV block Asymptomatic, followed by Dr Inocencio   Follow up with Dr Inocencio in 6 months.    [facility] [address] [place], [place] [address] [phone]

## 2024-02-04 ENCOUNTER — Ambulatory Visit: Payer: Medicare Other

## 2024-03-10 ENCOUNTER — Ambulatory Visit: Payer: Medicare Other

## 2024-04-07 ENCOUNTER — Ambulatory Visit: Attending: Cardiology | Admitting: Cardiology

## 2024-04-07 VITALS — BP 148/80 | HR 59 | Ht 71.0 in | Wt 227.0 lb

## 2024-04-07 DIAGNOSIS — I480 Paroxysmal atrial fibrillation: Secondary | ICD-10-CM

## 2024-04-07 DIAGNOSIS — E7800 Pure hypercholesterolemia, unspecified: Secondary | ICD-10-CM

## 2024-04-07 DIAGNOSIS — I5032 Chronic diastolic (congestive) heart failure: Secondary | ICD-10-CM

## 2024-04-07 NOTE — Progress Notes (Signed)
 " Cardiology Office Note:  .   Date:  04/07/2024  ID:  SIONE BAUMGARTEN, DOB 1940/08/18, MRN [id number] PCP: Alben Therisa KANDICE, PA  [facility] Providers Cardiologist:  Oneil Parchment, MD Cardiology APP:  Lelon Glendia DASEN, PA-C  Electrophysiologist:  Will Gladis Norton, MD     History of Present Illness: .   Austin Taylor is a 84 y.o. male Discussed the use of AI scribe   History of Present Illness Rajohn Henery is an 84 year old male with atrial fibrillation and coronary artery disease who presents for follow-up.  Atrial fibrillation and anticoagulation - History of atrial fibrillation, status post ablation in June 2018 - On Eliquis  5 mg twice daily since a small stroke in 2015 attributed to atrial fibrillation - Implantable loop recorder inactive due to battery depletion - Occasional unclassified episodes suspected to be atrial fibrillation  Coronary artery disease and cardiac function - History of coronary artery calcification - Echocardiogram on November 10, 2022 showed ejection fraction of 75% with hyperdynamic function and trivial mitral regurgitation  Mobitz type i av block - History of Mobitz type I AV block  Peripheral edema and diuretic therapy - On furosemide  40 mg for fluid retention - Since hiatal hernia surgery, decreased urinary frequency, attributed to possible tolerance to furosemide  - Ankle swelling present, using compression socks for the past few weeks with perceived benefit  Obstructive sleep apnea - Uses CPAP machine for obstructive sleep apnea  Prior dehydration episode - Several years ago experienced dizziness and diaphoresis, required ambulance transport and was treated for dehydration - Attributed episode to inadequate fluid intake and is now cautious to avoid recurrence  Recent laboratory findings - Recent creatinine 0.97 - Recent potassium 4.7  Psychological stress - Experienced stress related to waiting for appointment and  accompanying his wife for her procedure earlier in the day     Studies Reviewed: .        Results Labs Creatinine: 0.97 Potassium: 4.7  Diagnostic Echocardiogram (11/10/2022): Ejection fraction 75%, hyperdynamic, trivial mitral regurgitation Risk Assessment/Calculations:           Physical Exam:   VS:  BP (!) 148/80 (BP Location: Left Arm, Patient Position: Sitting, Cuff Size: Normal)   Pulse (!) 59   Ht 5' 11 (1.803 m)   Wt 227 lb (103 kg)   SpO2 97%   BMI 31.66 kg/m    Wt Readings from Last 3 Encounters:  04/07/24 227 lb (103 kg)  01/06/24 229 lb 12.8 oz (104.2 kg)  11/17/23 225 lb (102.1 kg)    GEN: Well nourished, well developed in no acute distress NECK: No JVD; No carotid bruits CARDIAC: RRR, no murmurs, no rubs, no gallops RESPIRATORY:  Clear to auscultation without rales, wheezing or rhonchi  ABDOMEN: Soft, non-tender, non-distended EXTREMITIES:  No edema; No deformity   ASSESSMENT AND PLAN: .    Assessment and Plan Assessment & Plan Atrial fibrillation status post ablation Atrial fibrillation status post ablation in 2018. Currently on Eliquis  5 mg twice daily. No recent episodes of atrial fibrillation detected by loop recorder. Reports issues with current monitoring device, suggesting possible need for replacement. - Continue Eliquis  5 mg twice daily - Will consider replacement of monitoring device if issues persist  Second degree AV block, Mobitz type I Second degree AV block, Mobitz type I. No current symptoms or arrhythmias detected.  Coronary artery disease Coronary artery disease. Currently on simvastatin  40 mg daily. - Continue simvastatin  40 mg daily  Volume overload with lower extremity edema Volume overload with lower extremity edema. Reports ankle swelling and decreased diuretic effect post-hiatal hernia surgery. Currently on furosemide  40 mg daily. Discussed increasing furosemide  to 80 mg daily to manage fluid overload, with caution due to  past dehydration episode. Alternative plan to increase furosemide  temporarily to 80 mg for 2-3 days to assess fluid removal, then return to 40 mg daily. - Increase furosemide  to 80 mg daily for 2-3 days, then return to 40 mg daily - Monitor for signs of dehydration or dizziness - Weigh regularly to assess fluid status  History of ischemic stroke Ischemic stroke in 2015, likely related to atrial fibrillation. Currently on Eliquis  for stroke prevention. - Continue Eliquis  5 mg twice daily         Dispo: 1 yr  Signed, Oneil Parchment, MD  "

## 2024-04-07 NOTE — Patient Instructions (Signed)
 Medication Instructions:  The current medical regimen is effective;  continue present plan and medications.  *If you need a refill on your cardiac medications before your next appointment, please call your pharmacy*  Follow-Up: At [facility], you and your health needs are our priority.  As part of our continuing mission to provide you with exceptional heart care, our providers are all part of one team.  This team includes your primary Cardiologist (physician) and Advanced Practice Providers or APPs (Physician Assistants and Nurse Practitioners) who all work together to provide you with the care you need, when you need it.  Your next appointment:   1 year(s)  Provider:   Dr Jeffrie     We recommend signing up for the patient portal called MyChart.  Sign up information is provided on this After Visit Summary.  MyChart is used to connect with patients for Virtual Visits (Telemedicine).  Patients are able to view lab/test results, encounter notes, upcoming appointments, etc.  Non-urgent messages can be sent to your provider as well.   To learn more about what you can do with MyChart, go to [email or web address].

## 2024-04-10 ENCOUNTER — Ambulatory Visit: Admitting: Cardiology
